# Patient Record
Sex: Male | Born: 1970
Health system: Southern US, Community
[De-identification: ages and names within clinical notes are randomized; demographics above are authoritative.]

## PROBLEM LIST (undated history)

## (undated) DIAGNOSIS — Z9114 Patient's other noncompliance with medication regimen: Secondary | ICD-10-CM

## (undated) DIAGNOSIS — B1920 Unspecified viral hepatitis C without hepatic coma: Secondary | ICD-10-CM

## (undated) DIAGNOSIS — F319 Bipolar disorder, unspecified: Secondary | ICD-10-CM

## (undated) DIAGNOSIS — F419 Anxiety disorder, unspecified: Secondary | ICD-10-CM

## (undated) DIAGNOSIS — F32A Depression, unspecified: Secondary | ICD-10-CM

## (undated) DIAGNOSIS — K219 Gastro-esophageal reflux disease without esophagitis: Secondary | ICD-10-CM

## (undated) DIAGNOSIS — R000 Tachycardia, unspecified: Secondary | ICD-10-CM

## (undated) DIAGNOSIS — Z91148 Patient's other noncompliance with medication regimen for other reason: Secondary | ICD-10-CM

## (undated) DIAGNOSIS — R569 Unspecified convulsions: Secondary | ICD-10-CM

## (undated) DIAGNOSIS — K529 Noninfective gastroenteritis and colitis, unspecified: Secondary | ICD-10-CM

## (undated) DIAGNOSIS — B86 Scabies: Secondary | ICD-10-CM

## (undated) DIAGNOSIS — F329 Major depressive disorder, single episode, unspecified: Secondary | ICD-10-CM

## (undated) DIAGNOSIS — F1910 Other psychoactive substance abuse, uncomplicated: Secondary | ICD-10-CM

## (undated) DIAGNOSIS — G4733 Obstructive sleep apnea (adult) (pediatric): Secondary | ICD-10-CM

## (undated) HISTORY — DX: Unspecified viral hepatitis C without hepatic coma: B19.20

## (undated) HISTORY — PX: NECK SURGERY: SHX720

## (undated) HISTORY — DX: Bipolar disorder, unspecified: F31.9

## (undated) HISTORY — PX: BURN TREATMENT: SHX158

## (undated) HISTORY — PX: SHOULDER ARTHROSCOPY: SHX128

## (undated) HISTORY — PX: OTHER SURGICAL HISTORY: SHX169

## (undated) HISTORY — DX: Scabies: B86

## (undated) HISTORY — DX: Obstructive sleep apnea (adult) (pediatric): G47.33

## (undated) HISTORY — PX: SHOULDER SURGERY: SHX246

---

## 2000-12-06 ENCOUNTER — Emergency Department (HOSPITAL_COMMUNITY): Admission: EM | Admit: 2000-12-06 | Discharge: 2000-12-06 | Payer: Self-pay | Admitting: Emergency Medicine

## 2001-12-01 ENCOUNTER — Ambulatory Visit (HOSPITAL_COMMUNITY): Admission: RE | Admit: 2001-12-01 | Discharge: 2001-12-01 | Payer: Self-pay | Admitting: Pediatrics

## 2001-12-23 ENCOUNTER — Other Ambulatory Visit: Admission: RE | Admit: 2001-12-23 | Discharge: 2001-12-23 | Payer: Self-pay | Admitting: *Deleted

## 2001-12-23 ENCOUNTER — Encounter (INDEPENDENT_AMBULATORY_CARE_PROVIDER_SITE_OTHER): Payer: Self-pay | Admitting: Specialist

## 2003-06-01 ENCOUNTER — Encounter: Payer: Self-pay | Admitting: *Deleted

## 2003-06-01 ENCOUNTER — Emergency Department (HOSPITAL_COMMUNITY): Admission: EM | Admit: 2003-06-01 | Discharge: 2003-06-02 | Payer: Self-pay | Admitting: *Deleted

## 2004-01-06 ENCOUNTER — Ambulatory Visit (HOSPITAL_COMMUNITY): Admission: RE | Admit: 2004-01-06 | Discharge: 2004-01-06 | Payer: Self-pay | Admitting: Internal Medicine

## 2004-02-15 ENCOUNTER — Inpatient Hospital Stay (HOSPITAL_COMMUNITY): Admission: RE | Admit: 2004-02-15 | Discharge: 2004-02-19 | Payer: Self-pay | Admitting: Psychiatry

## 2004-02-20 ENCOUNTER — Other Ambulatory Visit (HOSPITAL_COMMUNITY): Admission: RE | Admit: 2004-02-20 | Discharge: 2004-02-29 | Payer: Self-pay | Admitting: Psychiatry

## 2004-03-22 ENCOUNTER — Emergency Department (HOSPITAL_COMMUNITY): Admission: EM | Admit: 2004-03-22 | Discharge: 2004-03-22 | Payer: Self-pay | Admitting: Emergency Medicine

## 2004-10-04 ENCOUNTER — Emergency Department (HOSPITAL_COMMUNITY): Admission: EM | Admit: 2004-10-04 | Discharge: 2004-10-05 | Payer: Self-pay | Admitting: Emergency Medicine

## 2005-05-08 ENCOUNTER — Emergency Department (HOSPITAL_COMMUNITY): Admission: EM | Admit: 2005-05-08 | Discharge: 2005-05-08 | Payer: Self-pay | Admitting: Emergency Medicine

## 2005-05-28 ENCOUNTER — Emergency Department (HOSPITAL_COMMUNITY): Admission: EM | Admit: 2005-05-28 | Discharge: 2005-05-28 | Payer: Self-pay | Admitting: Emergency Medicine

## 2005-11-10 ENCOUNTER — Emergency Department (HOSPITAL_COMMUNITY): Admission: EM | Admit: 2005-11-10 | Discharge: 2005-11-10 | Payer: Self-pay | Admitting: Emergency Medicine

## 2006-04-26 ENCOUNTER — Emergency Department (HOSPITAL_COMMUNITY): Admission: EM | Admit: 2006-04-26 | Discharge: 2006-04-26 | Payer: Self-pay | Admitting: Emergency Medicine

## 2006-08-18 ENCOUNTER — Emergency Department (HOSPITAL_COMMUNITY): Admission: EM | Admit: 2006-08-18 | Discharge: 2006-08-18 | Payer: Self-pay | Admitting: Emergency Medicine

## 2007-03-04 ENCOUNTER — Emergency Department (HOSPITAL_COMMUNITY): Admission: EM | Admit: 2007-03-04 | Discharge: 2007-03-04 | Payer: Self-pay | Admitting: Emergency Medicine

## 2008-03-16 ENCOUNTER — Emergency Department (HOSPITAL_COMMUNITY): Admission: EM | Admit: 2008-03-16 | Discharge: 2008-03-17 | Payer: Self-pay | Admitting: Emergency Medicine

## 2008-03-16 ENCOUNTER — Encounter: Payer: Self-pay | Admitting: Orthopedic Surgery

## 2008-03-30 ENCOUNTER — Ambulatory Visit: Payer: Self-pay | Admitting: Orthopedic Surgery

## 2008-03-30 DIAGNOSIS — M25519 Pain in unspecified shoulder: Secondary | ICD-10-CM | POA: Insufficient documentation

## 2008-05-13 ENCOUNTER — Emergency Department (HOSPITAL_COMMUNITY): Admission: EM | Admit: 2008-05-13 | Discharge: 2008-05-13 | Payer: Self-pay | Admitting: Emergency Medicine

## 2008-05-20 ENCOUNTER — Emergency Department (HOSPITAL_COMMUNITY): Admission: EM | Admit: 2008-05-20 | Discharge: 2008-05-20 | Payer: Self-pay | Admitting: Emergency Medicine

## 2008-08-05 ENCOUNTER — Ambulatory Visit (HOSPITAL_COMMUNITY): Admission: RE | Admit: 2008-08-05 | Discharge: 2008-08-05 | Payer: Self-pay | Admitting: Orthopaedic Surgery

## 2008-09-06 ENCOUNTER — Ambulatory Visit (HOSPITAL_COMMUNITY): Admission: RE | Admit: 2008-09-06 | Discharge: 2008-09-06 | Payer: Self-pay | Admitting: Orthopaedic Surgery

## 2008-11-15 ENCOUNTER — Emergency Department (HOSPITAL_COMMUNITY): Admission: EM | Admit: 2008-11-15 | Discharge: 2008-11-15 | Payer: Self-pay | Admitting: Emergency Medicine

## 2009-01-01 ENCOUNTER — Emergency Department (HOSPITAL_COMMUNITY): Admission: EM | Admit: 2009-01-01 | Discharge: 2009-01-01 | Payer: Self-pay | Admitting: Emergency Medicine

## 2009-04-16 ENCOUNTER — Emergency Department (HOSPITAL_COMMUNITY): Admission: EM | Admit: 2009-04-16 | Discharge: 2009-04-16 | Payer: Self-pay | Admitting: Emergency Medicine

## 2010-05-15 ENCOUNTER — Emergency Department (HOSPITAL_COMMUNITY)
Admission: EM | Admit: 2010-05-15 | Discharge: 2010-05-15 | Payer: Self-pay | Source: Home / Self Care | Admitting: Emergency Medicine

## 2010-11-13 ENCOUNTER — Ambulatory Visit (HOSPITAL_COMMUNITY)
Admission: RE | Admit: 2010-11-13 | Discharge: 2010-11-13 | Payer: Self-pay | Source: Home / Self Care | Attending: Family Medicine | Admitting: Family Medicine

## 2011-01-06 ENCOUNTER — Inpatient Hospital Stay (HOSPITAL_COMMUNITY)
Admission: EM | Admit: 2011-01-06 | Discharge: 2011-01-15 | DRG: 918 | Disposition: A | Discharge status: Other Acute Inpatient Hospital | Payer: MEDICARE | Attending: Internal Medicine | Admitting: Internal Medicine

## 2011-01-06 ENCOUNTER — Emergency Department (HOSPITAL_COMMUNITY): Payer: MEDICARE

## 2011-01-06 DIAGNOSIS — F17200 Nicotine dependence, unspecified, uncomplicated: Secondary | ICD-10-CM | POA: Diagnosis present

## 2011-01-06 DIAGNOSIS — F319 Bipolar disorder, unspecified: Secondary | ICD-10-CM | POA: Diagnosis present

## 2011-01-06 DIAGNOSIS — M5020 Other cervical disc displacement, unspecified cervical region: Secondary | ICD-10-CM | POA: Diagnosis present

## 2011-01-06 DIAGNOSIS — G40909 Epilepsy, unspecified, not intractable, without status epilepticus: Secondary | ICD-10-CM | POA: Diagnosis present

## 2011-01-06 DIAGNOSIS — T426X1A Poisoning by other antiepileptic and sedative-hypnotic drugs, accidental (unintentional), initial encounter: Principal | ICD-10-CM | POA: Diagnosis present

## 2011-01-06 DIAGNOSIS — R269 Unspecified abnormalities of gait and mobility: Secondary | ICD-10-CM | POA: Diagnosis present

## 2011-01-06 DIAGNOSIS — E870 Hyperosmolality and hypernatremia: Secondary | ICD-10-CM | POA: Diagnosis not present

## 2011-01-06 DIAGNOSIS — D6959 Other secondary thrombocytopenia: Secondary | ICD-10-CM | POA: Diagnosis present

## 2011-01-06 LAB — COMPREHENSIVE METABOLIC PANEL
ALT: 98 U/L — ABNORMAL HIGH (ref 0–53)
AST: 62 U/L — ABNORMAL HIGH (ref 0–37)
Albumin: 3.5 g/dL (ref 3.5–5.2)
Alkaline Phosphatase: 60 U/L (ref 39–117)
BUN: 11 mg/dL (ref 6–23)
Calcium: 7.8 mg/dL — ABNORMAL LOW (ref 8.4–10.5)
Chloride: 108 mEq/L (ref 96–112)
Creatinine, Ser: 1.31 mg/dL (ref 0.4–1.5)
Glucose, Bld: 84 mg/dL (ref 70–99)
Sodium: 141 mEq/L (ref 135–145)
Total Bilirubin: 0.8 mg/dL (ref 0.3–1.2)
Total Protein: 6.2 g/dL (ref 6.0–8.3)

## 2011-01-06 LAB — CBC
MCH: 27.7 pg (ref 26.0–34.0)
MCV: 82.3 fL (ref 78.0–100.0)
RBC: 4.29 MIL/uL (ref 4.22–5.81)
WBC: 1.8 10*3/uL — ABNORMAL LOW (ref 4.0–10.5)

## 2011-01-06 LAB — DIFFERENTIAL
Basophils Absolute: 0 10*3/uL (ref 0.0–0.1)
Basophils Relative: 1 % (ref 0–1)
Lymphocytes Relative: 45 % (ref 12–46)
Lymphs Abs: 0.8 10*3/uL (ref 0.7–4.0)
Monocytes Absolute: 0 10*3/uL — ABNORMAL LOW (ref 0.1–1.0)
Monocytes Relative: 2 % — ABNORMAL LOW (ref 3–12)

## 2011-01-07 ENCOUNTER — Inpatient Hospital Stay (HOSPITAL_COMMUNITY): Payer: MEDICARE

## 2011-01-07 LAB — COMPREHENSIVE METABOLIC PANEL
Albumin: 3.1 g/dL — ABNORMAL LOW (ref 3.5–5.2)
BUN: 11 mg/dL (ref 6–23)
CO2: 22 mEq/L (ref 19–32)
Chloride: 115 mEq/L — ABNORMAL HIGH (ref 96–112)
GFR calc Af Amer: >60 mL/min (ref >60)
GFR calc non Af Amer: >60 mL/min (ref >60)
Glucose, Bld: 97 mg/dL (ref 70–99)
Potassium: 3.5 mEq/L (ref 3.5–5.1)
Sodium: 149 mEq/L — ABNORMAL HIGH (ref 135–145)
Total Protein: 5.6 g/dL — ABNORMAL LOW (ref 6.0–8.3)

## 2011-01-07 LAB — AMMONIA: Ammonia: 110 umol/L — ABNORMAL HIGH (ref 11–35)

## 2011-01-07 LAB — DIFFERENTIAL
Basophils Absolute: 0 10*3/uL (ref 0.0–0.1)
Basophils Relative: 0 % (ref 0–1)
Eosinophils Absolute: 0 10*3/uL (ref 0.0–0.7)
Eosinophils Relative: 0 % (ref 0–5)
Lymphs Abs: 0.7 10*3/uL (ref 0.7–4.0)

## 2011-01-07 LAB — PROTIME-INR
INR: 1.34 (ref 0.00–1.49)
Prothrombin Time: 16.8 seconds — ABNORMAL HIGH (ref 11.6–15.2)

## 2011-01-07 LAB — MRSA PCR SCREENING: MRSA by PCR: NEGATIVE

## 2011-01-07 LAB — VALPROIC ACID LEVEL
Valproic Acid Lvl: 373.5 ug/mL (ref 50.0–100.0)
Valproic Acid Lvl: 207.4 ug/mL (ref 50.0–100.0)

## 2011-01-07 LAB — CBC
Hemoglobin: 12.6 g/dL — ABNORMAL LOW (ref 13.0–17.0)
MCHC: 33.8 g/dL (ref 30.0–36.0)
RBC: 4.49 MIL/uL (ref 4.22–5.81)
WBC: 1.4 10*3/uL — CL (ref 4.0–10.5)

## 2011-01-07 LAB — TSH: TSH: 0.341 u[IU]/mL — ABNORMAL LOW (ref 0.350–4.500)

## 2011-01-08 LAB — CBC
MCV: 82.3 fL (ref 78.0–100.0)
Platelets: 103 10*3/uL — ABNORMAL LOW (ref 150–400)
RDW: 13.5 % (ref 11.5–15.5)
WBC: 1.7 10*3/uL — ABNORMAL LOW (ref 4.0–10.5)

## 2011-01-08 LAB — DIFFERENTIAL
Basophils Relative: 0 % (ref 0–1)
Eosinophils Absolute: 0 10*3/uL (ref 0.0–0.7)
Eosinophils Relative: 0 % (ref 0–5)
Lymphs Abs: 0.6 10*3/uL — ABNORMAL LOW (ref 0.7–4.0)

## 2011-01-08 LAB — COMPREHENSIVE METABOLIC PANEL
Albumin: 2.9 g/dL — ABNORMAL LOW (ref 3.5–5.2)
Alkaline Phosphatase: 46 U/L (ref 39–117)
BUN: 8 mg/dL (ref 6–23)
Creatinine, Ser: 1.03 mg/dL (ref 0.4–1.5)
Potassium: 3.1 mEq/L — ABNORMAL LOW (ref 3.5–5.1)
Total Protein: 4.9 g/dL — ABNORMAL LOW (ref 6.0–8.3)

## 2011-01-09 ENCOUNTER — Inpatient Hospital Stay (HOSPITAL_COMMUNITY): Payer: MEDICARE

## 2011-01-09 ENCOUNTER — Inpatient Hospital Stay (HOSPITAL_COMMUNITY): Admit: 2011-01-09 | Discharge: 2011-01-09 | Disposition: A | Discharge status: Home / Self Care | Payer: MEDICARE

## 2011-01-09 LAB — DIFFERENTIAL
Lymphs Abs: 1.2 10*3/uL (ref 0.7–4.0)
Monocytes Relative: 4 % (ref 3–12)
Neutro Abs: 1.9 10*3/uL (ref 1.7–7.7)
Neutrophils Relative %: 59 % (ref 43–77)

## 2011-01-09 LAB — CBC
HCT: 33.1 % — ABNORMAL LOW (ref 39.0–52.0)
Hemoglobin: 11.2 g/dL — ABNORMAL LOW (ref 13.0–17.0)
MCH: 27.8 pg (ref 26.0–34.0)
MCV: 82.1 fL (ref 78.0–100.0)
RBC: 4.03 MIL/uL — ABNORMAL LOW (ref 4.22–5.81)

## 2011-01-09 LAB — COMPREHENSIVE METABOLIC PANEL
AST: 38 U/L — ABNORMAL HIGH (ref 0–37)
Alkaline Phosphatase: 52 U/L (ref 39–117)
BUN: 15 mg/dL (ref 6–23)
CO2: 24 mEq/L (ref 19–32)
Chloride: 114 mEq/L — ABNORMAL HIGH (ref 96–112)
Creatinine, Ser: 1.08 mg/dL (ref 0.4–1.5)

## 2011-01-09 LAB — VALPROIC ACID LEVEL: Valproic Acid Lvl: 45 ug/mL — ABNORMAL LOW (ref 50.0–100.0)

## 2011-01-10 DIAGNOSIS — F39 Unspecified mood [affective] disorder: Secondary | ICD-10-CM

## 2011-01-10 LAB — DIFFERENTIAL
Eosinophils Absolute: 0 10*3/uL (ref 0.0–0.7)
Eosinophils Relative: 0 % (ref 0–5)
Lymphocytes Relative: 19 % (ref 12–46)
Lymphs Abs: 0.5 10*3/uL — ABNORMAL LOW (ref 0.7–4.0)
Monocytes Absolute: 0.1 10*3/uL (ref 0.1–1.0)

## 2011-01-10 LAB — COMPREHENSIVE METABOLIC PANEL
ALT: 72 U/L — ABNORMAL HIGH (ref 0–53)
AST: 39 U/L — ABNORMAL HIGH (ref 0–37)
Albumin: 3.2 g/dL — ABNORMAL LOW (ref 3.5–5.2)
Calcium: 9 mg/dL (ref 8.4–10.5)
Chloride: 113 mEq/L — ABNORMAL HIGH (ref 96–112)
Creatinine, Ser: 0.92 mg/dL (ref 0.4–1.5)
GFR calc Af Amer: >60 mL/min (ref >60)
Sodium: 144 mEq/L (ref 135–145)
Total Bilirubin: 0.6 mg/dL (ref 0.3–1.2)

## 2011-01-10 LAB — AMMONIA: Ammonia: 35 umol/L (ref 11–35)

## 2011-01-10 LAB — CBC
HCT: 37.7 % — ABNORMAL LOW (ref 39.0–52.0)
MCHC: 34 g/dL (ref 30.0–36.0)
MCV: 78.7 fL (ref 78.0–100.0)
RDW: 13.3 % (ref 11.5–15.5)

## 2011-01-11 LAB — BASIC METABOLIC PANEL
Chloride: 108 mEq/L (ref 96–112)
Creatinine, Ser: 0.9 mg/dL (ref 0.4–1.5)
GFR calc Af Amer: >60 mL/min (ref >60)
GFR calc non Af Amer: >60 mL/min (ref >60)
Potassium: 4.1 mEq/L (ref 3.5–5.1)

## 2011-01-11 LAB — DIFFERENTIAL
Eosinophils Relative: 0 % (ref 0–5)
Lymphocytes Relative: 13 % (ref 12–46)
Lymphs Abs: 0.8 10*3/uL (ref 0.7–4.0)
Monocytes Absolute: 0.2 10*3/uL (ref 0.1–1.0)
Monocytes Relative: 3 % (ref 3–12)

## 2011-01-11 LAB — CBC
HCT: 34.9 % — ABNORMAL LOW (ref 39.0–52.0)
MCH: 26.7 pg (ref 26.0–34.0)
MCHC: 33.8 g/dL (ref 30.0–36.0)
MCV: 79 fL (ref 78.0–100.0)
RDW: 13.2 % (ref 11.5–15.5)

## 2011-01-11 LAB — HEPATIC FUNCTION PANEL
Albumin: 3.1 g/dL — ABNORMAL LOW (ref 3.5–5.2)
Alkaline Phosphatase: 52 U/L (ref 39–117)
Total Protein: 5.8 g/dL — ABNORMAL LOW (ref 6.0–8.3)

## 2011-01-11 LAB — VALPROIC ACID LEVEL: Valproic Acid Lvl: 113 ug/mL — ABNORMAL HIGH (ref 50.0–100.0)

## 2011-01-12 LAB — DIFFERENTIAL
Basophils Absolute: 0 10*3/uL (ref 0.0–0.1)
Eosinophils Relative: 0 % (ref 0–5)
Lymphocytes Relative: 11 % — ABNORMAL LOW (ref 12–46)
Lymphs Abs: 1 10*3/uL (ref 0.7–4.0)
Neutro Abs: 8.1 10*3/uL — ABNORMAL HIGH (ref 1.7–7.7)

## 2011-01-12 LAB — COMPREHENSIVE METABOLIC PANEL
ALT: 50 U/L (ref 0–53)
Alkaline Phosphatase: 55 U/L (ref 39–117)
BUN: 18 mg/dL (ref 6–23)
CO2: 27 mEq/L (ref 19–32)
Chloride: 106 mEq/L (ref 96–112)
GFR calc non Af Amer: >60 mL/min (ref >60)
Glucose, Bld: 136 mg/dL — ABNORMAL HIGH (ref 70–99)
Potassium: 3.6 mEq/L (ref 3.5–5.1)
Sodium: 143 mEq/L (ref 135–145)
Total Bilirubin: 0.6 mg/dL (ref 0.3–1.2)

## 2011-01-12 LAB — CBC
HCT: 36.9 % — ABNORMAL LOW (ref 39.0–52.0)
Hemoglobin: 12.2 g/dL — ABNORMAL LOW (ref 13.0–17.0)
MCV: 82.2 fL (ref 78.0–100.0)
RBC: 4.49 MIL/uL (ref 4.22–5.81)
RDW: 13.1 % (ref 11.5–15.5)
WBC: 9.5 10*3/uL (ref 4.0–10.5)

## 2011-01-13 LAB — DIFFERENTIAL
Basophils Absolute: 0 10*3/uL (ref 0.0–0.1)
Basophils Relative: 0 % (ref 0–1)
Eosinophils Absolute: 0 10*3/uL (ref 0.0–0.7)
Monocytes Relative: 7 % (ref 3–12)
Neutro Abs: 7.3 10*3/uL (ref 1.7–7.7)
Neutrophils Relative %: 78 % — ABNORMAL HIGH (ref 43–77)

## 2011-01-13 LAB — CBC
Hemoglobin: 13.3 g/dL (ref 13.0–17.0)
MCH: 27.2 pg (ref 26.0–34.0)
MCHC: 33.1 g/dL (ref 30.0–36.0)
Platelets: 101 10*3/uL — ABNORMAL LOW (ref 150–400)
RBC: 4.89 MIL/uL (ref 4.22–5.81)

## 2011-01-15 ENCOUNTER — Inpatient Hospital Stay (HOSPITAL_COMMUNITY)
Admission: AD | Admit: 2011-01-15 | Discharge: 2011-01-18 | DRG: 881 | Disposition: A | Discharge status: Home / Self Care | Payer: MEDICARE | Source: Ambulatory Visit | Attending: Psychiatry | Admitting: Psychiatry

## 2011-01-15 DIAGNOSIS — M542 Cervicalgia: Secondary | ICD-10-CM

## 2011-01-15 DIAGNOSIS — F1910 Other psychoactive substance abuse, uncomplicated: Secondary | ICD-10-CM

## 2011-01-15 DIAGNOSIS — G40909 Epilepsy, unspecified, not intractable, without status epilepticus: Secondary | ICD-10-CM

## 2011-01-15 DIAGNOSIS — T50992A Poisoning by other drugs, medicaments and biological substances, intentional self-harm, initial encounter: Secondary | ICD-10-CM

## 2011-01-15 DIAGNOSIS — F329 Major depressive disorder, single episode, unspecified: Principal | ICD-10-CM

## 2011-01-15 DIAGNOSIS — R269 Unspecified abnormalities of gait and mobility: Secondary | ICD-10-CM

## 2011-01-15 DIAGNOSIS — G959 Disease of spinal cord, unspecified: Secondary | ICD-10-CM

## 2011-01-15 DIAGNOSIS — Z560 Unemployment, unspecified: Secondary | ICD-10-CM

## 2011-01-15 DIAGNOSIS — T426X1A Poisoning by other antiepileptic and sedative-hypnotic drugs, accidental (unintentional), initial encounter: Secondary | ICD-10-CM

## 2011-01-15 DIAGNOSIS — D72819 Decreased white blood cell count, unspecified: Secondary | ICD-10-CM

## 2011-01-15 DIAGNOSIS — F3289 Other specified depressive episodes: Principal | ICD-10-CM

## 2011-01-15 LAB — CBC
Hemoglobin: 12.8 g/dL — ABNORMAL LOW (ref 13.0–17.0)
Platelets: 95 10*3/uL — ABNORMAL LOW (ref 150–400)
RBC: 4.64 MIL/uL (ref 4.22–5.81)
WBC: 8.7 10*3/uL (ref 4.0–10.5)

## 2011-01-15 LAB — COMPREHENSIVE METABOLIC PANEL
ALT: 31 U/L (ref 0–53)
AST: 16 U/L (ref 0–37)
Albumin: 2.9 g/dL — ABNORMAL LOW (ref 3.5–5.2)
Alkaline Phosphatase: 45 U/L (ref 39–117)
CO2: 27 mEq/L (ref 19–32)
Chloride: 100 mEq/L (ref 96–112)
GFR calc Af Amer: >60 mL/min (ref >60)
GFR calc non Af Amer: >60 mL/min (ref >60)
Potassium: 4 mEq/L (ref 3.5–5.1)
Total Bilirubin: 0.3 mg/dL (ref 0.3–1.2)

## 2011-01-15 LAB — DIFFERENTIAL
Basophils Relative: 1 % (ref 0–1)
Eosinophils Absolute: 0 10*3/uL (ref 0.0–0.7)
Lymphs Abs: 1.8 10*3/uL (ref 0.7–4.0)
Monocytes Relative: 9 % (ref 3–12)
Neutro Abs: 6 10*3/uL (ref 1.7–7.7)
Neutrophils Relative %: 69 % (ref 43–77)

## 2011-01-16 DIAGNOSIS — F319 Bipolar disorder, unspecified: Secondary | ICD-10-CM

## 2011-01-19 NOTE — H&P (Signed)
[name], [name] NO.:  [id number]  MEDICAL RECORD NO.:  [id number]           PATIENT TYPE:  LOCATION:                                 FACILITY:  PHYSICIAN:  Houston Siren, MD           DATE OF BIRTH:  June 05, 1971  DATE OF ADMISSION: DATE OF DISCHARGE:  LH                             HISTORY & PHYSICAL   PRIMARY CARE PHYSICIAN:  Unassigned to hospitalist.  ADVANCE DIRECTIVE:  Full code.  REASON FOR ADMISSION:  Overdose on valproic acid.  HISTORY OF PRESENT ILLNESS:  This is a 40 year old male with history of bipolar disorder, epilepsy, prior narcotic use, on Depakote 500 mg q.i.d. found by his wife unarousable.  There was no reported history of seizure or any suicidal ideation.  She count  his bottle and found 30-40 Depakote 500 mg pills missing.  She did not think that he was actively suicidal, and he has no prior indication of any ideation.  I spoke with her about his past seizures, and she said that he had one about 1- 1/2 month ago.  He had no prior suicide attempt that she knows of and that they had been married for 9 years.  They have no children. 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  Workup in the emergency room included white count of 1800 , hemoglobin of 11.9, creatinine of 1.3, alcohol level less than 5, salicylic less than 4.  Nursing report stated that he was intermittently agitated, and CT scan of the head was not able to be performed because of his agitation.  His valproic level came back greater than 150.  His EKG shows sinus rhythm at about 100 with elevated QT interval.  His wife said that he does not use any drugs at this time.  He has history of known prior drug use.  PAST MEDICAL HISTORY:  Seizure disorder, bipolar disorder, orthopedic problems, multisubstance abuse in the past.  ALLERGIES:  No known drug allergies.  CURRENT MEDICATIONS:  Depakote 500 mg q.i.d.  REVIEW OF SYSTEMS:   Unable.  PHYSICAL EXAM:  VITAL SIGNS:  Blood pressure 110/61, pulse of 90, respiratory rate of 20, temperature 97.5.  He is well sedated, but able to maintain his oral airway.  His respiration was steady and strong. HEENT:  Pupils equal, round, reactive to light. CARDIAC:  Revealed S1-S2 regular.  No murmur, rub, or gallop. LUNGS:  Clear with no wheezes, rales, or any evidence of consolidation. ABDOMEN:  Soft, nontender, nondistended. EXTREMITIES:  No edema.  There is no tremor.  He was able to move all his 4 extremities. SKIN:  Warm and dry. PSYCHIATRIC:  At time, he wake up agitated.  OBJECTIVE FINDINGS:  Alcohol level less than 5.  Salicylic acid level less than 4.  Tylenol level less than 10.  Valproic greater than 150. White count of 1800, hemoglobin of 11.9, MCV of 82, platelet count of 139,000, creatinine of 1.3, BUN of 11, glucose of 84.  Liver function tests are all normal.  IMPRESSION:  This is a 40 year old with history  of bipolar and epilepsy, presenting with valproic acid overdose.  I am unclear why he would take40 tablets, and I am suspicious that it is a suicidal attempt.  We will admit him to the step-down.  We will give him supportive treatment and monitor his hemodynamics.  He is currently able to maintain his airway.  Obviously, we will hold his Depakote.  I suspect he is getting leukopenia from this medication. In any event, he may not be able to tolerate this medication in a long-term. He is a full code and will be admitted to [facility] team #1.  He will need psych consult prior to discharge to determine of potential suicidiality.  We will give him intravenous fluids.  I would like to check a drug screen as well.     Houston Siren, MD     PL/MEDQ  D:  01/06/2011  T:  01/06/2011  Job:  [id number]  Electronically Signed by Houston Siren  on 01/19/2011 06:13:15 AM

## 2011-01-20 NOTE — H&P (Signed)
Francis [name], Francis [name]             ACCOUNT NO.:  [id number]  MEDICAL RECORD NO.:  [id number]           PATIENT TYPE:  I  LOCATION:  0301                          FACILITY:  [facility]  PHYSICIAN:  Anselm Jungling, MD  DATE OF BIRTH:  [birth year]  DATE OF ADMISSION:  01/15/2011 DATE OF DISCHARGE:                      PSYCHIATRIC ADMISSION ASSESSMENT   DATE/TIME OF ASSESSMENT:  January 16, 2011, at 8:15 a.m.  IDENTIFYING INFORMATION:  A 40 year old male.  This is a voluntary admission.  HISTORY OF PRESENT ILLNESS:  This is the second [facility] admission for Francis [name], a married 40 year old who was found unresponsive by his wife and admitted to our medical unit on January 06, 2011.  She found 30 to 40 Depakote 500 mg missing from his supply.  He was medically stabilized. His initial valproate level came back at greater than 150.  Also showed some leukopenia, which was attributed to the use of Depakote.  He reports he has had a longstanding history of epilepsy and has been prescribed Depakote for many years.  Please see the discharge summary by Dr. Eda Paschal dictated on March 16.  He was transferred to the psychiatric unit for further observation on March 20.  He presents today fully alert, in full contact with reality, denying any active suicidal thoughts, and seems to have little insight into why he may have taken an overdose.  He does not remember specific suicidal thoughts.  He does endorse having a lot going on in his mind, a history of mood disorder, and feels that frequently he has thoughts that he cannot stop.  He reports that he had been off of his Depakote for many months and in fact was having periodic seizures with the last one in December 2012 when he struck his neck and now has issues with cervical cord compression, C5-C6.  PAST PSYCHIATRIC HISTORY:  Previous admission in 2005 for opiate dependence.  He had been abusing OxyContin for 2 to 3 years, crushing and snorting it.  Stabilized at  that time on trazodone 50 mg 1 to 2 h.s. p.r.n. for sleep and Risperdal 0.5 mg p.o. t.i.d.  He has denied any current substance abuse.  Had previously seen Dr. Thomasena Edis at [facility] in [place], [place].  SOCIAL HISTORY:  Has an 14 year old son who currently lives with his mother.  The patient is currently married, has a supportive wife.  Does not work, is on disability for mental illness.  FAMILY HISTORY:  Denies family history of mental illness or substance abuse.  MEDICAL HISTORY:  No regular primary care physician.  Current medical problems are C5-6 cord compression secondary to trauma in December 2011 during seizure.  Epilepsy, thrombocytopenia, and unstable gait secondary to cord compression.  Current medications at the time of transfer are Depakote 2000 mg p.o. daily, Decadron injection 10 mg twice daily, nicotine smoking cessation patch 14 mg, hydrocodone 1 tablet q.8 h p.r.n. for pain.  DRUG ALLERGIES:  NSAIDS, IBUPROFEN, NAPROXEN, AND ASPIRIN, FOR WHICH HEHAS A HISTORY OF HIVES.  PHYSICAL EXAMINATION:  Done in the emergency room and is noted in the record.  This is a slim-built Caucasian male with a  somewhat awkward gait.  Frequently will reach out to the walls and rails for guidance as he walks.  Admitting vital signs:  Temperature 98.5, pulse 98, respirations 20, blood pressure 123/70.  Most recent labs:  Valproate level drawn on the 20th that is at 152.0 ng/mL.  Liver enzymes are normal.  BUN 27, creatinine 0.99, random glucose 108.  Platelets have corrected to 95,000.  Hemoglobin 12.8, WBC 8.7.  He is fully alert, in no physical distress today.  MENTAL STATUS EXAM:  Fully alert male.  Good eye contact.  Pleasant, quiet manner.  Calm, cooperative.  Speech is normal, nonpressured.  Able to give a fairly coherent history.  Seems to have little insight into why he may have taken the overdose.  Denies any active suicidal thoughts today.  No  homicidal thoughts.  Working Statistician are intact.  Insight limited.  Judgment intact.  Impulse control intact.  AXIS I:  Rule out mood disorder NOS.  History of opiate abuse, in remission.  Rule out cocaine abuse. AXIS II:  Deferred. AXIS III:  Epilepsy.  Unstable gait secondary to C5-6 cord compression. AXIS IV:  Deferred. AXIS V:  Current 42, past year not known.  The plan is to voluntarily admit him with a goal of evaluating his mental status, improving his coping, and evaluating him for suicidality. We plan on getting in touch with his wife.  He is on our dual diagnosis unit, has followup evaluation with [facility] to evaluate surgical options for his cord compression.  Meanwhile, we have instructed him to wear the cervical collar, which he is consistently refusing to wear.  We have decreased his valproate to 500 mg b.i.d. today and 1500 mg total starting January 17, 2011, and we will recheck a valproate level in the morning, trough level.     Margaret A. Lorin Picket, N.P.   ______________________________ Anselm Jungling, MD    MAS/MEDQ  D:  01/16/2011  T:  01/16/2011  Job:  [id number]  Electronically Signed by Kari Baars N.P. on 01/18/2011 03:45:17 PM Electronically Signed by Geralyn Flash MD on 01/20/2011 11:38:40 AM

## 2011-01-20 NOTE — Discharge Summary (Signed)
NAMEDEWITTE, VANNICE             ACCOUNT NO.:  [id number]  MEDICAL RECORD NO.:  [id number]           PATIENT TYPE:  I  LOCATION:  0301                          FACILITY:  [facility]  PHYSICIAN:  Anselm Jungling, MD  DATE OF BIRTH:  1971/05/19  DATE OF ADMISSION:  01/15/2011 DATE OF DISCHARGE:  01/19/2011                              DISCHARGE SUMMARY   IDENTIFYING DATA AND REASON FOR ADMISSION:  This was an inpatient psychiatric admission for Francis [name], a 40 year old male, who was admitted because of overdose suicide gesture with his prescription Depakote. Please refer to the admission note for further details pertaining to the symptoms, circumstances and history that led to his hospitalization.  He was given an initial Axis I diagnosis of depressive disorder NOS and history of substance abuse NOS.  MEDICAL AND LABORATORY:  The patient had had an episode in December 2011 of seizure.  This apparently followed his abrupt cessation of Depakote therapy.  In the course of the seizure, he fell and injured his neck. At the time of his admission, he was being treated for a neck injury with a steroid taper which was continued through his stay.  At the time of discharge, there were plans in place for him to see a neurosurgical specialist on March 28th for followup.  Otherwise, there were no significant medical issues.  HOSPITAL COURSE:  The patient was admitted to the adult inpatient psychiatric service.  He presented as a well-nourished, normally- developed adult male who was pleasant, cooperative, and friendly.  He did not appear to be significantly depressed.  He admitted that he had had some depression and some suicidal impulses prior to admission and prior to his overdose.  He indicated that he regretted his overdose, and was glad that he survived it.  He had no further suicidal ideation, plan or intent at any time during his stay.  Although he brought with him a neck brace, he was  never really noted to be wearing it.  When asked about this, he indicated that it was not very comfortable.  He did not appear to be in severe pain from his neck injury, and he had reasonable mobility as well.  He participated in therapeutic groups and activities geared toward helping him acquire better coping skills, a better understanding of his underlying disorders and dynamics, and the development of a sound aftercare plan.  Although he admitted to being "familiar" with various recreational drugs, he did not admit to any substance abuse problem.  The patient's mood remained stable, and he was in fairly good spirits on the 5th hospital day, when he agreed with the undersigned and the treatment team that he was appropriate for discharge.  He agreed to the following aftercare plan.  AFTERCARE:  The patient was to follow up with Dr. Newell Coral at [facility] on March 28th at 4:30 p.m.  DISCHARGE MEDICATIONS: 1. Decadron 4 mg b.i.d. through March 24th, then 4 mg daily on March     25th and 26th. 2. Depakote 500 mg t.i.d. 3. Lidoderm 5% patch to neck daily.  DISCHARGE DIAGNOSES:  Axis I: Depressive disorder NOS.  Axis II: Deferred. Axis III: Neck injury. Axis IV: Stressors severe. Axis V: GAF on discharge 50.  The suicide risk assessment was completed upon discharge and he was felt to be at minimal risk.     Anselm Jungling, MD     SPB/MEDQ  D:  01/18/2011  T:  01/18/2011  Job:  [id number]  Electronically Signed by Geralyn Flash MD on 01/20/2011 11:38:42 AM

## 2011-02-01 NOTE — Discharge Summary (Signed)
NAMEBRODY, BONNEAU             ACCOUNT NO.:  [id number]  MEDICAL RECORD NO.:  [id number]           PATIENT TYPE:  I  LOCATION:  A340                          FACILITY:  [facility]  PHYSICIAN:  Elayjah Chaney I Hamzah Savoca, MD      DATE OF BIRTH:  Dec 31, 1970  DATE OF ADMISSION:  01/06/2011 DATE OF DISCHARGE:  03/16/2012LH                              DISCHARGE SUMMARY   DISCHARGE DIAGNOSES: 1. Depakote overdose. 2. History of bipolar disorder. 3. History of seizure. 4. Cord compression at C5-C6 with cervical cord edema with severe     central stenosis at C-5-C6 secondary to large left eccentric broad     base disk occlusion, most prominent in the left posterolateral     region.  Case discussed with Dr. Newell Coral and Dr. Gerilyn Pilgrim from     Neurology. 5. Thrombocytopenia thought to be secondary to the bone marrow toxic     effects of valproic. 6. Elevated LFTs, improving, thought to be secondary to the toxic     effect of Depakote. 7. Ongoing tobacco abuse. 8. Gait instability secondary to cervical cord compression with     spasticity and both of the Babinski is fine of both lower     extremities.  CONSULTATION: 1. Kofi A. Gerilyn Pilgrim, MD, Neurology consulted and Hewitt Shorts,     MD over phone consultation as we do not have a neurosurgeon at     [facility]. 2. Dr. Dayna Barker consulted over the phone.  PROCEDURES: 1. EEG normal recording in awake and sleeping. 2. Normal CT head without contrast. 3. MRI, C-spine, cervical cord edema at C5-C6 secondary to cord     compression from a neurosurgical consultation is recommended.     Severe central stenosis at C5-C6 secondary to large left eccentric     broad based disk occlusion, most prominent in the left     posterolateral region.  A disk material appears to extend into the     neural foramen producing severe left foramen stenosis.  HISTORY OF PRESENT ILLNESS:  This is a 40 year old male with a history of bipolar disorder, epilepsy on Depakote  500 mg p.o. daily found by his wife arousable.  The patient took almost 30-40 Depakote 500 mg missing. Wife did not think that patient was actively suicidal, has no prior indication of any ideation.  The patient presented with Depakote overdose.  His acetaminophen level was less than 10, alcohol level less than 5, valproic acid was more than 150 and the level reached up to 373.5.  The patient noticed to have leukopenia, unless it is thought to be secondary to bone marrow suppression.  His leukopenia improved to 6.5 and his platelet at the time being is 84.  We think this is secondary to bone marrow suppression of Depakote.  We suspect this will be improving within the next week. 1. History of seizure.  Seizure medication is started by Neurology as     discussed with Dr. Gerilyn Pilgrim.  It will be fair to resume Depakote.     His Depakote level at this time 113 and we suggest to repeat  Depakote level on January 13, 2011. 2. C-5-C6 cord compression.  Care discussed with Dr. Gerilyn Pilgrim and the     neurosurgeon Dr. Newell Coral where he suggested outpatient elective     neurosurgical discectomy.  The patient at that time being     neurologically have spasticity, mainly of his lower extremities.     Cranial nerve intact and extraocular movements are full.  Facial     muscle symmetrical.  Motor examination increased tone/spasticity of     the legs.  He had unsteady gait.  Appointment was made to see Dr.     Newell Coral, [facility], number [phone] on January 23, 2011 at 4:30 p.m. for discussing what is the patient's different     option.  As per discussion with Dr. Newell Coral, he suggested     stabilizing the patient's psych and adjustment and improving his     platelet.  We will prioritize his spinal cord compression at this     time and there is no deficient on his motor activities.  The     patient had suicidal risk.  The patient admitted, he was trying to     kill himself when he took 40  Depakote pills.  As per the patient,     he attempted multiple times since he was 13.  He admitted he has     taken powder and crack cocaine but he did not admit how much or how     long.  The patient admitted he lost interest on life and on sex.     Currently, I felt the patient will benefit from the behavior health     admission and assessment by a neurosurgeon rather than admit here     at internal medicine floor.  At the behavior health, the patient     needs to follow up his CBC every other day and follow his Depakote     level to assure Depakote therapeutic dose.  Also suggest the     patient may need to have follow up with the internal medicine team     covering [facility] there.  Case discussed with Dr. Gerilyn Pilgrim, case     discussed with Dr. Newell Coral and case discussed with Dr. Dayna Barker from     [facility] .     Fawaz Borquez Bosie Helper, MD     HIE/MEDQ  D:  01/11/2011  T:  01/11/2011  Job:  [id number]  cc:   Hewitt Shorts, M.D. Fax: [phone]  Kofi A. Gerilyn Pilgrim, M.D. Fax: [phone]  Electronically Signed by Ebony Cargo MD on 02/01/2011 05:08:13 AM

## 2011-02-04 NOTE — Progress Notes (Addendum)
  NAMEEPHRIAM, Francis [name]             ACCOUNT NO.:  [id number]  MEDICAL RECORD NO.:  [id number]           PATIENT TYPE:  A  LOCATION:  EE                           FACILITY:  [facility]  PHYSICIAN:  Francis [name], M.D. DATE OF BIRTH:  [birth year]  DATE OF PROCEDURE: DATE OF DISCHARGE:                                PROGRESS NOTE   No new complaints are reported.  He still has neck pain.  PHYSICAL EXAMINATION:  VITAL SIGNS:  Temperature 97.8, pulse 52, respirations 20, blood pressure 169/87. HEENT:  Head is normocephalic, atraumatic. NECK:  Supple. ABDOMEN:  Soft. EXTREMITIES:  No edema. MENTATION:  He is sleeping but easily arousable.  He is otherwise lucid and coherent.  Speech and language are normal.  Cranial nerve evaluation shows that visual fields are intact.  Extraocular movements are full. Facial muscle strength symmetric.  Motor examination again shows increased tone/spasticity of the legs.  He does have good strength both proximally and distally, however, on direct strength testing.  The patient had MRI.  The results were reviewed in person.  There is a large posterior lateral disk herniation at C5-C6 which is causing obliteration of the serosanguineous spinal fluid and spinal cord compression.  There is evidence of increased signal at that level and a spinal cord indicating radiographic evidence of myelopathy which correlates with the clinical features.  There is some edema at that level.  WBC 2.7, hemoglobin 12.8, platelet count is not up.  Vitamin B12 level 574, ammonia down to 35.  RPR nonreactive.  Actually the patient also did have an EEG which is fine.  ASSESSMENT: 1. Spinal cord compression, cervical myelopathy.  Case was discussed     with the hospitalist.  I did suggest urgent neurosurgical     evaluation.  I also discussed the case with the patient and the     need to get this taken care of sooner rather than later to prevent     pertinent neurological  complication, especially given that he has     seizures.  A collar has been given to be worn when he is out of the     bed.  The patient has been started on high-dose Decadron. 2. Seizures, he is restarted back on Depakote. 3. Thrombocytopenia likely due to the very high dose of Depakote.  I     suspect the platelet count should improve.  I would     continue with the current Depakote.     Francis [name] A. Gerilyn [name], M.D.     KAD/MEDQ  D:  01/10/2011  T:  01/10/2011  Job:  [id number]  Electronically Signed by Francis [name] M.D. on 02/04/2011 09:10:51 AM

## 2011-02-04 NOTE — Consult Note (Signed)
[name], Francis [name]             ACCOUNT NO.:  [id number]  MEDICAL RECORD NO.:  [id number]           PATIENT TYPE:  I  LOCATION:  A340                          FACILITY:  [facility]  PHYSICIAN:  Baine Decesare A. Gerilyn Pilgrim, M.D. DATE OF BIRTH:  1970/11/07  DATE OF CONSULTATION: DATE OF DISCHARGE:                                CONSULTATION   REASON FOR CONSULTATION:  Seizures, overdose of valproic acid amongst other issues.  This is a 40 year old white male who has a baseline history of bipolar disorder, epilepsy and previous narcotic abuse.  The patient was found unresponsive by his wife.  She noted that about 40 tablets of Depakote was missing.  The patient takes actually the Depakote ER 4 tablets at bedtime.  There was no indication of suicidal ideation before this happened.  The patient now admits to taking more of his medication that he wants to hurt himself.  He reports having suicidal ideation at the time this happened, indicated some impulsivity. He currently denies suicidal ideation, however, he reports that he has had impulse behavior like this before.  He has a longstanding history of seizures since the age of 40 years old, he previously was treated by the [facility] including the now retired Dr. Buzzy Han; however, he has not seen them in a long time where usually he is seeing Dr. Thad Ranger, again; however, he has not seen him for several years. Apparently, he is somewhat compliant with his medication, although not totally so.  It appears his last seizure was about a month and a half ago, previous to that was in December.  After the seizure in December, the patient fell and hit the back of the head and neck region.  Since then, he has had difficulty standing and shaking on standing.  There was also some mild neck pain and headache and the reason where he hurt his head.  PAST MEDICAL HISTORY:  Again, epilepsy/seizures, bipolar disorder, and polysubstance drug abuse.  The  patient was admitted with encephalopathy in 2005 due to crushing OxyContin.  ALLERGIES:  NONE KNOWN.  CURRENT MEDICATIONS:  Depakote 2 g a day.  REVIEW OF SYSTEMS:  As stated in HPI, otherwise negative.  PHYSICAL EXAMINATION:  GENERAL:  This is a thin pleasant man in no acute distress. VITAL SIGNS:  Temperature 98.4, pulse 62, respirations 18, blood pressure 129/72. HEENT EVALUATION:  No point tenderness is palpated over the spinal processes.  No evidence of trauma in the head and neck region. NECK:  Supple. ABDOMEN:  Soft. EXTREMITIES:  No peripheral edema. MENTATION:  Sleeping, but arousable.  He is oriented to person, place, year, month and his general medical condition, does speak elusively and is coherent. CRANIAL NERVE EVALUATION:  Pupils are equal, round, reactive to light. Visual fields are intact.  Extraocular movements are full.  Facial muscle strength is symmetric.  Tongue midline.  Uvula midline.  Shoulder shrugs normal.  Motor examination shows significantly increased tone in the legs, direct strength is good, however, upper extremity shows normal tone, bulk and strength.  Reflexes are pathologically brisk in the legs of extensor plantar responses bilaterally.  Reflexes are  normal in the upper extremities.  Coordination shows no dysmetria.  There are no tremors or past-pointing.  LABORATORY DATA:  Depakote level on admission was greater than 150.  It appears it was measured eventually and it was 375, repeat came down to 207 and 95, and the latest is 45 today.  Ammonia level 110.  The liver enzymes are somewhat elevated.  AST 62 is now down to 38 and ALT 98 now down to 63.  WBC 1.8 now up to 3.2, platelet count interestingly was 139 and now down to 92.  Thyroid stimulating hormone was 0.34.  Head CT negative.  ASSESSMENT: 1. Toxic metabolic encephalopathy which appears to be resolved and     rapidly with now normalized dose of Depakote. 2. Seizure  disorder/epilepsy which he has had for a long time. 3. History of polysubstance drug abuse. 4. Evidence of significant myelopathy on physical examination, I     suspect that the patient has a cord contusion from his injuries     after his seizure in December.  RECOMMENDATIONS: 1. The patient will be started on Depakote at the current dose before     he was hospitalized. 2. Workup for myelopathy, we will proceed with a cervical spine MRI     and also blood testing will be done for thyroid function tests,     vitamin B12, and homocysteine level.  Thank you for this consultation.     Kayona Foor A. Gerilyn Pilgrim, M.D.     KAD/MEDQ  D:  01/09/2011  T:  01/09/2011  Job:  [id number]  Electronically Signed by Beryle Beams M.D. on 02/04/2011 09:09:14 AM

## 2011-03-12 NOTE — Op Note (Signed)
NAMEHADY, NIEMCZYK             ACCOUNT NO.:  [id number]   MEDICAL RECORD NO.:  [id number]          PATIENT TYPE:  AMB   LOCATION:  SDS                          FACILITY:  [facility]   PHYSICIAN:  Vanita Panda. Magnus Ivan, M.D.DATE OF BIRTH:  December 17, 1970   DATE OF PROCEDURE:  09/06/2008  DATE OF DISCHARGE:  09/06/2008                               OPERATIVE REPORT   PREOPERATIVE DIAGNOSIS:  Chronic left shoulder pain with chronic  acromioclavicular joint separation.   POSTOPERATIVE DIAGNOSIS:  Chronic left shoulder pain with chronic  acromioclavicular joint separation.   PROCEDURE:  1. Left shoulder arthroscopy with debridement and subacromial      decompression.  2. Left open distal clavicle resection.   SURGEON:  Vanita Panda. Magnus Ivan, MD   ANESTHESIA:  1. Left shoulder regional block.  2. General.   BLOOD LOSS:  Minimal.   COMPLICATIONS:  None.   INDICATIONS:  Briefly, Mr. Beckley is a 40 year old male who sustained  injury to his left shoulder several years ago.  This was an AC  separation.  He was seen back in May 2009 in [facility] with  chronic shoulder pain and was told that he had a slight AC separation,  it looked to be at least type 1 or type 2.  He complained of severe  shoulder pain and was sent down here for further followup.  In my  office, I did inject the subacromial space and he got some relief of his  symptoms.  I recommended that he undergo an MRI.  The MRI did show that  there was a slight step-off at the [facility] joint, but minimal bursitis and  did see the coracoclavicular ligaments as the coracoclavicular ligaments  were intact.  After he failed conservative treatment, I recommend that  he undergo a left shoulder arthroscopy with debridement with subacromial  decompression and distal clavicle resection.  The Risks and benefits of  this were explained in length.  I told him that he was still having some  prominence of the clavicle, but I would  make sure that we create enough  space to avoid further impingement.  The risks and benefits of this were  explained and well understood.  He agreed to proceed with surgery.   PROCEDURE:  After informed consent was obtained, the appropriate left  shoulder was marked, anesthesia was obtained for shoulder block of the  left shoulder.  He was then brought to the operating room and placed  supine on the operating table.  General anesthesia was then obtained.  He was then fashioned in a beach chair position with appropriate padding  of the down nonoperative right arm, appropriate positioning of the head  and neck, and bending of the waist and knees.  His left shoulder was  then prepped and draped with DuraPrep and sterile drapes including  sterile stockinette.  His arm was then placed in in-line skeletal  traction with about 45 degrees of forward flexion.  A time-out was  called to identify the correct patient and correct left shoulder.  I  then made a posterolateral arthroscopy portal of the posterior lateral  edge of the acromion.  The glenohumeral joint was entered.  There was  minimal inflamed tissue in the glenohumeral joint.  I made an anterior  incision just lateral to the coracoid process and placed a probe inside  the shoulder.  I probed the rotator cuff and it was found to be intact.  The labrum was intact, anterior and posterior.  There was no evidence of  SLAP lesion.  The subscapularis tendon was intact as well.  Minimal  debridement was then carried out using the soft tissue ablation wand  with just some mild inflamed tissue on the undersurface of the rotator  cuff.  Next, I entered the subacromial space through the posterior  portal and you could see that there was significant bursitis in the  subacromial space.  A separate lateral incision was then made just off  the lateral edge of acromion.  A soft tissue ablation wand was inserted.  I carried out a debridement in the  subacromial space and cleaned of  bursitis.  There was abundant space between the rotator cuff and the  acromion itself.  I did find the distal clavicle and saw that there was  bone spurs and step-off of the joint and inflamed tissue all around this  area.  Once I further debrided this with an arthroscopic shaver and soft  tissue ablation wand, I to proceed with the open portion of the case.  All instrumentations were removed from the shoulder and I then made an  incision directly over the distal clavicle, this was a longitudinal  incision, I carried this down to the clavicle itself and divided the  tissue around the clavicle.  It was again found to be stable.  I then  could find the distal edge of the clavicle at the [facility] joint and used  oscillating saw to remove a few millimeters of the distal clavicle to  create a space.  I then thoroughly irrigated out this wound in the  shoulder, closed the deep tissue over the [facility] joint with interrupted 0  Vicryl suture followed by 2-0 Vicryl in the subcutaneous tissue, and  interrupted 3-0 nylon in all skin incisions.  Xeroform followed by well-  padded sterile dressing was applied and his arm was placed in a sling.  He was awakened, extubated, and taken to the recovery room in stable  condition.  All final counts were correct and there were no  complications noted.      Vanita Panda. Magnus Ivan, M.D.  Electronically Signed     CYB/MEDQ  D:  09/06/2008  T:  09/07/2008  Job:  [id number]

## 2011-03-15 NOTE — Op Note (Signed)
[facility]  Patient:    Francis [name], Francis [name] Visit Number: [id number] MRN: [id number]          Service Type: DSU Location: DAY Attending Physician:  Barbaraann Barthel Dictated by:   Barbaraann Barthel, M.D. Proc. Date: 12/01/01 Admit Date:  12/01/2001   CC:         Karleen Hampshire, M.D.   Operative Report  PREOPERATIVE DIAGNOSIS:  Right femoral hernia.  POSTOPERATIVE DIAGNOSIS:  Hypertrophic right groin lymphadenopathy.  OPERATION:  Right groin exploration with deep right groin lymphadenectomy.  SURGEON:  Barbaraann Barthel, M.D.  NOTE:  This is a 40 year old white male who had a history of right groin discomfort for approximately three weeks.  He sought help with Dr. Regino Schultze who thought that he either had a femoral hernia or lymphadenopathy.  He was referred to me in consultation.  My feeling was that he probably had a right femoral hernia.  Surgery was planned for the next day after discussing the details of the surgery with the patient and obtaining consent.  GROSS OPERATIVE FINDINGS:  Two large groin nodes, approximately the size of a walnut, each one, below Pouparts ligament.  There was no femoral hernia component.  Frozen section performed revealed reactive hyperplasia.  DESCRIPTION OF PROCEDURE:  The patient was placed in the supine position. After adequate administration of general anesthesia with LMA anesthesia, the patients entire abdomen was shaved, prepped with Betadine solution, and draped in the usual manner.  Prior to this, a Foley catheter was aseptically inserted.  A transverse incision was carried out over the palpable mass in the right groin which I felt was below the inguinal ligament.  Skin, subcutaneous tissue, and Scarpas layer were excised down to the palpated mass.  This became obviously two rather large lymph nodes, each one approximately the size of a walnut.  The efferent and afferent vessels of the lymph nodes  were ligated with 2-0 silk and divided.  One was sent for frozen section which revealed the above diagnosis.  For the other lymph node, the vascular pedicles were likewise ligated and divided, and this lymph node was sent for permanent section.  We checked for hemostasis and irrigated with normal saline solution. Then I used 3-0 Polysorb to approximate Scarpas layer and then used a 4-0 Polysorb subcuticular suture for a closure.  Steri-Strips were further used to approximate the skin.  Prior to closure, all sponge, needle, and instrument counts were found to be correct.  Estimated blood loss was minimal.  The patient received 850 cc crystalloid intraoperatively.  There were no complications. Dictated by:   Barbaraann Barthel, M.D. Attending Physician:  Barbaraann Barthel DD:  12/01/01 TD:  12/01/01 Job: [id number]
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
## 2011-03-18 ENCOUNTER — Inpatient Hospital Stay (HOSPITAL_COMMUNITY): Admission: RE | Admit: 2011-03-18 | Payer: Medicare Other | Source: Ambulatory Visit | Admitting: Neurosurgery

## 2011-03-24 ENCOUNTER — Emergency Department (HOSPITAL_COMMUNITY)
Admission: EM | Admit: 2011-03-24 | Discharge: 2011-03-24 | Disposition: A | Discharge status: Home / Self Care | Payer: Medicare Other | Attending: Emergency Medicine | Admitting: Emergency Medicine

## 2011-03-24 DIAGNOSIS — IMO0001 Reserved for inherently not codable concepts without codable children: Secondary | ICD-10-CM | POA: Insufficient documentation

## 2011-03-24 DIAGNOSIS — R509 Fever, unspecified: Secondary | ICD-10-CM | POA: Insufficient documentation

## 2011-04-04 ENCOUNTER — Ambulatory Visit (HOSPITAL_COMMUNITY)
Admission: RE | Admit: 2011-04-04 | Discharge: 2011-04-04 | Disposition: A | Discharge status: Home / Self Care | Payer: Medicare Other | Source: Ambulatory Visit | Attending: Neurosurgery | Admitting: Neurosurgery

## 2011-04-04 DIAGNOSIS — M5020 Other cervical disc displacement, unspecified cervical region: Principal | ICD-10-CM | POA: Insufficient documentation

## 2011-04-04 DIAGNOSIS — Z538 Procedure and treatment not carried out for other reasons: Secondary | ICD-10-CM | POA: Insufficient documentation

## 2011-05-07 ENCOUNTER — Encounter: Payer: Self-pay | Admitting: *Deleted

## 2011-05-07 ENCOUNTER — Emergency Department (HOSPITAL_COMMUNITY)
Admission: EM | Admit: 2011-05-07 | Discharge: 2011-05-08 | Disposition: A | Discharge status: Home / Self Care | Payer: Medicare Other | Attending: Emergency Medicine | Admitting: Emergency Medicine

## 2011-05-07 DIAGNOSIS — R1013 Epigastric pain: Secondary | ICD-10-CM | POA: Insufficient documentation

## 2011-05-07 DIAGNOSIS — F17200 Nicotine dependence, unspecified, uncomplicated: Secondary | ICD-10-CM | POA: Insufficient documentation

## 2011-05-07 LAB — CBC
HCT: 37 % — ABNORMAL LOW (ref 39.0–52.0)
Hemoglobin: 12.5 g/dL — ABNORMAL LOW (ref 13.0–17.0)
MCH: 27.3 pg (ref 26.0–34.0)
MCHC: 33.8 g/dL (ref 30.0–36.0)
MCV: 80.8 fL (ref 78.0–100.0)

## 2011-05-07 LAB — DIFFERENTIAL
Basophils Relative: 1 % (ref 0–1)
Eosinophils Absolute: 0.2 10*3/uL (ref 0.0–0.7)
Eosinophils Relative: 3 % (ref 0–5)
Monocytes Absolute: 0.6 10*3/uL (ref 0.1–1.0)
Monocytes Relative: 10 % (ref 3–12)
Neutro Abs: 3.1 10*3/uL (ref 1.7–7.7)

## 2011-05-07 LAB — COMPREHENSIVE METABOLIC PANEL
Albumin: 3.8 g/dL (ref 3.5–5.2)
BUN: 27 mg/dL — ABNORMAL HIGH (ref 6–23)
Calcium: 9.3 mg/dL (ref 8.4–10.5)
Chloride: 103 mEq/L (ref 96–112)
Creatinine, Ser: 1.63 mg/dL — ABNORMAL HIGH (ref 0.50–1.35)
Total Bilirubin: 0.2 mg/dL — ABNORMAL LOW (ref 0.3–1.2)

## 2011-05-07 LAB — LIPASE, BLOOD: Lipase: 34 U/L (ref 11–59)

## 2011-05-07 MED ORDER — SODIUM CHLORIDE 0.9 % IV BOLUS (SEPSIS): 1000.0000 mL | Freq: Once | INTRAVENOUS | Status: DC

## 2011-05-07 MED ORDER — OXYCODONE-ACETAMINOPHEN 5-325 MG PO TABS: 1.0000 | ORAL_TABLET | ORAL | Status: AC | PRN

## 2011-05-07 MED ORDER — SODIUM CHLORIDE 0.9 % IV BOLUS (SEPSIS)
1000.0000 mL | Freq: Once | INTRAVENOUS | Status: AC
  Administered 2011-05-07: 1000 mL via INTRAVENOUS

## 2011-05-07 MED ORDER — ONDANSETRON HCL 4 MG/2ML IJ SOLN
4.0000 mg | Freq: Once | INTRAMUSCULAR | Status: AC
  Administered 2011-05-07: 4 mg via INTRAMUSCULAR
  Filled 2011-05-07: qty 2

## 2011-05-07 MED ORDER — HYDROMORPHONE HCL 1 MG/ML IJ SOLN
0.5000 mg | Freq: Once | INTRAMUSCULAR | Status: AC
  Administered 2011-05-07: 0.5 mg via INTRAVENOUS
  Filled 2011-05-07: qty 1

## 2011-05-07 NOTE — ED Notes (Signed)
C/o epigastric pain (pressure) onset 4 days ago with bloating, progressively worsening; denies n/v/d; denies constipation

## 2011-05-07 NOTE — ED Provider Notes (Signed)
History     Chief Complaint  Patient presents with  . Abdominal Pain    epigastric pain x 4 days   Patient is a 40 y.o. male presenting with abdominal pain. The history is provided by the patient. No language interpreter was used.  Abdominal Pain The primary symptoms of the illness include abdominal pain. The primary symptoms of the illness do not include fever, fatigue, shortness of breath, nausea, vomiting, diarrhea, hematemesis or hematochezia. Episode onset: 4 days ago. The onset of the illness was sudden. The problem has been gradually worsening.  Associated with: nothing. Symptoms associated with the illness do not include chills, constipation or hematuria.  C/o abdominal pain onset 4 days ago and becoming gradually more intense since onset. Patient reports history of abdominal pain with eating greasy/fatty food previously but states pain has always subsided with previous episodes. States pain intermittently radiates down LLE. Denies chest pain, fever, n/v/d, cough, chest pain, blood in stool, dysuria. Current smoker.   Patient seen at 9:44Pm History reviewed. No pertinent past medical history.  Past Surgical History  Procedure Date  . Shoulder arthroscopy     No family history on file.  History  Substance Use Topics  . Smoking status: Current Everyday Smoker -- 1.0 packs/day    Types: Cigarettes  . Smokeless tobacco: Not on file  . Alcohol Use: No      Review of Systems  Constitutional: Negative for fever, chills and fatigue.  Respiratory: Negative for shortness of breath.   Gastrointestinal: Positive for abdominal pain. Negative for nausea, vomiting, diarrhea, constipation, hematochezia and hematemesis.  Genitourinary: Negative for hematuria.  All other systems reviewed and are negative.    Physical Exam  BP 145/90  Pulse 77  Temp(Src) 98.5 F (36.9 C) (Oral)  Ht 5\' 9"  (1.753 m)  Wt 139 lb (63.05 kg)  BMI 20.53 kg/m2  SpO2 98%  Physical  Exam CONSTITUTIONAL: Well developed/well nourished HEAD AND FACE: Normocephalic/atraumatic EYES: EOMI/PERRL, no scleral icterus, conjunctiva pink ENMT: Mucous membranes moist NECK: supple no meningeal signs SPINE:entire spine nontender CV: S1/S2 noted, no murmurs/rubs/gallops noted LUNGS: Lungs are clear to auscultation bilaterally, no apparent distress ABDOMEN: soft, no rebound or guarding, bowel sound normal, epigastric tenderness. GU:no hernia present, no testicular tenderness NEURO: Pt is awake/alert, moves all extremitiesx4 EXTREMITIES: pulses normal, full ROM SKIN: warm, color normal PSYCH: no abnormalities of mood noted  ED Course  Procedures  MDM Previous records reviewed and considered Nursing notes reviewed and considered in documentation All labs/vitals reviewed and considered  I observed pt in ED for several hours.  Taking PO, abd soft, watching TV.  I do not suspect acute cholecystitis or acute appy other acute abd process.  Pt is well appearing. His creat is elevated, but given IV fluids, and advised recheck of this in the next several weeks.    I had long discussion with patient/family about if he has continued pain in the next 8-12 hours he needs to return for re-evaluation.  At this time he does not need emergent imaging They are agreeable with plan  I personally performed the services described in this documentation, which was scribed in my presence. The recorded information has been reviewed and considered.      Joya Gaskins, MD 05/07/11 [phone]

## 2011-05-08 NOTE — ED Notes (Signed)
Pt eating a  Sandwich from [employer] at time of d/c

## 2011-05-08 NOTE — ED Notes (Signed)
Informed patient that he would be discharged shortly.  Instructed to get dressed. Family at bedside. Voided 250cc of yellow urine.

## 2011-05-13 ENCOUNTER — Inpatient Hospital Stay (HOSPITAL_COMMUNITY)
Admission: RE | Admit: 2011-05-13 | Discharge: 2011-05-14 | DRG: 472 | Disposition: A | Discharge status: Home / Self Care | Payer: Medicare Other | Source: Ambulatory Visit | Attending: Neurosurgery | Admitting: Neurosurgery

## 2011-05-13 ENCOUNTER — Ambulatory Visit (HOSPITAL_COMMUNITY): Payer: Medicare Other

## 2011-05-13 DIAGNOSIS — M4802 Spinal stenosis, cervical region: Secondary | ICD-10-CM | POA: Diagnosis present

## 2011-05-13 DIAGNOSIS — M5000 Cervical disc disorder with myelopathy, unspecified cervical region: Principal | ICD-10-CM | POA: Diagnosis present

## 2011-05-13 DIAGNOSIS — M4712 Other spondylosis with myelopathy, cervical region: Secondary | ICD-10-CM | POA: Diagnosis present

## 2011-05-13 DIAGNOSIS — R292 Abnormal reflex: Secondary | ICD-10-CM | POA: Diagnosis present

## 2011-05-13 LAB — BASIC METABOLIC PANEL
BUN: 18 mg/dL (ref 6–23)
CO2: 30 mEq/L (ref 19–32)
Calcium: 9 mg/dL (ref 8.4–10.5)
Chloride: 103 mEq/L (ref 96–112)
Creatinine, Ser: 1.35 mg/dL (ref 0.50–1.35)
GFR calc Af Amer: >60 mL/min (ref >60)
GFR calc non Af Amer: 59 mL/min — ABNORMAL LOW (ref >60)
Glucose, Bld: 95 mg/dL (ref 70–99)
Potassium: 3.9 mEq/L (ref 3.5–5.1)
Sodium: 142 mEq/L (ref 135–145)

## 2011-05-13 LAB — SURGICAL PCR SCREEN
MRSA, PCR: NEGATIVE
Staphylococcus aureus: NEGATIVE

## 2011-05-20 NOTE — Op Note (Signed)
Francis [name], Francis [name] NO.:  [id number]  MEDICAL RECORD NO.:  [id number]  LOCATION:  3526                         FACILITY:  [facility]  PHYSICIAN:  Hewitt Shorts, M.D.DATE OF BIRTH:  Jan 10, 1971  DATE OF PROCEDURE:  05/13/2011 DATE OF DISCHARGE:                              OPERATIVE REPORT   PREOPERATIVE DIAGNOSES: 1. C5-6 cervical disk herniation with myelopathy. 2. Cervical spondylosis with myelopathy. 3. Cervical stenosis and hyperreflexia.  POSTOPERATIVE DIAGNOSES: 1. C5-6 cervical disk herniation with myelopathy. 2. Cervical spondylosis with myelopathy. 3. Cervical stenosis and hyperreflexia.  PROCEDURE:  C5-6 anterior cervical decompression arthrodesis with allograft and Tether cervical plating.  SURGEON:  Hewitt Shorts, MD  ASSISTANT:  Clydene Fake, MD  ANESTHESIA:  General endotracheal.  INDICATIONS:  The patient is a 40 year old man who presented with myelopathy who was found to have a large C5-6 cervical disk herniation and he was brought to surgery for diskectomy for decompression.  DESCRIPTION OF PROCEDURE:  The patient was brought to the operating room and placed under general endotracheal anesthesia.  The patient was placed in 10 pounds of Halter traction.  The neck was prepped with Betadine soap and solution and draped in sterile fashion.  The horizontal incision was made on the left side of the neck.  The line of the incision was infiltrated with local anesthetic with epinephrine. Incision was made, carried down through the subcutaneous tissue and platysma.  Bipolar cautery and electrocautery were used to maintain hemostasis.  Dissection was carried down through an avascular plane leaving the sternocleidomastoid, carotid artery, and jugular vein laterally and the trachea and esophagus medially.  The ventral aspect of the vertebral column was identified and localizing x-rays were taken and a C5-6 intervertebral disk space was  identified.  The operating microscope was then draped and was brought into the field to provide additional navigation, illumination, and visualization, and the decompression was performed using microdissection and microsurgical technique.  The annulus was incised anteriorly and the disk space entered.  A thorough diskectomy was performed using variety of micro curettes and pituitary rongeurs.  The cartilaginous endplates of the vertebral bodies removed using micro-curettes along with high-speed drill and then posterior osteophytic overgrowth was removed with the high-speed drill and a 2-mm Kerrison punch with a thin footplate.  The posterior longitudinal ligament was carefully opened and we removed the posterior longitudinal ligament and disk herniation and removed the spondylitic overgrowth posteriorly from the posterior aspect of C5 and C6.  We were able to decompress the spinal canal and thecal sac and the neural foramina and exiting nerve roots.  Once decompression was completed, hemostasis WAS established with the use of Gelfoam with thrombin.  We measured the height of the intervertebral disk space and selected a 7-mm allograft implant.  This was carefully positioned between the vertebra and countersunk.  We then selected a 12-mm tether cervical plate.  It was positioned over the fusion construct and secured to the vertebra with 4 x 13 mm screws using a pair of fixed screws at C5 and a pair of variable screws at C6.  The wound was irrigated with bacitracin solution, checked for hemostasis, which was established and confirmed and  x-ray showed good anatomic construct with the plate, screws, and graft were all in good position.  The alignment was good. Once hemostasis was again confirmed, we proceeded with closure. Platysma was closed with interrupted inverted 2-0 undyed Vicryl sutures. The subcutaneous and subcuticular were closed with interrupted inverted 3-0 undyed Vicryl sutures.   Skin was approximated with Dermabond.  The procedure was tolerated well.  The estimated blood loss was 25 mL. Sponge and needle count were correct.  Following surgery, the patient was taken out of cervical traction, placed in a soft cervical collar, to be reversed from the anesthetic, extubated, and transferred to the recovery room for further care.     Hewitt Shorts, M.D.     RWN/MEDQ  D:  05/13/2011  T:  05/13/2011  Job:  [id number]  Electronically Signed by Shirlean Kelly M.D. on 05/20/2011 09:46:03 AM

## 2011-07-30 LAB — CBC
HCT: 44.8
Hemoglobin: 14.8
MCHC: 33
MCV: 88.4
RBC: 5.07

## 2012-02-16 ENCOUNTER — Encounter (HOSPITAL_COMMUNITY): Payer: Self-pay

## 2012-02-16 ENCOUNTER — Emergency Department (HOSPITAL_COMMUNITY)
Admission: EM | Admit: 2012-02-16 | Discharge: 2012-02-17 | Disposition: A | Discharge status: Other Acute Inpatient Hospital | Payer: Medicare Other | Source: Home / Self Care | Attending: *Deleted | Admitting: *Deleted

## 2012-02-16 DIAGNOSIS — G40909 Epilepsy, unspecified, not intractable, without status epilepticus: Secondary | ICD-10-CM | POA: Diagnosis not present

## 2012-02-16 DIAGNOSIS — F329 Major depressive disorder, single episode, unspecified: Secondary | ICD-10-CM | POA: Insufficient documentation

## 2012-02-16 DIAGNOSIS — F609 Personality disorder, unspecified: Secondary | ICD-10-CM | POA: Insufficient documentation

## 2012-02-16 DIAGNOSIS — R5381 Other malaise: Secondary | ICD-10-CM | POA: Insufficient documentation

## 2012-02-16 DIAGNOSIS — F17200 Nicotine dependence, unspecified, uncomplicated: Secondary | ICD-10-CM | POA: Insufficient documentation

## 2012-02-16 DIAGNOSIS — G4700 Insomnia, unspecified: Secondary | ICD-10-CM | POA: Insufficient documentation

## 2012-02-16 DIAGNOSIS — F1994 Other psychoactive substance use, unspecified with psychoactive substance-induced mood disorder: Secondary | ICD-10-CM | POA: Insufficient documentation

## 2012-02-16 DIAGNOSIS — F1193 Opioid use, unspecified with withdrawal: Secondary | ICD-10-CM | POA: Diagnosis present

## 2012-02-16 DIAGNOSIS — F1210 Cannabis abuse, uncomplicated: Secondary | ICD-10-CM | POA: Insufficient documentation

## 2012-02-16 DIAGNOSIS — Z9889 Other specified postprocedural states: Secondary | ICD-10-CM

## 2012-02-16 DIAGNOSIS — F1120 Opioid dependence, uncomplicated: Secondary | ICD-10-CM | POA: Insufficient documentation

## 2012-02-16 DIAGNOSIS — F3289 Other specified depressive episodes: Secondary | ICD-10-CM | POA: Insufficient documentation

## 2012-02-16 DIAGNOSIS — F1010 Alcohol abuse, uncomplicated: Secondary | ICD-10-CM | POA: Insufficient documentation

## 2012-02-16 DIAGNOSIS — F1123 Opioid dependence with withdrawal: Secondary | ICD-10-CM | POA: Diagnosis present

## 2012-02-16 DIAGNOSIS — F1910 Other psychoactive substance abuse, uncomplicated: Secondary | ICD-10-CM

## 2012-02-16 DIAGNOSIS — F1410 Cocaine abuse, uncomplicated: Secondary | ICD-10-CM | POA: Insufficient documentation

## 2012-02-16 HISTORY — DX: Anxiety disorder, unspecified: F41.9

## 2012-02-16 HISTORY — DX: Depression, unspecified: F32.A

## 2012-02-16 HISTORY — DX: Other psychoactive substance abuse, uncomplicated: F19.10

## 2012-02-16 HISTORY — DX: Unspecified convulsions: R56.9

## 2012-02-16 HISTORY — DX: Major depressive disorder, single episode, unspecified: F32.9

## 2012-02-16 LAB — CBC
HCT: 45.3 % (ref 39.0–52.0)
MCH: 26.9 pg (ref 26.0–34.0)
MCHC: 33.6 g/dL (ref 30.0–36.0)
MCV: 80.2 fL (ref 78.0–100.0)
Platelets: 225 10*3/uL (ref 150–400)
RDW: 13.9 % (ref 11.5–15.5)
WBC: 12.7 10*3/uL — ABNORMAL HIGH (ref 4.0–10.5)

## 2012-02-16 LAB — BASIC METABOLIC PANEL
Calcium: 10.3 mg/dL (ref 8.4–10.5)
Creatinine, Ser: 0.84 mg/dL (ref 0.50–1.35)
GFR calc non Af Amer: >90 mL/min (ref >90)
Sodium: 137 mEq/L (ref 135–145)

## 2012-02-16 LAB — ETHANOL: Alcohol, Ethyl (B): <11 mg/dL (ref 0–11)

## 2012-02-16 LAB — RAPID URINE DRUG SCREEN, HOSP PERFORMED
Amphetamines: NOT DETECTED
Opiates: NOT DETECTED

## 2012-02-16 LAB — DIFFERENTIAL
Basophils Absolute: 0 10*3/uL (ref 0.0–0.1)
Basophils Relative: 0 % (ref 0–1)
Eosinophils Absolute: 0 10*3/uL (ref 0.0–0.7)
Eosinophils Relative: 0 % (ref 0–5)
Monocytes Absolute: 0.6 10*3/uL (ref 0.1–1.0)

## 2012-02-16 MED ORDER — NICOTINE 21 MG/24HR TD PT24
MEDICATED_PATCH | TRANSDERMAL | Status: AC
  Administered 2012-02-16: 21 mg
  Filled 2012-02-16: qty 1

## 2012-02-16 MED ORDER — NON FORMULARY: 1.0000 mg | Freq: Every evening | Status: DC | PRN

## 2012-02-16 MED ORDER — ESZOPICLONE 1 MG PO TABS
ORAL_TABLET | ORAL | Status: AC
  Filled 2012-02-16: qty 1

## 2012-02-16 MED ORDER — ALUM & MAG HYDROXIDE-SIMETH 200-200-20 MG/5ML PO SUSP: 30.0000 mL | ORAL | Status: DC | PRN

## 2012-02-16 MED ORDER — ESZOPICLONE 1 MG PO TABS
1.0000 mg | ORAL_TABLET | Freq: Every evening | ORAL | Status: DC | PRN
  Administered 2012-02-16: 1 mg via ORAL
  Filled 2012-02-16: qty 1

## 2012-02-16 MED ORDER — LORAZEPAM 2 MG/ML IJ SOLN
2.0000 mg | Freq: Once | INTRAMUSCULAR | Status: AC
  Administered 2012-02-16: 2 mg via INTRAMUSCULAR
  Filled 2012-02-16: qty 1

## 2012-02-16 MED ORDER — ONDANSETRON 8 MG PO TBDP
8.0000 mg | ORAL_TABLET | Freq: Once | ORAL | Status: AC
  Administered 2012-02-16: 8 mg via ORAL
  Filled 2012-02-16: qty 1

## 2012-02-16 MED ORDER — NICOTINE 21 MG/24HR TD PT24
21.0000 mg | MEDICATED_PATCH | Freq: Every day | TRANSDERMAL | Status: DC
  Filled 2012-02-16: qty 1

## 2012-02-16 MED ORDER — ONDANSETRON HCL 4 MG PO TABS: 4.0000 mg | ORAL_TABLET | Freq: Three times a day (TID) | ORAL | Status: DC | PRN

## 2012-02-16 MED ORDER — ZOLPIDEM TARTRATE 5 MG PO TABS: 5.0000 mg | ORAL_TABLET | Freq: Every evening | ORAL | Status: DC | PRN

## 2012-02-16 MED ORDER — IBUPROFEN 400 MG PO TABS
600.0000 mg | ORAL_TABLET | Freq: Three times a day (TID) | ORAL | Status: DC | PRN
  Administered 2012-02-17 (×2): 600 mg via ORAL
  Filled 2012-02-16 (×2): qty 2

## 2012-02-16 MED ORDER — LORAZEPAM 1 MG PO TABS
1.0000 mg | ORAL_TABLET | Freq: Three times a day (TID) | ORAL | Status: DC | PRN
  Administered 2012-02-17 (×2): 1 mg via ORAL
  Filled 2012-02-16 (×3): qty 1

## 2012-02-16 MED ORDER — ACETAMINOPHEN 325 MG PO TABS
650.0000 mg | ORAL_TABLET | ORAL | Status: DC | PRN
  Administered 2012-02-17 (×2): 650 mg via ORAL
  Filled 2012-02-16 (×2): qty 2

## 2012-02-16 NOTE — ED Notes (Signed)
Patients belongings removed and placed in personal belongings bag then placed in secure locker

## 2012-02-16 NOTE — ED Notes (Signed)
Withdrawing from synthetic heroine, also stated "in a strange way im am si/hi, when asked if plan stated "theres always a plan", just smiled when asked hi plan.
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
## 2012-02-16 NOTE — ED Notes (Signed)
Greg, ACT in with pt.

## 2012-02-16 NOTE — ED Notes (Signed)
Family requesting for pt to be sent to [facility]. Family gave Reuel Boom, RN a number [phone] Coy Saunas) I am assuming for [facility].

## 2012-02-17 ENCOUNTER — Inpatient Hospital Stay (HOSPITAL_COMMUNITY)
Admission: AD | Admit: 2012-02-17 | Discharge: 2012-02-27 | DRG: 897 | Disposition: A | Discharge status: Home / Self Care | Payer: Medicare Other | Source: Ambulatory Visit | Attending: Psychiatry | Admitting: Psychiatry

## 2012-02-17 ENCOUNTER — Encounter (HOSPITAL_COMMUNITY): Payer: Self-pay

## 2012-02-17 DIAGNOSIS — F1120 Opioid dependence, uncomplicated: Secondary | ICD-10-CM | POA: Diagnosis present

## 2012-02-17 DIAGNOSIS — Z91199 Patient's noncompliance with other medical treatment and regimen due to unspecified reason: Secondary | ICD-10-CM

## 2012-02-17 DIAGNOSIS — G40909 Epilepsy, unspecified, not intractable, without status epilepticus: Secondary | ICD-10-CM | POA: Diagnosis not present

## 2012-02-17 DIAGNOSIS — G40209 Localization-related (focal) (partial) symptomatic epilepsy and epileptic syndromes with complex partial seizures, not intractable, without status epilepticus: Secondary | ICD-10-CM | POA: Diagnosis present

## 2012-02-17 DIAGNOSIS — F39 Unspecified mood [affective] disorder: Secondary | ICD-10-CM

## 2012-02-17 DIAGNOSIS — F1193 Opioid use, unspecified with withdrawal: Secondary | ICD-10-CM

## 2012-02-17 DIAGNOSIS — F1994 Other psychoactive substance use, unspecified with psychoactive substance-induced mood disorder: Secondary | ICD-10-CM | POA: Diagnosis present

## 2012-02-17 DIAGNOSIS — Z9119 Patient's noncompliance with other medical treatment and regimen: Secondary | ICD-10-CM

## 2012-02-17 DIAGNOSIS — Z9889 Other specified postprocedural states: Secondary | ICD-10-CM

## 2012-02-17 DIAGNOSIS — F19939 Other psychoactive substance use, unspecified with withdrawal, unspecified: Principal | ICD-10-CM | POA: Diagnosis not present

## 2012-02-17 DIAGNOSIS — F609 Personality disorder, unspecified: Secondary | ICD-10-CM | POA: Diagnosis present

## 2012-02-17 DIAGNOSIS — F411 Generalized anxiety disorder: Secondary | ICD-10-CM | POA: Diagnosis present

## 2012-02-17 DIAGNOSIS — F1310 Sedative, hypnotic or anxiolytic abuse, uncomplicated: Secondary | ICD-10-CM | POA: Diagnosis present

## 2012-02-17 DIAGNOSIS — M25519 Pain in unspecified shoulder: Secondary | ICD-10-CM

## 2012-02-17 DIAGNOSIS — F1123 Opioid dependence with withdrawal: Secondary | ICD-10-CM

## 2012-02-17 DIAGNOSIS — F1920 Other psychoactive substance dependence, uncomplicated: Secondary | ICD-10-CM | POA: Diagnosis present

## 2012-02-17 MED ORDER — NAPROXEN 500 MG PO TABS: 500.0000 mg | ORAL_TABLET | Freq: Two times a day (BID) | ORAL | Status: DC | PRN

## 2012-02-17 MED ORDER — LEVETIRACETAM ER 500 MG PO TB24: 500.0000 mg | ORAL_TABLET | Freq: Every day | ORAL | Status: DC

## 2012-02-17 MED ORDER — ONDANSETRON 4 MG PO TBDP: 4.0000 mg | ORAL_TABLET | Freq: Four times a day (QID) | ORAL | Status: DC | PRN

## 2012-02-17 MED ORDER — METHOCARBAMOL 500 MG PO TABS: 500.0000 mg | ORAL_TABLET | Freq: Three times a day (TID) | ORAL | Status: DC | PRN

## 2012-02-17 MED ORDER — CLONIDINE HCL 0.1 MG PO TABS
0.1000 mg | ORAL_TABLET | Freq: Four times a day (QID) | ORAL | Status: AC
  Administered 2012-02-17 – 2012-02-20 (×10): 0.1 mg via ORAL
  Filled 2012-02-17 (×10): qty 1

## 2012-02-17 MED ORDER — MAGNESIUM HYDROXIDE 400 MG/5ML PO SUSP: 30.0000 mL | Freq: Every day | ORAL | Status: DC | PRN

## 2012-02-17 MED ORDER — CLONIDINE HCL 0.1 MG PO TABS: 0.1000 mg | ORAL_TABLET | ORAL | Status: DC

## 2012-02-17 MED ORDER — CLONIDINE HCL 0.1 MG PO TABS: 0.1000 mg | ORAL_TABLET | Freq: Four times a day (QID) | ORAL | Status: DC

## 2012-02-17 MED ORDER — CLONIDINE HCL 0.1 MG PO TABS: 0.1000 mg | ORAL_TABLET | Freq: Every day | ORAL | Status: DC

## 2012-02-17 MED ORDER — HYDROXYZINE HCL 25 MG PO TABS: 25.0000 mg | ORAL_TABLET | Freq: Four times a day (QID) | ORAL | Status: DC | PRN

## 2012-02-17 MED ORDER — TRAZODONE HCL 100 MG PO TABS
100.0000 mg | ORAL_TABLET | Freq: Once | ORAL | Status: AC
  Administered 2012-02-17: 100 mg via ORAL
  Filled 2012-02-17: qty 1

## 2012-02-17 MED ORDER — LOPERAMIDE HCL 2 MG PO CAPS: 2.0000 mg | ORAL_CAPSULE | ORAL | Status: DC | PRN

## 2012-02-17 MED ORDER — DICYCLOMINE HCL 20 MG PO TABS: 20.0000 mg | ORAL_TABLET | ORAL | Status: DC | PRN

## 2012-02-17 MED ORDER — CLONIDINE HCL 0.1 MG PO TABS
0.1000 mg | ORAL_TABLET | ORAL | Status: AC
  Administered 2012-02-20 – 2012-02-22 (×4): 0.1 mg via ORAL
  Filled 2012-02-17 (×4): qty 1

## 2012-02-17 MED ORDER — NAPROXEN 250 MG PO TABS: 500.0000 mg | ORAL_TABLET | Freq: Two times a day (BID) | ORAL | Status: DC | PRN

## 2012-02-17 MED ORDER — ALUM & MAG HYDROXIDE-SIMETH 200-200-20 MG/5ML PO SUSP: 30.0000 mL | ORAL | Status: DC | PRN

## 2012-02-17 MED ORDER — ACETAMINOPHEN 325 MG PO TABS: 650.0000 mg | ORAL_TABLET | Freq: Four times a day (QID) | ORAL | Status: DC | PRN

## 2012-02-17 MED ORDER — LORAZEPAM 1 MG PO TABS
1.0000 mg | ORAL_TABLET | Freq: Once | ORAL | Status: AC
  Administered 2012-02-17: 1 mg via ORAL

## 2012-02-17 MED ORDER — CLONIDINE HCL 0.1 MG PO TABS
0.1000 mg | ORAL_TABLET | Freq: Every day | ORAL | Status: AC
  Administered 2012-02-23 – 2012-02-24 (×2): 0.1 mg via ORAL
  Filled 2012-02-17 (×2): qty 1

## 2012-02-17 MED ORDER — ACETAMINOPHEN 325 MG PO TABS
650.0000 mg | ORAL_TABLET | Freq: Four times a day (QID) | ORAL | Status: DC | PRN
  Administered 2012-02-18 – 2012-02-26 (×12): 650 mg via ORAL

## 2012-02-17 MED ORDER — DICYCLOMINE HCL 20 MG PO TABS
20.0000 mg | ORAL_TABLET | ORAL | Status: AC | PRN
  Administered 2012-02-18 (×2): 20 mg via ORAL
  Filled 2012-02-17 (×2): qty 1

## 2012-02-17 MED ORDER — LOPERAMIDE HCL 2 MG PO CAPS
2.0000 mg | ORAL_CAPSULE | ORAL | Status: AC | PRN
  Administered 2012-02-18: 2 mg via ORAL
  Administered 2012-02-18: 4 mg via ORAL

## 2012-02-17 MED ORDER — ONDANSETRON 4 MG PO TBDP
4.0000 mg | ORAL_TABLET | Freq: Four times a day (QID) | ORAL | Status: AC | PRN
  Administered 2012-02-18: 4 mg via ORAL
  Filled 2012-02-17: qty 1

## 2012-02-17 MED ORDER — TRAZODONE HCL 50 MG PO TABS
ORAL_TABLET | ORAL | Status: AC
  Filled 2012-02-17: qty 2

## 2012-02-17 MED ORDER — HYDROXYZINE HCL 25 MG PO TABS
25.0000 mg | ORAL_TABLET | Freq: Four times a day (QID) | ORAL | Status: DC | PRN
  Administered 2012-02-17: 25 mg via ORAL

## 2012-02-17 NOTE — Tx Team (Addendum)
Initial Interdisciplinary Treatment Plan  PATIENT STRENGTHS: (choose at least two) Ability for insight Communication skills General fund of knowledge  PATIENT STRESSORS: Substance abuse   PROBLEM LIST: Problem List/Patient Goals Date to be addressed Date deferred Reason deferred Estimated date of resolution  Substance Abuse      Depression                                                 DISCHARGE CRITERIA:  Motivation to continue treatment in a less acute level of care  PRELIMINARY DISCHARGE PLAN: Outpatient therapy  PATIENT/FAMIILY INVOLVEMENT: This treatment plan has been presented to and reviewed with the patient, Francis [name], and/or family member.  The patient and family have been given the opportunity to ask questions and make suggestions.  Francis [name] [facility] 02/17/2012, 6:39 PM
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
## 2012-02-17 NOTE — ED Notes (Signed)
Pt still awake wanting to know "if we are giving him, the bare minimum" of medication. Pt requesting meds to "knock him out."

## 2012-02-17 NOTE — Progress Notes (Addendum)
Patient ID: Francis [name], male   DOB: 22-Aug-1971, 41 y.o.   MRN: [id number] Pt denies SI/HI/AVH. Pt states that he was a pt here in March for OD in a SA. Pt started having seizures at age 41 with last one 10 days ago. Pt states that in 2011 he broke his neck because of a fall during a seizure. Pt states that he had neck surgery that has affected his gait. Pt is a moderate fall risk. Pt states that the longest that he has gone without using drugs is 1 week and he does not remember how long ago that was. Pt brought himself into the ED last night for detox. Pt has been injecting Roxicodone daily, for that past 3.5 years. Pt currently experiencing withdrawal. CIWA 12 on admit. Pt tangential. Pt has 71 year old son. Pt is homeless along with his wife. Pt escorted to unit.

## 2012-02-18 MED ORDER — NICOTINE 21 MG/24HR TD PT24
21.0000 mg | MEDICATED_PATCH | Freq: Every day | TRANSDERMAL | Status: DC
  Administered 2012-02-18 – 2012-02-27 (×10): 21 mg via TRANSDERMAL
  Filled 2012-02-18 (×15): qty 1

## 2012-02-18 MED ORDER — PHENYTOIN SODIUM EXTENDED 100 MG PO CAPS
400.0000 mg | ORAL_CAPSULE | Freq: Once | ORAL | Status: AC
  Administered 2012-02-18: 400 mg via ORAL
  Filled 2012-02-18: qty 4
  Filled 2012-02-18 (×2): qty 1

## 2012-02-18 MED ORDER — PHENYTOIN SODIUM EXTENDED 100 MG PO CAPS
300.0000 mg | ORAL_CAPSULE | Freq: Every day | ORAL | Status: DC
  Administered 2012-02-19 – 2012-02-26 (×8): 300 mg via ORAL
  Filled 2012-02-18 (×2): qty 3
  Filled 2012-02-18: qty 2
  Filled 2012-02-18: qty 42
  Filled 2012-02-18: qty 3
  Filled 2012-02-18: qty 42
  Filled 2012-02-18 (×6): qty 3

## 2012-02-18 MED ORDER — HYDROXYZINE HCL 25 MG PO TABS
25.0000 mg | ORAL_TABLET | Freq: Four times a day (QID) | ORAL | Status: DC | PRN
  Administered 2012-02-18 – 2012-02-23 (×4): 25 mg via ORAL
  Filled 2012-02-18 (×2): qty 1

## 2012-02-18 MED ORDER — NICOTINE POLACRILEX 2 MG MT GUM
2.0000 mg | CHEWING_GUM | OROMUCOSAL | Status: DC | PRN
  Administered 2012-02-18: 2 mg via ORAL
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
## 2012-02-18 NOTE — Progress Notes (Signed)
[facility] Group Notes:  (Counselor/Nursing/MHT/Case Management/Adjunct)  02/18/2012 8:19 PM  Type of Therapy:  Group Therapy at 11AM  Participation Level:  Active  Participation Quality:  Intrusive, Resistant and Sharing  Affect:  Irritable  Cognitive:  Oriented  Insight:  Poor  Engagement in Group:  Good  Engagement in Therapy:  Limited  Modes of Intervention:  Clarification, Limit-setting and Problem-solving  Summary of Progress/Problems:  Patient shared that IV drug use brought him into hospital. When asked how long he had been dealing with this issue and if he was ready to stop patient responded by stating "You don't really care, everything you say is scripted."  Patient then shared his difficulty trusting others which other group members could relate to.  Londell asked several times if anyone knew counselor that worked at [facility] whom was not longer there; this counselor was apparently helpful to patient yet on recent visit was told counselor no longer there.   [name] 02/18/2012, 8:19 PM  [facility] Group Notes:  (Counselor/Nursing/MHT/Case Management/Adjunct)  02/18/2012 8:25 PM  Type of Therapy:  Group Therapy at 1:15  Participation Level:  None  Participation Quality:  Drowsy  Affect:  Flat  Cognitive:  Oriented  Insight:  None  Engagement in Group:  None  Engagement in Therapy:  None  Modes of Intervention:  Support  Summary of Progress/Problems:  Patient attended group as requested by staff but appeared to be heavily medicated and drowsy to point of falling asleep.  Timmey was more alert at the beginning of group where writer addressed trust issues and patient expressed gratitude for listening to his earlier concerns   [name] 02/18/2012, 8:25 PM
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
## 2012-02-18 NOTE — Progress Notes (Signed)
Pt was in bed upon first assessment this morning.  He c/o of multiple symptoms of withdrawal and his CIWA was 13 and COWS 12 at that time.  He was given several prn meds at [phone] (see daily cares)  He rated his depression a 7 hopelessness a 10 and anxiety a 10 on his self-inventory.  He did admit to some passive S/I without any plan and did contract for safety on the unit.  He stated,"I can let somebody know if I feel suicidal".  He then slept for a while and was up for group at 1100 and remained up and active in the milieu.  He does have an order for EEG but can't be scheduled before tomorrow morning when they open.  The hours are from 0800-1600 at [facility].  Pt did talk in great detail about his seizure disorder and how the mediations he has been prescribed to take for his seizures in the past make him "feel suicidal".  He did say he has an auro sometimes that he feels dizzy then his heart will begin to race. He did say he spoke with the doctor today about possibly trying Dilantin which he has never tried before.  He had no medication changes today.  Pt was just in hallway and slammed the receiver down on the phone.  Now, the hall phone on 300 is not working.  He stated,"Just take it out of my paycheck"  Pt showed no remorse for his actions.

## 2012-02-18 NOTE — Progress Notes (Signed)
Patient ID: Francis [name], male   DOB: 1971-01-01, 41 y.o.   MRN: [id number]  On initial contact with pt at 2000, he would not acknowledge Clinical research associate. Pt was laying on the bed and wouldn't answer. On med pass, pt was focused on which meds he was going to be given. Support and encouragement was offered.
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
## 2012-02-19 MED ORDER — GABAPENTIN 300 MG PO CAPS
300.0000 mg | ORAL_CAPSULE | Freq: Three times a day (TID) | ORAL | Status: DC
  Administered 2012-02-19 – 2012-02-27 (×26): 300 mg via ORAL
  Filled 2012-02-19 (×10): qty 1
  Filled 2012-02-19: qty 42
  Filled 2012-02-19 (×2): qty 1
  Filled 2012-02-19: qty 42
  Filled 2012-02-19 (×4): qty 1
  Filled 2012-02-19: qty 42
  Filled 2012-02-19: qty 1
  Filled 2012-02-19: qty 42
  Filled 2012-02-19 (×4): qty 1
  Filled 2012-02-19: qty 42
  Filled 2012-02-19 (×7): qty 1

## 2012-02-19 MED ORDER — METHOCARBAMOL 500 MG PO TABS
500.0000 mg | ORAL_TABLET | Freq: Three times a day (TID) | ORAL | Status: DC | PRN
  Administered 2012-02-19 – 2012-02-26 (×4): 500 mg via ORAL
  Filled 2012-02-19 (×4): qty 1

## 2012-02-19 MED ORDER — TRAZODONE HCL 100 MG PO TABS
100.0000 mg | ORAL_TABLET | Freq: Every day | ORAL | Status: DC
  Administered 2012-02-19 – 2012-02-23 (×5): 100 mg via ORAL
  Filled 2012-02-19 (×7): qty 1

## 2012-02-19 MED ORDER — TRAZODONE HCL 50 MG PO TABS
50.0000 mg | ORAL_TABLET | Freq: Every evening | ORAL | Status: DC | PRN
  Administered 2012-02-19: 50 mg via ORAL
  Filled 2012-02-19: qty 1

## 2012-02-19 NOTE — Progress Notes (Signed)
Pt mood has improved from his early incident of slamming the 300 hall phone and breaking it for any  further use at this time. Pt admitted to the writer that he didn't mean to do that and that it was uncontrollable at the time. Pt asked this Clinical research associate about lithium, which he sates he discussed with a physician here ( he doesn't recall her name) on possibly starting lithium to stabilize his mood. Pt seem very interested in this med. Pt also stated that he just wants to go home soon. Pt was started on 400 mg of Dilantin this evening. Future orders calls for 300 mg nightly. Writer informed pt to report any changes in suicidal thoughts to this Clinical research associate or any staff member. Pt reports SI from previous intake of anticonvulsants such as tegretol. Pt was receptive to the information. Pt contracts for safety at this time. Around 0140 pt complained of involuntarily muscle movement in his legs. Pt was reordered robaxin 500 mg for these symptoms.

## 2012-02-19 NOTE — Progress Notes (Signed)
[facility] Group Notes:  (Counselor/Nursing/MHT/Case Management/Adjunct)  02/19/2012 1:11 PM  Type of Therapy:  Group Therapy  Participation Level:  Minimal  Participation Quality:  Appropriate and Attentive  Affect:  Blunted and Flat  Cognitive:  Oriented  Insight:  Limited  Engagement in Group:  Limited  Engagement in Therapy:  Limited  Modes of Intervention:  Education and Exploration  Summary of Progress/Problems: In discussing emotion regulation, patient stated lack of money often makes him angry. Patient reported "embarrassment can lead to anxiety which can lead to anger". Patient states when he becomes angry he goes from "point A to point B" and unable to identify his emotions in between. Patient seemed to have difficulty relating to the topic around identifying emotions that lead to anger.   Wilmon Arms 02/19/2012, 1:11 PM

## 2012-02-19 NOTE — Discharge Planning (Signed)
Pt present in morning d/c planning group. Presents with flat affect and reports minimal sleep. States that his w/d symptoms continue to improve daily. Pt states that he would like to change the people, places, and things in his life and believes this will be beneficial to his sobriety. Pt is also open to NA meetings and seeing a therapist to deal some of his underlying issues.

## 2012-02-19 NOTE — Treatment Plan (Signed)
Interdisciplinary Treatment Plan Update (Adult)  Date: 02/19/2012  Time Reviewed: 10:13 AM   Progress in Treatment: Attending groups: Yes Participating in groups: Yes Taking medication as prescribed: Yes Tolerating medication: Yes   Family/Significant othe contact made: No Patient understands diagnosis:  Yes  As evidenced by asking for help with depression, substance abuse Discussing patient identified problems/goals with staff:  Yes  See below Medical problems stabilized or resolved:  Yes Denies suicidal/homicidal ideation: No  "On and off"  Contracts for safety Issues/concerns per patient self-inventory:  Yes  Depression 7, Hopelessness "There is no hope"  Anxiety 10  C/O uncontrollable muscle spasms, withdrawal symptoms Other:  New problem(s) identified: N/A  Reason for Continuation of Hospitalization: Depression Medication stabilization Withdrawal symptoms  Interventions implemented related to continuation of hospitalization: Medication trial of Neurontin, increase in trazodone and dilantin  Encourage group attendance and participation  Additional comments:  Estimated length of stay:2-3 days  Discharge Plan:Return home, follow up outpt  New goal(s): N/A  Review of initial/current patient goals per problem list:   1.  Goal(s):Safely detox from opiates  Met:  No  Target date:4/26  As evidenced GN:FAOZ score of 0, stable vitals  2.  Goal (s): Decrease depression  Met:  No  Target date:4/26  As evidenced HY:QMVHQ will rate his depression at a 5 or less  3.  Goal(s): Identify comprehensive sobreity plan  Met:  No  Target date:4/26  As evidenced IO:NGEX report  4.  Goal(s):  Met:  No  Target date:  As evidenced by:  Attendees: Patient:  Francis [name] 02/19/2012 10:13 AM  Family:     Physician:  Lupe Carney 02/19/2012 10:13 AM   Nursing:  Robbie Louis  02/19/2012 10:13 AM   Case Manager:  Richelle Ito, LCSW 02/19/2012 10:13 AM   Counselor:   Ronda Fairly, LCSWA 02/19/2012 10:13 AM   Other:     Other:     Other:     Other:      Scribe for Treatment Team:   Ida Rogue, 02/19/2012 10:13 AM

## 2012-02-19 NOTE — Progress Notes (Signed)
[facility] Adult Inpatient Family/Significant Other Suicide Prevention Education  Suicide Prevention Education:  Contact Attempts:Francis [name] has been identified by the patient as the family member/significant other with whom the patient will be residing, and identified as the person(s) who will aid the patient in the event of a mental health crisis.  With written consent from the patient, two attempts were made to provide suicide prevention education, prior to and/or following the patient's discharge.  We were unsuccessful in providing suicide prevention education.  A suicide education pamphlet was given to the patient to share with family/significant other.  Date and time of first attempt: 02/19/2012 10:45AM and  2:23 PM  Date and time of second attempt: 02/20/2012 6:18 PM  Patient's wife returned call, when Francis [name] found out call was to provide suicide prevention education  She asked to call back in 3 minutes.  Writer was at desk until [facility] on 02/20/12 and wife did not return call.   Writer provided suicide prevention education directly to patient; conversation included risk factors, warning signs and resources to contact for help. Mobile crisis services explained and contact card placed in chart for pt to receive at discharge.   [name] 02/21/2012, 1:00 PM

## 2012-02-19 NOTE — Progress Notes (Signed)
Pt seems a bit better today physically.  He is still very depressed and hopeless.  He wrote on his self-inventory "there is no hope"  He was talking in reference to his seizure disorder how all the medications he has tried in the past has increased his thoughts of suicide.  Discussed with him that he should try to be more optimistic since this medication is specifically for seizures.   He is still having some passive S/I but has no plan and does contract to come to staff.  Pt has brighten at times today during our interactions.  His COW was an 8.  He was started on neurontin 300 mg TID and he received his first dose at noon.  Thus far, no c/o side effects.  He has been up for his groups today and down for meals.
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
## 2012-02-19 NOTE — Progress Notes (Signed)
Cosigned by Carney Bern, LCSWA 4/24/20132:24 PM

## 2012-02-19 NOTE — Progress Notes (Signed)
[facility] MD Progress Note  02/19/2012 11:28 AM   S/O: Pacing halls. Agitated. Broke phone over night.  Patient seen and evaluated in team. Chart reviewed. Patient stated that his mood was "ok, now". His affect was mood congruent and still irritable.  Hx anger management issues and aggression.  He denied any current thoughts of self injurious behavior, suicidal ideation or homicidal ideation. There were no auditory or visual hallucinations, paranoia, delusional thought processes, or mania noted.  Thought process was linear and goal directed. His speech was normal rate, tone and volume. Eye contact was good. Judgment and insight are limited.  Patient has been up and engaged on the unit.  No acute safety concerns reported from team.  Sleep:  Number of Hours: 5.25    Vital Signs:Blood pressure 111/67, pulse 91, temperature 97.7 F (36.5 C), temperature source Oral, resp. rate 16.  Lab Results: No results found for this or any previous visit (from the past 48 hour(s)).  Physical Findings: CIWA:  CIWA-Ar Total: 11  COWS:  COWS Total Score: 12   A/P: Opioid Dependence & W/D; Benzodiazepine Abuse; Mood Disorder NOS; Seizure Disorder; r/o PD NOS with Cluster B Traits  Increased trazodone for sleep per pt request. Initiated Neurontin for c/o chronic pain and myclonic muscle contractions. Continue current treatment plan. See orders.  Medication education completed.  Pros, cons, risks, potential side effects and benefits (including no treatment) were discussed with pt.  Pt agreeable with the plan.  See orders.  Discussed with team.  Lupe Carney 02/19/2012, 11:28 AM

## 2012-02-20 MED ORDER — LITHIUM CARBONATE 300 MG PO CAPS
300.0000 mg | ORAL_CAPSULE | Freq: Two times a day (BID) | ORAL | Status: DC
  Administered 2012-02-20 – 2012-02-21 (×2): 300 mg via ORAL
  Filled 2012-02-20 (×8): qty 1

## 2012-02-20 NOTE — Progress Notes (Signed)
[facility] Group Notes:  (Counselor/Nursing/MHT/Case Management/Adjunct)  02/20/2012 6:19 PM   Type of Therapy:  Processing Group at 11:00 am  Participation Level:  Active  Participation Quality:  Sharing  Affect:  Irritable  Cognitive:  Oriented  Insight:  None shared  Engagement in Group:  Limited  Engagement in Therapy:  Poor  Modes of Intervention:  Limit setting and support  Summary of Progress/Problems:  Patient continued to share about epilepsy and problems with the medication that control seizures at every opening; as in share something about yourself that we might not learn in the first month we knew you; something you need to have in your life for balance; and something that will be an obstacle to recovery.     [facility] Group Notes:  (Counselor/Nursing/MHT/Case Management/Adjunct)  02/20/2012 6:19 PM   Type of Therapy:  Counseling Group at 1:15 pm  Participation Level:  Active  Participation Quality:  Sharing and mostly attentive  Affect:  Flat  Cognitive:  Oriented  Insight:  Limited   Engagement in Group:  Limited  Engagement in Therapy:  Limited  Modes of Intervention:  Support  Summary of Progress/Problems:  Patient again shared in his depressive manner how he is overburdened by epilepsy, cannot be left alone and has to have someone watching him all the time which limits his life style.  He also shared how he would like a new fresh start.  Ronda Fairly, [place] 02/20/2012 6:19 PM

## 2012-02-20 NOTE — Progress Notes (Signed)
[facility] MD Progress Note  02/20/2012 3:39 PM   S/O: Patient seen and evaluated in team. Chart reviewed. Patient stated that his mood was "allright". His affect was mood congruent and still irritable.  Hx anger management issues and aggression.  He denied any current thoughts of self injurious behavior, suicidal ideation or homicidal ideation. There were no auditory or visual hallucinations, paranoia, delusional thought processes, or mania noted.  Thought process was linear and goal directed. His speech was normal rate, tone and volume. Eye contact was good. Judgment and insight are limited.  Patient has been up and engaged on the unit.  No acute safety concerns reported from team.  Sleep:  Number of Hours: 4.75    Vital Signs:Blood pressure 107/77, pulse 85, temperature 98 F (36.7 C), temperature source Oral, resp. rate 16.  Lab Results: No results found for this or any previous visit (from the past 48 hour(s)).  Physical Findings: CIWA:  CIWA-Ar Total: 11  COWS:  COWS Total Score: 12   A/P: Opioid Dependence & W/D; Benzodiazepine Abuse; Mood Disorder NOS; Seizure Disorder; r/o PD NOS with Cluster B Traits  Initiated lithium 300mg  po bid for mood stabilization, agitation/aggression and irritability. Continue current treatment plan. See orders.  Medication education completed.  Pros, cons, risks, potential side effects and benefits were discussed with pt.  Pt agreeable with the plan.  See orders.  Discussed with team.  Lupe Carney 02/20/2012, 3:39 PM

## 2012-02-20 NOTE — Progress Notes (Signed)
Pt pacing halls, approaching desk requesting permission to go into dayroom or walk halls. Pt c/o feeling restless; RN offered to give pt med for anxiety pt states "I don't want any medicine, you can keep that! I walked all last night, why can't I do it tonight?" Policy reviewed with pt, who then returned to his room.

## 2012-02-20 NOTE — Progress Notes (Signed)
Pt flat, depressed, irritable.  Pt shared that he has passive SI all of the time but is able to control it.  Pt states that he is not having HI/AVH and contracts for safety.  Support and encouragement given.

## 2012-02-20 NOTE — Progress Notes (Signed)
Pt denies SI/HI/AVH. Pt states she slept and her appetite is improving. Pt states that her energy level is low and her ability to pay attention is improving. Pt rates his depression as 7 and his hopelessness as 0. Pt states on his self inventory that he wants to "divorce his wife and move away." Pt irritable and sarcastic, but cooperative today. Support and encouragement. Pt receptive.

## 2012-02-21 MED ORDER — LITHIUM CARBONATE 300 MG PO CAPS
600.0000 mg | ORAL_CAPSULE | Freq: Every day | ORAL | Status: DC
  Administered 2012-02-21 – 2012-02-23 (×3): 600 mg via ORAL
  Filled 2012-02-21 (×5): qty 2

## 2012-02-21 MED ORDER — LITHIUM CARBONATE 300 MG PO CAPS
300.0000 mg | ORAL_CAPSULE | Freq: Every day | ORAL | Status: DC
  Administered 2012-02-22 – 2012-02-24 (×3): 300 mg via ORAL
  Filled 2012-02-21 (×6): qty 1

## 2012-02-21 MED ORDER — LITHIUM CARBONATE ER 300 MG PO TBCR: 600.0000 mg | EXTENDED_RELEASE_TABLET | Freq: Every day | ORAL | Status: DC

## 2012-02-21 NOTE — Progress Notes (Signed)
[facility] Group Notes:  (Counselor/Nursing/MHT/Case Management/Adjunct)  02/21/2012 2:15 PM  Type of Therapy:  Group Therapy  Participation Level:  Minimal  Participation Quality:  Attentive  Affect:  Blunted  Cognitive:  Appropriate  Insight:  Limited  Engagement in Group:  Limited  Engagement in Therapy:  Limited  Modes of Intervention:  Education and Exploration  Summary of Progress/Problems: In discussing relapse prevention and radical acceptance of the past, patient questioned "isn't that a form of avoidance". Patient then questioned "are you saying that our past keeps [place] stuck?" Patient seemed to relate well to peers in discussing relapse prevention.   Wilmon Arms 02/21/2012, 2:15 PM

## 2012-02-21 NOTE — Progress Notes (Signed)
Pt is anxious this am and c/o chilling and cravings r/t opiate withdrawal. Support and encouragement given.  Rates depression at 6 and hopelessness at 7.  "Off and on" SI and contracts for safety.  Reports poor sleep and improving appetite. Behavior is appropriate to situation. Cont currnt POC and 15' checks for safety.

## 2012-02-21 NOTE — Progress Notes (Signed)
Pt awake, restless, c/o inability to sleep and unable to sit still. PRN vistaril given for anxiety. Pt cooperative with assessment. No distress noted, respirations regular and unlabored.

## 2012-02-21 NOTE — Progress Notes (Signed)
Patient ID: Francis [name], male   DOB: 1971/03/30, 41 y.o.   MRN: [id number] Fully alert, cooperative, with blunt affect, adequate eye contact.  Pleasant.  Admits that he has been getting very irritated by the manner his speech of another patient that his remaining on the unit. He is encountering another patient in the cafeteria at meal time and he feels irritated by this patient's manner and speech pattern. At times he feels so irritated with him that he wants to "rip his throat out.". However, he has been controlling himself and denies intention to hurt this individual.  Had thoughts of just getting up and leaving.    Today he reports that he has anger and irritability sometimes 10 greater than 10 times, anxiety 10 greater than 10, suicidal thoughts every day but denies any intent to harm himself today. Depression, he is unable to give me a score, but states that he is not sad today. Denies auditory and visual hallucinations. Denies intent or plan to harm himself or anyone else.   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 He is currently married to his second wife for the past 8-9 years, and now believes that controls will be taking his marriage from him. His wife also uses drugs. Is planning to return home, as his life with her is falling apart.  Mental status exam: Fully alert male, pleasant, cooperative, good eye contact. Hygiene is adequate, dress is appropriate. Speech is articulate, normal in pace, tone, and production. Intelligence average. Cognition intact. Reports a lot of irritable mood is calm and cooperative as he speaks to me and affect is neutral. Denies any intent to harm himself or anyone else. Thinking is nonpsychotic, denies any hallucinations. Thinking is goal-directed. Does not appear internally distracted. Concentration good, no restlessness or signs of impatience through our 30 minute interview.  Assessment: Mood disorder NOS; Opiate Dependence;  Personality Disorder NOS with antisocial traits.   Plan: Discussed measures to control his irritability regarding other patients.  Given permission to take his meals in his room as desired.   Will refer to Beryle Beams MD, Neurologist, in [place], for follow-up with restless leg symptoms and seizure disorder. Increase Lithium to 300mg  q am and 600mg  q evening meal.   Baseline EKG, Ferritin Level to r/o restless leg syndrome, Dilantin Level, and Lithium Level and CMET Monday am.   I have reviewed my findings and the plan with Dr. Koren Shiver and she has indicated her agreement.

## 2012-02-21 NOTE — Progress Notes (Signed)
[facility] Group Notes:  (Counselor/Nursing/MHT/Case Management/Adjunct)  02/21/2012 8:00 PM  Type of Therapy:  Group Therapy at 1:15  Participation Level:  Active  Participation Quality:  Attentive and Sharing  Affect:  Depressed and Flat  Cognitive:  Oriented  Insight:  Limited  Engagement in Group:  Limited  Engagement in Therapy:  Limited  Modes of Intervention:  Education, Socialization and Support  Summary of Progress/Problems:  Clinical research associate provided education on Post Acute Withdrawal Syndrome (PAWS).  Francis [name] appeared to identify and  shared that "it takes longer to recover than it takes to relapse" and "prepare for the worst and hope for the best."   [name] 02/21/2012, 8:00 PM

## 2012-02-21 NOTE — Discharge Planning (Signed)
Pt present in morning group. Reports poor sleep but states that it is normal for him to not sleep. Discussed his desire to get stabilized on appropriate medications to deal with his seizure disorder and chronic pain. States that his mood is improving on a daily basis. Currently denies SI and is able to contract for safety.

## 2012-02-22 NOTE — Progress Notes (Signed)
  Francis [name] is a 41 y.o. male [id number] 1971-07-30  02/17/2012 Active Problems:  * No active hospital problems. *    Mental Status: Active in [place] not suicidal/homicidal or psychotic.    Subjective/Objective: No reports of conflict with other today. Says he paced the floor a lot last night but doesn't sleep well.     Filed Vitals:   02/22/12 0701  BP: 111/77  Pulse: 98  Temp:   Resp:     Lab Results:   BMET    Component Value Date/Time   NA 137 02/16/2012 1716   K 3.8 02/16/2012 1716   CL 100 02/16/2012 1716   CO2 22 02/16/2012 1716   GLUCOSE 98 02/16/2012 1716   BUN 17 02/16/2012 1716   CREATININE 0.84 02/16/2012 1716   CALCIUM 10.3 02/16/2012 1716   GFRNONAA >90 02/16/2012 1716   GFRAA >90 02/16/2012 1716    Medications:  Scheduled:     . cloNIDine  0.1 mg Oral BH-qamhs   Followed by  . cloNIDine  0.1 mg Oral QAC breakfast  . gabapentin  300 mg Oral TID  . lithium carbonate  300 mg Oral Daily  . lithium carbonate  600 mg Oral q1800  . nicotine  21 mg Transdermal Q0600  . phenytoin  300 mg Oral QHS  . traZODone  100 mg Oral QHS  . DISCONTD: lithium carbonate  600 mg Oral q1800  . DISCONTD: lithium carbonate  300 mg Oral BID WC     PRN Meds acetaminophen, alum & mag hydroxide-simeth, dicyclomine, hydrOXYzine, loperamide, magnesium hydroxide, methocarbamol, nicotine polacrilex, ondansetron  Plan: No med changes indicated. Continue with current plan of care.  Francis [name],MICKIE D. 02/22/2012

## 2012-02-22 NOTE — Progress Notes (Signed)
[facility] Group Notes:  (Counselor/Nursing/MHT/Case Management/Adjunct)  02/22/2012 7:38 PM  Type of Therapy:  Psychoeducational Skills  Participation Level:  Active  Participation Quality:  Appropriate  Affect:  Appropriate  Cognitive:  Appropriate  Insight:  Good  Engagement in Group:  Good  Engagement in Therapy:  Good  Modes of Intervention:  Education  Summary of Progress/Problems:Pt did well at identifying needs and healthy and unhealthy behaviors for meeting them. Pt participation was appropriate.   Dalia Heading 02/22/2012, 7:38 PM

## 2012-02-22 NOTE — Progress Notes (Signed)
Patient ID: Francis [name], male   DOB: 02/12/1971, 41 y.o.   MRN: [id number]  [facility] Group Notes:  (Counselor/Nursing/MHT/Case Management/Adjunct)  02/22/2012 1:15 PM  Type of Therapy:  Group Therapy, Dance/Movement Therapy   Participation Level:  Active  Participation Quality:  Appropriate, Attentive, Sharing and Supportive  Affect:  Appropriate  Cognitive:  Appropriate  Insight:  Limited  Engagement in Group:  Good  Engagement in Therapy:  Good  Modes of Intervention:  Clarification, Problem-solving, Role-play, Socialization and Support  Summary of Progress/Problems: Group explored concepts of seeing where each person is in their recovery and identifying where each person needs to go to have lasting sobriety. Individals were invited to draw a mountain and place them selves on the mountain. Inividuals then described personal challenges and spoke about personal fears. Pt stated that he feels like he is "moving piles of dirt". Pt spoke about struggling to see his recovery in metaphor but knowing he must take action to make positive changes.

## 2012-02-22 NOTE — Progress Notes (Signed)
Francis [name] has been out on the unit interacting with peers and attending groups.  Continues to battle with his addiction and does admit to being "hopeless" with his wife's addiction and his desire to get clean.  Was supported by the group.  Less focused on physical problems this shift.  Chills and cravings r/t withdrawal reported. No prn medications requested. Cont to educate and support.  Cont current POC and 15' checks for safety.

## 2012-02-22 NOTE — Progress Notes (Signed)
Patient ID: Francis [name], male   DOB: 03-09-1971, 41 y.o.   MRN: [id number] Pt. attended and participated in aftercare planning group. Pt.  accepted information on suicide prevention, warning signs to look for with suicide and crisis line numbers to use. The pt. agreed to call crisis line numbers if having warning signs or having thoughts of suicide. Pt. listed their current anxiety level as 0 and depression as a 0. Pt's affect did not match the report of 0 and 0, pt was quite, isolating and looked nervious.

## 2012-02-23 NOTE — Progress Notes (Signed)
Pt awake c/o right leg cramps and restlessness, stating he has experienced this before when coming off of drugs. PRN Vistaril and tylenol given as ordered.

## 2012-02-23 NOTE — Progress Notes (Signed)
Patient ID: DEKE TILGHMAN, male   DOB: 06-02-1971, 41 y.o.   MRN: [id number]  [facility] Group Notes:  (Counselor/Nursing/MHT/Case Management/Adjunct)  02/23/2012 1:15 PM  Type of Therapy:  Group Therapy, Dance/Movement Therapy   Participation Level:  Minimal  Participation Quality:  Attentive  Affect:  Appropriate  Cognitive:  Appropriate  Insight:  Limited  Engagement in Group:  Limited  Engagement in Therapy:  Limited  Modes of Intervention:  Clarification, Problem-solving, Role-play, Socialization and Support  Summary of Progress/Problems:    Therapist discussed the definition of healthy supports. Therpist asked group " how can you distinguish between good and unhealthy support.  Additionally, therapist passed out a "Sheduling" worksheet that can allow each individual to list  positive activities that will aid in their personal  journey to recovery.  Therapist asked group to name one positive support to seek out in times of need . Pt. Stated" support group or AA meetings." Pt. ss aware the advantages of a support system Rhunette Croft

## 2012-02-23 NOTE — Progress Notes (Signed)
Patient ID: Francis [name], male   DOB: 06/01/1971, 41 y.o.   MRN: [id number] Pt. attended and participated in aftercare planning group. Pt. accepted information on suicide prevention, warning signs to look for with suicide and crisis line numbers to use. The pt. agreed to call crisis line numbers if having warning signs or having thoughts of suicide. Pt. listed their current anxiety level as 2 and depression as a 2 on a scale of 1 to 10 with 10 as the high.

## 2012-02-23 NOTE — Progress Notes (Signed)
  Francis [name] is a 41 y.o. male [id number] 30-Mar-1971  02/17/2012 Active Problems:  * No active hospital problems. *    Mental Status: Mood remains allright today. Denies SI/HI/AVH or withdrawal.     Subjective/Objective: Feels more social on the Lithium. Continues to report R leg pain and reviewed plan for him to see Dr. Pierre Bali in [place]. Not in a hurry for discharge.   Filed Vitals:   02/23/12 0701  BP: 146/89  Pulse: 114  Temp:   Resp:     Lab Results:   BMET    Component Value Date/Time   NA 137 02/16/2012 1716   K 3.8 02/16/2012 1716   CL 100 02/16/2012 1716   CO2 22 02/16/2012 1716   GLUCOSE 98 02/16/2012 1716   BUN 17 02/16/2012 1716   CREATININE 0.84 02/16/2012 1716   CALCIUM 10.3 02/16/2012 1716   GFRNONAA >90 02/16/2012 1716   GFRAA >90 02/16/2012 1716    Medications:  Scheduled:     . cloNIDine  0.1 mg Oral QAC breakfast  . gabapentin  300 mg Oral TID  . lithium carbonate  300 mg Oral Daily  . lithium carbonate  600 mg Oral q1800  . nicotine  21 mg Transdermal Q0600  . phenytoin  300 mg Oral QHS  . traZODone  100 mg Oral QHS     PRN Meds acetaminophen, alum & mag hydroxide-simeth, dicyclomine, hydrOXYzine, loperamide, magnesium hydroxide, methocarbamol, nicotine polacrilex, ondansetron Continue current plan of care.  Francis [name],Francis [name]. 02/23/2012

## 2012-02-23 NOTE — Progress Notes (Signed)
Francis [name] is interacting with peers and attending groups with active participation.  Continues to have multiple withdrawal symptoms but is managing with scheduled medications.  Rates depression at 2 and states he is "hopeless".  Is becoming more insightful with addiction issues. Reports low energy and poor sleep.  Support and encouragement offered and 15' checks cont for safety.

## 2012-02-24 LAB — COMPREHENSIVE METABOLIC PANEL
ALT: 49 U/L (ref 0–53)
AST: 25 U/L (ref 0–37)
Albumin: 4.6 g/dL (ref 3.5–5.2)
CO2: 30 mEq/L (ref 19–32)
Chloride: 100 mEq/L (ref 96–112)
Creatinine, Ser: 0.99 mg/dL (ref 0.50–1.35)
Sodium: 139 mEq/L (ref 135–145)
Total Bilirubin: 0.3 mg/dL (ref 0.3–1.2)

## 2012-02-24 LAB — LITHIUM LEVEL: Lithium Lvl: 0.62 mEq/L — ABNORMAL LOW (ref 0.80–1.40)

## 2012-02-24 LAB — PHENYTOIN LEVEL, TOTAL: Phenytoin Lvl: 13.8 ug/mL (ref 10.0–20.0)

## 2012-02-24 MED ORDER — LITHIUM CARBONATE 300 MG PO CAPS
600.0000 mg | ORAL_CAPSULE | Freq: Two times a day (BID) | ORAL | Status: DC
  Administered 2012-02-24 – 2012-02-27 (×7): 600 mg via ORAL
  Filled 2012-02-24 (×3): qty 2
  Filled 2012-02-24 (×3): qty 56
  Filled 2012-02-24 (×5): qty 2
  Filled 2012-02-24: qty 56

## 2012-02-24 MED ORDER — TRAZODONE HCL 150 MG PO TABS
150.0000 mg | ORAL_TABLET | Freq: Every day | ORAL | Status: DC
Start: 2012-02-24 — End: 2012-02-28
  Administered 2012-02-24 – 2012-02-26 (×3): 150 mg via ORAL
  Filled 2012-02-24 (×6): qty 1

## 2012-02-24 NOTE — Progress Notes (Signed)
[facility] MD Progress Note  02/24/2012 4:18 PM   S/O: Patient seen and evaluated. Chart reviewed. Patient stated that his mood was "better". His affect was mood congruent and less irritable.  Nevertheless, pt with continued episodic SI and willing to continue increase in meds for mood stability and sleep.  He denied any current thoughts of self injurious behavior, suicidal ideation or homicidal ideation. There were no auditory or visual hallucinations, paranoia, delusional thought processes, or mania noted.  Thought process was linear and goal directed.  His speech was normal rate, tone and volume. Eye contact was good. Judgment and insight are fair.  Patient has been up and engaged on the unit.    Sleep:  Number of Hours: 2.25    Vital Signs:Blood pressure 123/85, pulse 120, temperature 97.5 F (36.4 C), temperature source Oral, resp. rate 20.  Lab Results:  Results for orders placed during the hospital encounter of 02/17/12 (from the past 48 hour(s))  FERRITIN     Status: Normal   Collection Time   02/24/12  6:31 AM      Component Value Range Comment   Ferritin 62  22 - 322 (ng/mL)   LITHIUM LEVEL     Status: Abnormal   Collection Time   02/24/12  6:31 AM      Component Value Range Comment   Lithium Lvl 0.62 (*) 0.80 - 1.40 (mEq/L)   PHENYTOIN LEVEL, TOTAL     Status: Normal   Collection Time   02/24/12  6:31 AM      Component Value Range Comment   Phenytoin Lvl 13.8  10.0 - 20.0 (ug/mL)   COMPREHENSIVE METABOLIC PANEL     Status: Normal   Collection Time   02/24/12  6:31 AM      Component Value Range Comment   Sodium 139  135 - 145 (mEq/L)    Potassium 4.3  3.5 - 5.1 (mEq/L)    Chloride 100  96 - 112 (mEq/L)    CO2 30  19 - 32 (mEq/L)    Glucose, Bld 86  70 - 99 (mg/dL)    BUN 19  6 - 23 (mg/dL)    Creatinine, Ser 1.61  0.50 - 1.35 (mg/dL)    Calcium 09.6  8.4 - 10.5 (mg/dL)    Total Protein 8.1  6.0 - 8.3 (g/dL)    Albumin 4.6  3.5 - 5.2 (g/dL)    AST 25  0 - 37 (U/L)    ALT 49   0 - 53 (U/L)    Alkaline Phosphatase 87  39 - 117 (U/L)    Total Bilirubin 0.3  0.3 - 1.2 (mg/dL)    GFR calc non Af Amer >90  >90 (mL/min)    GFR calc Af Amer >90  >90 (mL/min)     Physical Findings: CIWA:  CIWA-Ar Total: 5  COWS:  COWS Total Score: 5   A/P: Opioid Dependence; Benzodiazepine Abuse; Mood Disorder NOS; Seizure Disorder; PD NOS with Cluster B Traits  Increased Lithium to 600mg  po bid for mood stabilization, agitation/aggression and irritability. Increased Trazodone to 150mg  po qhs for sleep. Continue current treatment plan.  Pt still in need of inpt crisis stabilization and Tx. See orders.  Medication education completed.  Pros, cons, risks, potential side effects and benefits were discussed with pt.  Pt agreeable with the plan.  See orders.  Discussed with team.  Lupe Carney 02/24/2012, 4:18 PM

## 2012-02-24 NOTE — Progress Notes (Signed)
Currently resting quietly in bed in right lateral position with eyes closed. Respirations are even and unlabored. No acute distress/pain/discomfort noted. Safety has been maintained with Q15 minute observation. Will continue current POC.

## 2012-02-24 NOTE — Progress Notes (Signed)
[facility] Group Notes:  (Counselor/Nursing/MHT/Case Management/Adjunct)  02/24/2012 12:54 PM   Type of Therapy:  Processing Group at 11:00 am  Participation Level:  Minimal  Participation Quality:  Attentive  Affect:  Appropriate  Cognitive:  Oriented  Insight:  None shared  Engagement in Group:  Minimal  Engagement in Therapy:  Limited  Modes of Intervention:  Exploration, support, and problem solving  Summary of Progress/Problems:  Group topic of focus was overcoming Obstacles.  Patients shared obstacles related to family and relationships, home environment or lack thereof, boredom and motivation.  Francis [name] was attentive to conversation yet did not contribute to discussion.     [facility] Group Notes:  (Counselor/Nursing/MHT/Case Management/Adjunct)  02/24/2012    Type of Therapy:  Counseling Group at 1:15 pm  Participation Level:  Good  Participation Quality:  Attentive  Affect:  Appropriate  Cognitive:  Appropriate  Insight:  Limited  Engagement in Group:  Good   Engagement in Therapy:  Good  Modes of Intervention:  Education, socialization  Summary of Progress/Problems:  Francis [name] was attentive to presentation by representative from [employer] [facility] [employer].  He expressed interest in supports offered there and shared with entire group how he is feeling more connected vs isolative and victimized.  Later after group patient asked counselor "Ilsa Iha got her to come in just for me right?"  Ronda Fairly, LCSWA 02/24/2012 3:46 PM

## 2012-02-24 NOTE — Discharge Planning (Signed)
Francis [name] stated he is doing "remarkably well" today.  No SI, thinking clearly.  Still contemplating going to stay with sister in [place].  Encouraged him to finalize plan today.  I will set him up with services if he goes to stay with sister.

## 2012-02-25 DIAGNOSIS — F39 Unspecified mood [affective] disorder: Secondary | ICD-10-CM | POA: Diagnosis present

## 2012-02-25 MED ORDER — OLANZAPINE 5 MG PO TABS
5.0000 mg | ORAL_TABLET | Freq: Every day | ORAL | Status: DC
  Administered 2012-02-25 – 2012-02-26 (×2): 5 mg via ORAL
  Filled 2012-02-25 (×2): qty 14
  Filled 2012-02-25 (×3): qty 1

## 2012-02-25 NOTE — Progress Notes (Signed)
Late entry for 4/29013 Pt has been up and active in the milieu today.  He denies any depression or anxiety on his self-inventory and on hopelessness he wrote "I am starting to understand hope"  He did admit to some passive S/I has no plan and still contracts for safety on the unit.  He also wrote "I am going  what I have learned here and use it in the world.  Pt is still not clear where he plans to go at discharge.  His case manager is working with him on his plan so services can be arranged.

## 2012-02-25 NOTE — Progress Notes (Signed)
[facility] Group Notes:  (Counselor/Nursing/MHT/Case Management/Adjunct)  02/25/2012 3:03 PM  Type of Therapy:  Group Therapy  Participation Level:  Minimal  Participation Quality:  Attentive and Sharing  Affect:  Anxious  Cognitive:  Alert and Oriented  Insight:  Limited  Engagement in Group:  Limited  Engagement in Therapy:  Limited  Modes of Intervention:  Clarification, Socialization and Support  Summary of Progress/Problems:  Francis [name] sat next to facilitator and was physically anxious throughout group. Focus of this processing group was feelings about diagnosis; discussion included denial, bargaining, anger, depression, and acceptance to which patients related to in varying degrees.   Francis [name] shared little other than small comments towards others.    [name] 02/25/2012, 3:03 PM

## 2012-02-25 NOTE — Progress Notes (Signed)
Pt rated his depression a 0 and denied any anxiety on his self-inventory. He stated,my anxiety comes and goes". Today, he rated his hopelessness a 7 he stated,"I am taking baby steps to get a grip on hope"  He went on to say that "to be more conscious of myself and make it one day at a time"  Pt then during our 1:1 discussion started to say things like "I thought it was just me not everyone else" tried to get him to elaborate on what he meant, he started becoming more evasive as if he was trying to manipulate the interaction.  He did admit to having passive S/I "off and on" he denied any plan while we were talking and he still felt he could come to staff.  However, he would smile while he was talking and it was inappropriate during the subject we were talking about.  He did come into treatment team and at first denied S/I but once he was reminded by this nurse what he had checked off on his own self-inventory.  He pointed at me with a scowl/smile on his face and again as he was leaving treatment team.  He is to start zyprexa tonight and has a lab draw for lithium level on 5/2.  Pt voiced understanding.

## 2012-02-25 NOTE — Treatment Plan (Signed)
Interdisciplinary Treatment Plan Update (Adult)  Date: 02/25/2012  Time Reviewed: 8:10 AM   Progress in Treatment: Attending groups: Yes Participating in groups: Yes Taking medication as prescribed: Yes Tolerating medication: Yes   Family/Significant other contact made:   Patient understands diagnosis:  Yes Discussing patient identified problems/goals with staff:  Yes Medical problems stabilized or resolved:  Yes Denies suicidal/homicidal ideation: No  States he still has intermittent thoughts Issues/concerns per patient self-inventory:  Yes  Depression 0, hoplessness 7  C/o withdrawal symptoms, dizziness, headaches Other:  New problem(s) identified: N/A  Reason for Continuation of Hospitalization: Medication stabilization  Interventions implemented related to continuation of hospitalization: Medication trial of zyprexa for sleep, mood management  Get li level  Additional comments:  Estimated length of stay: 1 day  Discharge Plan: Stay with family members for a month until he can get his own place  Follow up at [facility] clinic, Floydene Flock,  Dr Johnathan Hausen  New goal(s): N/A  Review of initial/current patient goals per problem list:   1.  Goal(s): Safely detox from opiates  Met:  Yes  Target date:4/27  As evidenced by: stable vitals, COWS score of 0  2.  Goal (s): Decrease depression  Met:  Yes  Target date:4/30  As evidenced by: Score of 1 on self inventory  3.  Goal(s): Identify comprehensive sobriety plan  Met:  Yes  Target date:4/30  As evidenced by: Live with family rather than returning home, attend NA mtgs regularly, get a sponsor  4.  Goal(s): Increase sleep  Met:  No  Target date:5/1  As evidenced XB:MWUXLKG trial that increases sleep from current level of 2 hours to 4 hours or more  Attendees: Patient:  Francis [name] 02/25/2012 8:10 AM  Family:     Physician:  Lupe Carney 02/25/2012 8:10 AM   Nursing:  Robbie Louis  02/25/2012 8:10 AM     Case Manager:  Richelle Ito, LCSW 02/25/2012 8:10 AM   Counselor:  Ronda Fairly, LCSWA 02/25/2012 8:10 AM   Other:     Other:     Other:     Other:      Scribe for Treatment Team:   Ida Rogue, 02/25/2012 8:10 AM

## 2012-02-25 NOTE — Progress Notes (Signed)
[facility] MD Progress Note  02/25/2012 12:53 PM   S/O: Patient seen and evaluated. Chart reviewed. Patient stated that his mood was "better". His affect was mood congruent and less irritable.  Nevertheless, pt with continued episodic SI this am with plan.  Pt willing to continue increase in meds for mood stability and sleep.  He denied any current thoughts of self injurious behavior, suicidal ideation or homicidal ideation & contracted for safety. There were no auditory or visual hallucinations, paranoia, delusional thought processes, or mania noted.  Thought process was linear and goal directed.  His speech was normal rate, tone and volume. Eye contact was good. Judgment and insight are fair.  Patient has been up and engaged on the unit.  Mild agitation still noted per staff.    Sleep:  Number of Hours: 2    Vital Signs:Blood pressure 118/82, pulse 103, temperature 97.6 F (36.4 C), temperature source Oral, resp. rate 16.  Lab Results:  Results for orders placed during the hospital encounter of 02/17/12 (from the past 48 hour(s))  FERRITIN     Status: Normal   Collection Time   02/24/12  6:31 AM      Component Value Range Comment   Ferritin 62  22 - 322 (ng/mL)   LITHIUM LEVEL     Status: Abnormal   Collection Time   02/24/12  6:31 AM      Component Value Range Comment   Lithium Lvl 0.62 (*) 0.80 - 1.40 (mEq/L)   PHENYTOIN LEVEL, TOTAL     Status: Normal   Collection Time   02/24/12  6:31 AM      Component Value Range Comment   Phenytoin Lvl 13.8  10.0 - 20.0 (ug/mL)   COMPREHENSIVE METABOLIC PANEL     Status: Normal   Collection Time   02/24/12  6:31 AM      Component Value Range Comment   Sodium 139  135 - 145 (mEq/L)    Potassium 4.3  3.5 - 5.1 (mEq/L)    Chloride 100  96 - 112 (mEq/L)    CO2 30  19 - 32 (mEq/L)    Glucose, Bld 86  70 - 99 (mg/dL)    BUN 19  6 - 23 (mg/dL)    Creatinine, Ser 4.09  0.50 - 1.35 (mg/dL)    Calcium 81.1  8.4 - 10.5 (mg/dL)    Total Protein 8.1  6.0 -  8.3 (g/dL)    Albumin 4.6  3.5 - 5.2 (g/dL)    AST 25  0 - 37 (U/L)    ALT 49  0 - 53 (U/L)    Alkaline Phosphatase 87  39 - 117 (U/L)    Total Bilirubin 0.3  0.3 - 1.2 (mg/dL)    GFR calc non Af Amer >90  >90 (mL/min)    GFR calc Af Amer >90  >90 (mL/min)     Physical Findings: CIWA:  CIWA-Ar Total: 5  COWS:  COWS Total Score: 5   A/P: Opioid Dependence; Benzodiazepine Abuse; Mood Disorder NOS; Seizure Disorder; PD NOS with Cluster B Traits  Continue Lithium 600mg  po bid for mood stabilization, agitation/aggression and irritability.  Level pending am 02/27/12. continue Trazodone 150mg  po qhs for sleep.  Zyprexa added at 5mg  po qhs for further mood stabilization, decreased agitation and sleep.  Continue current treatment plan.  Pt still in need of inpt crisis stabilization and Tx.  Peer review completed with Dr. Roxan Hockey who continued authorization for tx through tomorrow.  Re-review  for Thursday pending if necessary. See orders.  Medication education completed.  Pros, cons, risks, potential side effects and benefits were discussed with pt.  Pt agreeable with the plan.  See orders.  Discussed with team.  Lupe Carney 02/25/2012, 12:53 PM

## 2012-02-25 NOTE — Progress Notes (Signed)
02/25/2012         Time: 1415      Group Topic/Focus: The focus of this group is on discussing various styles of communication and communicating assertively using 'I' (feeling) statements.  Participation Level: Active  Participation Quality: Appropriate and Sharing  Affect: Appropriate  Cognitive: Oriented   Additional Comments: Patient encouraging peers to participate, says he feels like the last three days have been great for him. Patient talked about repeated communication issues with his significant other, reports no matter what he does he doesn't feel like she listened. Group discussed communicating to the best of our abilities and asserting ourselves, while being mindful that not everyone may be receptive.   Kaziyah Parkison 02/25/2012 3:45 PM

## 2012-02-25 NOTE — Discharge Planning (Signed)
Today in group Francis [name] confirmed that he will stay with step mother for several weeks, and then possibly father until June when he will again have funds to be able to get his own place.  Is also fairly certain he and partner will not be reuniting, but plans to talk to her in person to see if she is willing to make changes so that he can remain sober and in a positive mental health space.  Likely d/c tomorrow.

## 2012-02-25 NOTE — Progress Notes (Signed)
Patient ID: Francis [name], male   DOB: [birth year], 41 y.o.   MRN: [id number] Stated he's been here for 8 days, and feels much better. "I don't know how to explain it, but now I know it's obtainable," referring to his detox. Pt plans to stay with his "dad's ex wife for a couple of weeks, and attend meetings." States he doesn't want to return to [place] because there's a lot of "stuff" there. Support and encouragement was offered.

## 2012-02-26 ENCOUNTER — Ambulatory Visit (HOSPITAL_COMMUNITY): Payer: Self-pay | Admitting: Psychology

## 2012-02-26 NOTE — Progress Notes (Signed)
[facility] Group Notes:  (Counselor/Nursing/MHT/Case Management/Adjunct)  02/26/2012 5:37 PM  Type of Therapy:  Group Therapy  Participation Level:  Active  Participation Quality:  Attentive and Intrusive  Affect:  Irritable  Cognitive:  Oriented  Insight:  Limited  Engagement in Group:  Good  Engagement in Therapy:  None  Modes of Intervention:  Limit-setting and Support  Summary of Progress/Problems:  Alphus was attentive to discussion on patterns of behavior related to "reactions" vs being honest and open.  Patient came up with argument at every turn in order to disagree with any statement made by facilitator. Limits were set with patient who became irratable.  [name] 02/26/2012,   [facility] Group Notes:  (Counselor/Nursing/MHT/Case Management/Adjunct)  02/26/2012   Type of Therapy:  Group Therapy  Participation Level:  Active  Participation Quality:  Attentive and Sharing  Affect:  Irritable  Cognitive:  Alert and Oriented  Insight:  Limited  Engagement in Group:  Limited  Engagement in Therapy:  Limited  Modes of Intervention:  Clarification, Limit-setting and Socialization  Summary of Progress/Problems:  Patient was mostly quiet during group until time to close at which time he attempted to again ask "what if" questions of facilitator; patient's questions were seen as derailment techniques and way to avert focus from his own situation.  When patient was told counselor would not be willing to answer his questions but more than willing to listen to and or read anything he chooses to write about the questions tomorrow patient stated "there you go again with the script."  This was patient's acqusation when he first arrived.    [name] 02/26/2012, 6:17 PM

## 2012-02-26 NOTE — Progress Notes (Signed)
Francis [name] (patient's ex mother in law at [phone] and [phone]) called in response to what she believed to be patient's desperate message to come and pick him up at 6:30 AM as he would be put out on the streets. Ms Rudin was assured that patient would not be discharged to streets nor at 6:30 AM but was asked if she was okay with him discharging to her home.  Eber Jones reports that things have changed since patient was admitted and his coming there will not be a good fit as she recently had surgery and is still on pain meds and Xanax.   Ms Murakami reports concern that patient was not referred for inpatient treatment and states that patient's father's home may/may not be a good fit for pt upon discharge.   [name] 02/26/2012 6:27 PM

## 2012-02-26 NOTE — Progress Notes (Signed)
Patient ID: GRIFFEY NICASIO, male   DOB: 1971-04-19, 41 y.o.   MRN: [id number]  Writer asked the pt about a conversation he'd had with the previous RN. Informed him that the previous RN found it difficult to follow him, but that she was informed by the pt, that the writer understood him perfectly "the night before."  "What? Where did she get that from?" "You told her that I understood, so if you remind me then maybe I can explain to her. "Oh, what me and her was talking about was completely different. Me and you was discussing this stuff (waving his and referencing [facility]). What me and her talked about has nothing to do with this stuff. We're on another level".

## 2012-02-26 NOTE — Progress Notes (Signed)
Pt continues to be vague, evasive and cryptic with our interactions.  He denies any depression or anxiety today on his self-inventory.  He is still avoiding the "hopeless" part of the inventory he wrote "new concept"  He still admits to some passive S/I but has no plan and does contract to come to staff.  In addition, he wrote,"to find practical application for what I have learned here" this was in response of what you plan to change when you leave.  He plans to discharge tomorrow and will be staying with someone here in [place] for a couple of weeks then he plans to go to [place] until his check arrives in June.  He is scheduled for a lithium level in the morning. He feels that the lithium is working and the zyprexa he started last night is helping him too.  He has remained up and active in the milieu today.

## 2012-02-26 NOTE — Progress Notes (Signed)
Patient ID: Francis [name], male   DOB: Aug 17, 1971, 41 y.o.   MRN: [id number] He presents with a bright affect, in full contact with reality, denying any dangerous thoughts, has a sense of humor today. He reports he slept very well last night, Zyprexa and is very pleased with how it helped him sleep. He does note that for the past 3 days he has been developing a very mild fine motor tremor which is most notable to him in his forearms and hands. Explained to him that it is most likely from the lithium. We have a lithium level planned in the morning, and after we check that, we'll make a plan on how to deal with the tremor.   Subjectively he is not complaining about it, because he feels his mood is so good on the lithium that he thinks this is a minor inconvenience that is worth the trouble since Lithium is helping his mood so much - he is pleased with his medications. Nonetheless we will reexamine this tomorrow. He denies depression, denies hopelessness, denies anxiety. His affect is brighter, no more episodes of pacing in the hall. He is attending all meals and group therapy.  Our pharmacist has priced out his medications for him so he knows what to expect. He is pleased with the results.   Plan: Lithium level in am. Thyroid Panel in am.  Baseline EKG.

## 2012-02-27 DIAGNOSIS — F1120 Opioid dependence, uncomplicated: Secondary | ICD-10-CM

## 2012-02-27 DIAGNOSIS — F39 Unspecified mood [affective] disorder: Secondary | ICD-10-CM

## 2012-02-27 LAB — T3, FREE: T3, Free: 2.6 pg/mL (ref 2.3–4.2)

## 2012-02-27 LAB — LITHIUM LEVEL: Lithium Lvl: 0.76 mEq/L — ABNORMAL LOW (ref 0.80–1.40)

## 2012-02-27 MED ORDER — PHENYTOIN SODIUM EXTENDED 300 MG PO CAPS: 300.0000 mg | ORAL_CAPSULE | Freq: Every day | ORAL | Status: DC

## 2012-02-27 MED ORDER — LITHIUM CARBONATE 600 MG PO CAPS: 600.0000 mg | ORAL_CAPSULE | Freq: Two times a day (BID) | ORAL | Status: DC

## 2012-02-27 MED ORDER — GABAPENTIN 300 MG PO CAPS: 300.0000 mg | ORAL_CAPSULE | Freq: Three times a day (TID) | ORAL | Status: DC

## 2012-02-27 MED ORDER — OLANZAPINE 5 MG PO TABS: 5.0000 mg | ORAL_TABLET | Freq: Every day | ORAL | Status: DC
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
## 2012-02-27 NOTE — Progress Notes (Signed)
[facility] Group Notes:  (Counselor/Nursing/MHT/Case Management/Adjunct)  02/27/2012 4:44 PM  Type of Therapy:  Group Therapy  Participation Level:  Active  Participation Quality:  Appropriate  Affect:  Anxious and Appropriate  Cognitive:  Alert and Oriented  Insight:  Limited  Engagement in Group:  Good  Engagement in Therapy:  Limited  Modes of Intervention:  Activity, Clarification and Socialization  Summary of Progress/Problems: Francis [name] participated in activity in which patients choose photographs to represent what their life would look and feel like were it in balance and another for out of balance. Francis [name] chose a two photos to represent when things are going well in his life. One depicted this "new path I feel like I'm on, it's not too hard or difficult and this couple which looks like they are older yet still able to move on and enjoy life."   [name] 02/27/2012, 4:49 PM

## 2012-02-27 NOTE — Progress Notes (Signed)
Reviewed and discussed discharge instructions with patient, who signed the AVS packet and verbalized understanding.  Patient pending discharge prescriptions and ride home.

## 2012-03-03 NOTE — Progress Notes (Signed)
Patient Discharge Instructions:  After Visit Summary (AVS):   Faxed to:  03/02/2012 Face Sheet:   Faxed to:  03/02/2012 Psychiatric Admission Assessment Note:   Faxed to:  03/02/2012 Suicide Risk Assessment - Discharge Assessment:   Faxed to:  03/02/2012 Faxed/Sent to the Next Level Care provider:  03/02/2012  Faxed to Dr. Gerilyn Pilgrim @ [phone] And to [facility] @ [phone]  Heloise Purpura, Eduard Clos, 03/03/2012, 3:45 PM

## 2012-03-04 ENCOUNTER — Ambulatory Visit (INDEPENDENT_AMBULATORY_CARE_PROVIDER_SITE_OTHER): Payer: Medicare Other | Admitting: Psychology

## 2012-03-04 DIAGNOSIS — F419 Anxiety disorder, unspecified: Secondary | ICD-10-CM

## 2012-03-04 DIAGNOSIS — F411 Generalized anxiety disorder: Secondary | ICD-10-CM

## 2012-03-04 DIAGNOSIS — F329 Major depressive disorder, single episode, unspecified: Secondary | ICD-10-CM

## 2012-03-04 DIAGNOSIS — F1911 Other psychoactive substance abuse, in remission: Secondary | ICD-10-CM
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
## 2012-03-17 ENCOUNTER — Ambulatory Visit (INDEPENDENT_AMBULATORY_CARE_PROVIDER_SITE_OTHER): Payer: Medicare Other | Admitting: Psychology

## 2012-03-17 DIAGNOSIS — F411 Generalized anxiety disorder: Secondary | ICD-10-CM

## 2012-03-17 DIAGNOSIS — F329 Major depressive disorder, single episode, unspecified: Secondary | ICD-10-CM

## 2012-03-17 DIAGNOSIS — F419 Anxiety disorder, unspecified: Secondary | ICD-10-CM

## 2012-03-18 ENCOUNTER — Encounter (HOSPITAL_COMMUNITY): Payer: Self-pay | Admitting: Psychology
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
## 2012-03-31 ENCOUNTER — Encounter: Payer: Self-pay | Admitting: Family Medicine

## 2012-03-31 ENCOUNTER — Ambulatory Visit (INDEPENDENT_AMBULATORY_CARE_PROVIDER_SITE_OTHER): Payer: Medicare Other | Admitting: Family Medicine

## 2012-03-31 VITALS — BP 132/78 | HR 96 | Resp 18 | Ht 69.0 in | Wt 148.0 lb

## 2012-03-31 DIAGNOSIS — F1910 Other psychoactive substance abuse, uncomplicated: Secondary | ICD-10-CM

## 2012-03-31 DIAGNOSIS — Z9889 Other specified postprocedural states: Secondary | ICD-10-CM

## 2012-03-31 DIAGNOSIS — F1123 Opioid dependence with withdrawal: Secondary | ICD-10-CM

## 2012-03-31 DIAGNOSIS — F19939 Other psychoactive substance use, unspecified with withdrawal, unspecified: Secondary | ICD-10-CM

## 2012-03-31 DIAGNOSIS — F39 Unspecified mood [affective] disorder: Secondary | ICD-10-CM

## 2012-03-31 DIAGNOSIS — Z720 Tobacco use: Secondary | ICD-10-CM | POA: Insufficient documentation

## 2012-03-31 DIAGNOSIS — G40909 Epilepsy, unspecified, not intractable, without status epilepticus: Secondary | ICD-10-CM

## 2012-03-31 DIAGNOSIS — F1193 Opioid use, unspecified with withdrawal: Secondary | ICD-10-CM

## 2012-03-31 DIAGNOSIS — G609 Hereditary and idiopathic neuropathy, unspecified: Secondary | ICD-10-CM

## 2012-03-31 DIAGNOSIS — F1120 Opioid dependence, uncomplicated: Secondary | ICD-10-CM

## 2012-03-31 DIAGNOSIS — F19239 Other psychoactive substance dependence with withdrawal, unspecified: Secondary | ICD-10-CM

## 2012-03-31 DIAGNOSIS — G629 Polyneuropathy, unspecified: Secondary | ICD-10-CM | POA: Insufficient documentation

## 2012-03-31 DIAGNOSIS — F17200 Nicotine dependence, unspecified, uncomplicated: Secondary | ICD-10-CM

## 2012-03-31 MED ORDER — PHENYTOIN SODIUM EXTENDED 300 MG PO CAPS: 300.0000 mg | ORAL_CAPSULE | Freq: Every day | ORAL | Status: DC

## 2012-03-31 NOTE — Assessment & Plan Note (Signed)
Continue neurontin 

## 2012-03-31 NOTE — Patient Instructions (Signed)
Continue your current medications  Work on the smoking ! Eat healthy foods- fruits and veggies, avoid fatty foods, drink plenty of water F/U 3 months

## 2012-03-31 NOTE — Assessment & Plan Note (Signed)
Given encouragement he is separated from wife who was helping him sell medications and getting high with him, hopefully he will make a turn for the best

## 2012-03-31 NOTE — Assessment & Plan Note (Signed)
Neck surgery in 2012, very stiff and still has problems into his legs per report, no further intervention, no narcotic meds, pt did not ask for pain meds

## 2012-03-31 NOTE — Assessment & Plan Note (Signed)
Dilantin level wnl at recent check, he will continue dilantin at current dose, we will titrate up as needed Neurology is difficult as he does not have transportation into [employer]

## 2012-03-31 NOTE — Progress Notes (Signed)
  Subjective:    Patient ID: Francis [name], male    DOB: Jul 25, 1971, 41 y.o.   MRN: [id number]  HPI Pt here to establish care, no previous PCP. 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 He was started on zyprexa, lithium, dilantin and neurontin.He has a history of bipolar and depression but he does not think so. He has had epilepsy since childhood. 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 Medications and history reviewed He is also seen by Psychologist- Arley Phenix   Review of Systems   GEN- denies fatigue, fever, weight loss,weakness, recent illness HEENT- denies eye drainage, change in vision, nasal discharge, CVS- denies chest pain, palpitations RESP- denies SOB, cough, wheeze ABD- denies N/V, change in stools, abd pain GU- denies dysuria, hematuria, dribbling, incontinence MSK- + joint pain, muscle aches, injury Neuro- denies headache, dizziness, syncope, seizure activity, +tingling in feet and legs, spasms in legs      Objective:   Physical Exam GEN- NAD, alert and oriented x3 HEENT- PERRL, EOMI, non injected sclera, pink conjunctiva, MMM, oropharynx clear, poor dentition Neck- Supple, very stiff , decreased ROM, very small scar seen on anterior neck  CVS- RRR, no murmur RESP-CTAB ABD-NABS,soft, NT,ND EXT- No edema Pulses- Radial, DP- 2+ Neuro- no focal deficits Psych- anxious appearing,normal affect, fair eye contact, no hallucinations, no apparent SI          Assessment & Plan:

## 2012-03-31 NOTE — Assessment & Plan Note (Signed)
Bipolar mood , depression, pt does not agree with diagnosis, he will have to continue f/u with psych for his lithium and zyprexa

## 2012-04-02 ENCOUNTER — Ambulatory Visit (HOSPITAL_COMMUNITY): Payer: Self-pay | Admitting: Psychology

## 2012-05-11 ENCOUNTER — Ambulatory Visit (HOSPITAL_COMMUNITY): Payer: Self-pay | Admitting: Psychology

## 2012-06-04 ENCOUNTER — Encounter (HOSPITAL_COMMUNITY): Payer: Self-pay | Admitting: *Deleted

## 2012-06-04 ENCOUNTER — Emergency Department (HOSPITAL_COMMUNITY)
Admission: EM | Admit: 2012-06-04 | Discharge: 2012-06-04 | Disposition: A | Discharge status: Home / Self Care | Payer: Medicare Other | Attending: Emergency Medicine | Admitting: Emergency Medicine

## 2012-06-04 DIAGNOSIS — F17200 Nicotine dependence, unspecified, uncomplicated: Secondary | ICD-10-CM | POA: Insufficient documentation

## 2012-06-04 DIAGNOSIS — F1110 Opioid abuse, uncomplicated: Secondary | ICD-10-CM | POA: Insufficient documentation

## 2012-06-04 DIAGNOSIS — F319 Bipolar disorder, unspecified: Secondary | ICD-10-CM | POA: Insufficient documentation

## 2012-06-04 NOTE — ED Notes (Signed)
Used 10 hours ago roxicontin.

## 2012-06-04 NOTE — ED Notes (Signed)
Pt states he does not want to stay here overnight is he is not going to get placement here tonight, dr Juleen China has been notified.

## 2012-06-04 NOTE — ED Notes (Signed)
Wants detox from opiates, cocaine.
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
## 2012-06-05 ENCOUNTER — Emergency Department (HOSPITAL_COMMUNITY)
Admission: EM | Admit: 2012-06-05 | Discharge: 2012-06-05 | Disposition: A | Discharge status: Home / Self Care | Payer: Medicare Other | Attending: Emergency Medicine | Admitting: Emergency Medicine

## 2012-06-05 ENCOUNTER — Encounter (HOSPITAL_COMMUNITY): Payer: Self-pay | Admitting: *Deleted

## 2012-06-05 DIAGNOSIS — F1110 Opioid abuse, uncomplicated: Secondary | ICD-10-CM | POA: Insufficient documentation

## 2012-06-05 DIAGNOSIS — F319 Bipolar disorder, unspecified: Secondary | ICD-10-CM | POA: Insufficient documentation

## 2012-06-05 DIAGNOSIS — F17200 Nicotine dependence, unspecified, uncomplicated: Secondary | ICD-10-CM | POA: Insufficient documentation

## 2012-06-05 DIAGNOSIS — F341 Dysthymic disorder: Secondary | ICD-10-CM | POA: Insufficient documentation

## 2012-06-05 LAB — ETHANOL: Alcohol, Ethyl (B): <11 mg/dL (ref 0–11)

## 2012-06-05 LAB — CBC WITH DIFFERENTIAL/PLATELET
Basophils Relative: 0 % (ref 0–1)
Eosinophils Absolute: 0 10*3/uL (ref 0.0–0.7)
Eosinophils Relative: 0 % (ref 0–5)
HCT: 42.8 % (ref 39.0–52.0)
Hemoglobin: 14.2 g/dL (ref 13.0–17.0)
Lymphocytes Relative: 18 % (ref 12–46)
Lymphs Abs: 1.4 10*3/uL (ref 0.7–4.0)
MCV: 84.8 fL (ref 78.0–100.0)
Monocytes Absolute: 0.4 10*3/uL (ref 0.1–1.0)
Neutro Abs: 5.6 10*3/uL (ref 1.7–7.7)
RBC: 5.05 MIL/uL (ref 4.22–5.81)
RDW: 14.5 % (ref 11.5–15.5)
WBC: 7.4 10*3/uL (ref 4.0–10.5)

## 2012-06-05 LAB — BASIC METABOLIC PANEL
BUN: 18 mg/dL (ref 6–23)
Calcium: 10.2 mg/dL (ref 8.4–10.5)
GFR calc Af Amer: >90 mL/min (ref >90)
GFR calc non Af Amer: 90 mL/min — ABNORMAL LOW (ref >90)
Potassium: 3.9 mEq/L (ref 3.5–5.1)
Sodium: 142 mEq/L (ref 135–145)

## 2012-06-05 LAB — RAPID URINE DRUG SCREEN, HOSP PERFORMED: Benzodiazepines: NOT DETECTED

## 2012-06-05 MED ORDER — LORAZEPAM 1 MG PO TABS
1.0000 mg | ORAL_TABLET | Freq: Once | ORAL | Status: AC
  Administered 2012-06-05: 1 mg via ORAL
  Filled 2012-06-05: qty 1

## 2012-06-05 MED ORDER — ONDANSETRON 8 MG PO TBDP
8.0000 mg | ORAL_TABLET | Freq: Once | ORAL | Status: AC
  Administered 2012-06-05: 8 mg via ORAL
  Filled 2012-06-05: qty 1

## 2012-06-05 MED ORDER — LORAZEPAM 1 MG PO TABS: 1.0000 mg | ORAL_TABLET | Freq: Three times a day (TID) | ORAL | Status: AC | PRN
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
## 2012-06-05 NOTE — Discharge Instructions (Signed)
meds for restlessness and anxiety. Followup [facility]
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
## 2012-06-05 NOTE — ED Notes (Addendum)
Here for detox,opioids,  Seen here yesterday , but left since no beds were available for detox.  Alert, appropriate at triage.

## 2012-06-05 NOTE — ED Notes (Signed)
Pt states that he has been using opiates for a long time and injecting them. Pt states that he was at behavioral health in May but has not been taking his regular medications but has been using drugs. Pt denies suicidal or homicidal ideations. Pt states that he would like help to come off the drugs and is interested in a long term treatment such as [facility]. Pt cooperative at this time.

## 2012-06-29 ENCOUNTER — Encounter (HOSPITAL_COMMUNITY): Payer: Self-pay | Admitting: *Deleted

## 2012-06-29 ENCOUNTER — Emergency Department (HOSPITAL_COMMUNITY): Payer: Medicare Other

## 2012-06-29 ENCOUNTER — Inpatient Hospital Stay (HOSPITAL_COMMUNITY)
Admission: EM | Admit: 2012-06-29 | Discharge: 2012-07-02 | DRG: 918 | Disposition: A | Discharge status: Home / Self Care | Payer: Medicare Other | Attending: Internal Medicine | Admitting: Internal Medicine

## 2012-06-29 DIAGNOSIS — R262 Difficulty in walking, not elsewhere classified: Secondary | ICD-10-CM | POA: Diagnosis present

## 2012-06-29 DIAGNOSIS — T426X1A Poisoning by other antiepileptic and sedative-hypnotic drugs, accidental (unintentional), initial encounter: Secondary | ICD-10-CM | POA: Diagnosis present

## 2012-06-29 DIAGNOSIS — G629 Polyneuropathy, unspecified: Secondary | ICD-10-CM

## 2012-06-29 DIAGNOSIS — Z9889 Other specified postprocedural states: Secondary | ICD-10-CM

## 2012-06-29 DIAGNOSIS — G40909 Epilepsy, unspecified, not intractable, without status epilepticus: Secondary | ICD-10-CM | POA: Diagnosis present

## 2012-06-29 DIAGNOSIS — F411 Generalized anxiety disorder: Secondary | ICD-10-CM | POA: Diagnosis present

## 2012-06-29 DIAGNOSIS — R471 Dysarthria and anarthria: Secondary | ICD-10-CM | POA: Diagnosis present

## 2012-06-29 DIAGNOSIS — T420X1A Poisoning by hydantoin derivatives, accidental (unintentional), initial encounter: Principal | ICD-10-CM | POA: Diagnosis present

## 2012-06-29 DIAGNOSIS — F1510 Other stimulant abuse, uncomplicated: Secondary | ICD-10-CM | POA: Diagnosis present

## 2012-06-29 DIAGNOSIS — F39 Unspecified mood [affective] disorder: Secondary | ICD-10-CM | POA: Diagnosis present

## 2012-06-29 DIAGNOSIS — Y92009 Unspecified place in unspecified non-institutional (private) residence as the place of occurrence of the external cause: Secondary | ICD-10-CM

## 2012-06-29 DIAGNOSIS — T50901A Poisoning by unspecified drugs, medicaments and biological substances, accidental (unintentional), initial encounter: Secondary | ICD-10-CM

## 2012-06-29 DIAGNOSIS — F17200 Nicotine dependence, unspecified, uncomplicated: Secondary | ICD-10-CM | POA: Diagnosis present

## 2012-06-29 DIAGNOSIS — F1410 Cocaine abuse, uncomplicated: Secondary | ICD-10-CM | POA: Diagnosis present

## 2012-06-29 DIAGNOSIS — Z781 Physical restraint status: Secondary | ICD-10-CM | POA: Diagnosis present

## 2012-06-29 DIAGNOSIS — Z720 Tobacco use: Secondary | ICD-10-CM | POA: Diagnosis present

## 2012-06-29 DIAGNOSIS — M25519 Pain in unspecified shoulder: Secondary | ICD-10-CM

## 2012-06-29 DIAGNOSIS — F1910 Other psychoactive substance abuse, uncomplicated: Secondary | ICD-10-CM | POA: Diagnosis present

## 2012-06-29 DIAGNOSIS — IMO0002 Reserved for concepts with insufficient information to code with codable children: Secondary | ICD-10-CM | POA: Diagnosis present

## 2012-06-29 DIAGNOSIS — F1210 Cannabis abuse, uncomplicated: Secondary | ICD-10-CM | POA: Diagnosis present

## 2012-06-29 DIAGNOSIS — T50904A Poisoning by unspecified drugs, medicaments and biological substances, undetermined, initial encounter: Secondary | ICD-10-CM

## 2012-06-29 DIAGNOSIS — F3130 Bipolar disorder, current episode depressed, mild or moderate severity, unspecified: Secondary | ICD-10-CM | POA: Diagnosis present

## 2012-06-29 LAB — HEPATIC FUNCTION PANEL
ALT: 42 U/L (ref 0–53)
AST: 27 U/L (ref 0–37)
Albumin: 3.5 g/dL (ref 3.5–5.2)
Alkaline Phosphatase: 85 U/L (ref 39–117)
Bilirubin, Direct: <0.1 mg/dL (ref 0.0–0.3)
Total Bilirubin: 0.1 mg/dL — ABNORMAL LOW (ref 0.3–1.2)
Total Protein: 6.6 g/dL (ref 6.0–8.3)

## 2012-06-29 LAB — CBC WITH DIFFERENTIAL/PLATELET
Basophils Relative: 0 % (ref 0–1)
Eosinophils Absolute: 0 10*3/uL (ref 0.0–0.7)
HCT: 38.7 % — ABNORMAL LOW (ref 39.0–52.0)
Hemoglobin: 12.4 g/dL — ABNORMAL LOW (ref 13.0–17.0)
Lymphs Abs: 1.1 10*3/uL (ref 0.7–4.0)
MCH: 28.2 pg (ref 26.0–34.0)
MCHC: 32 g/dL (ref 30.0–36.0)
Monocytes Absolute: 0.3 10*3/uL (ref 0.1–1.0)
Monocytes Relative: 7 % (ref 3–12)
Neutrophils Relative %: 70 % (ref 43–77)
RBC: 4.4 MIL/uL (ref 4.22–5.81)

## 2012-06-29 LAB — RAPID URINE DRUG SCREEN, HOSP PERFORMED
Barbiturates: NOT DETECTED
Benzodiazepines: NOT DETECTED
Cocaine: NOT DETECTED
Tetrahydrocannabinol: NOT DETECTED

## 2012-06-29 LAB — BASIC METABOLIC PANEL
BUN: 13 mg/dL (ref 6–23)
Chloride: 105 mEq/L (ref 96–112)
Creatinine, Ser: 0.88 mg/dL (ref 0.50–1.35)
GFR calc non Af Amer: >90 mL/min (ref >90)
Glucose, Bld: 91 mg/dL (ref 70–99)
Potassium: 3.6 mEq/L (ref 3.5–5.1)

## 2012-06-29 LAB — URINALYSIS, ROUTINE W REFLEX MICROSCOPIC
Bilirubin Urine: NEGATIVE
Hgb urine dipstick: NEGATIVE
Ketones, ur: NEGATIVE mg/dL
Specific Gravity, Urine: 1.015 (ref 1.005–1.030)
pH: 6 (ref 5.0–8.0)

## 2012-06-29 LAB — ETHANOL: Alcohol, Ethyl (B): <11 mg/dL (ref 0–11)

## 2012-06-29 LAB — LITHIUM LEVEL: Lithium Lvl: <0.25 meq/L — ABNORMAL LOW (ref 0.80–1.40)

## 2012-06-29 LAB — MRSA PCR SCREENING: MRSA by PCR: NEGATIVE

## 2012-06-29 MED ORDER — GABAPENTIN 300 MG PO CAPS
600.0000 mg | ORAL_CAPSULE | Freq: Three times a day (TID) | ORAL | Status: DC
  Administered 2012-06-29 – 2012-07-02 (×8): 600 mg via ORAL
  Filled 2012-06-29 (×8): qty 2

## 2012-06-29 MED ORDER — ONDANSETRON HCL 4 MG PO TABS: 4.0000 mg | ORAL_TABLET | Freq: Four times a day (QID) | ORAL | Status: DC | PRN

## 2012-06-29 MED ORDER — HALOPERIDOL LACTATE 5 MG/ML IJ SOLN
INTRAMUSCULAR | Status: AC
  Administered 2012-06-29: 5 mg via INTRAMUSCULAR
  Filled 2012-06-29: qty 1

## 2012-06-29 MED ORDER — LORAZEPAM 2 MG/ML IJ SOLN
INTRAMUSCULAR | Status: AC
  Administered 2012-06-29: 1 mg via INTRAVENOUS
  Filled 2012-06-29: qty 1

## 2012-06-29 MED ORDER — HALOPERIDOL LACTATE 5 MG/ML IJ SOLN
5.0000 mg | Freq: Once | INTRAMUSCULAR | Status: AC
  Administered 2012-06-29: 5 mg via INTRAMUSCULAR

## 2012-06-29 MED ORDER — LORAZEPAM 2 MG/ML IJ SOLN
1.0000 mg | Freq: Once | INTRAMUSCULAR | Status: AC
  Administered 2012-06-29: 1 mg via INTRAVENOUS
  Filled 2012-06-29: qty 1

## 2012-06-29 MED ORDER — ACETAMINOPHEN 325 MG PO TABS
650.0000 mg | ORAL_TABLET | ORAL | Status: DC | PRN
  Administered 2012-06-30 – 2012-07-02 (×5): 650 mg via ORAL
  Filled 2012-06-29 (×7): qty 2

## 2012-06-29 MED ORDER — ZIPRASIDONE MESYLATE 20 MG IM SOLR
20.0000 mg | Freq: Once | INTRAMUSCULAR | Status: AC
  Administered 2012-06-29: 20 mg via INTRAMUSCULAR

## 2012-06-29 MED ORDER — SODIUM CHLORIDE 0.9 % IV SOLN
Freq: Once | INTRAVENOUS | Status: AC
  Administered 2012-06-29: 13:00:00 via INTRAVENOUS

## 2012-06-29 MED ORDER — LORAZEPAM 2 MG/ML IJ SOLN
1.0000 mg | Freq: Once | INTRAMUSCULAR | Status: AC
  Administered 2012-06-29: 1 mg via INTRAVENOUS

## 2012-06-29 MED ORDER — SODIUM CHLORIDE 0.9 % IJ SOLN
3.0000 mL | Freq: Two times a day (BID) | INTRAMUSCULAR | Status: DC
  Filled 2012-06-29: qty 3

## 2012-06-29 MED ORDER — THIAMINE HCL 100 MG/ML IJ SOLN: 100.0000 mg | Freq: Every day | INTRAMUSCULAR | Status: DC

## 2012-06-29 MED ORDER — LORAZEPAM 2 MG/ML IJ SOLN
1.0000 mg | INTRAMUSCULAR | Status: DC | PRN
  Administered 2012-06-30: 2 mg via INTRAVENOUS
  Filled 2012-06-29: qty 1

## 2012-06-29 MED ORDER — SODIUM CHLORIDE 0.9 % IV SOLN
INTRAVENOUS | Status: DC
  Administered 2012-06-30 (×2): via INTRAVENOUS

## 2012-06-29 MED ORDER — SENNOSIDES-DOCUSATE SODIUM 8.6-50 MG PO TABS: 2.0000 | ORAL_TABLET | Freq: Every evening | ORAL | Status: DC | PRN

## 2012-06-29 MED ORDER — ZIPRASIDONE MESYLATE 20 MG IM SOLR
INTRAMUSCULAR | Status: AC
  Administered 2012-06-29: 20 mg via INTRAMUSCULAR
  Filled 2012-06-29: qty 20

## 2012-06-29 MED ORDER — ONDANSETRON HCL 4 MG/2ML IJ SOLN: 4.0000 mg | Freq: Four times a day (QID) | INTRAMUSCULAR | Status: DC | PRN

## 2012-06-29 MED ORDER — BACLOFEN 10 MG PO TABS
10.0000 mg | ORAL_TABLET | Freq: Three times a day (TID) | ORAL | Status: DC
  Administered 2012-06-29 – 2012-07-02 (×8): 10 mg via ORAL
  Filled 2012-06-29 (×14): qty 1

## 2012-06-29 NOTE — ED Notes (Signed)
Report called to Michelle, RN in ICU.

## 2012-06-29 NOTE — ED Notes (Signed)
Looking at patient's medication bottles, it would appear he has been doubling his Dilantin.  His friend he lives with states he believes he has been taking one AM and one PM.

## 2012-06-29 NOTE — ED Notes (Signed)
Pt continues to attempt to crawl out of bed, confused, unable to reason with pt. Comfort measures provided. Dr, Manus Gunning and [place] PA in room with pt. Additional orders given. Lab belt placed on pt

## 2012-06-29 NOTE — Progress Notes (Signed)
Pt easily arousable and alert to self. Requesting something to eat and drink. MD paged and made aware. MD stated it was ok to give pt something as long as pt awake and alert. Pt was given Sprite and jello, pt sitting up in bed calm and awake. Sitter with pt. Will continue to monitor.
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
## 2012-06-29 NOTE — Progress Notes (Signed)
Pt toleratedJello with no problems. No coughing noted. Pt resting in bed with sitter at bedside.

## 2012-06-29 NOTE — ED Notes (Signed)
Pt brought to er by friend with c/o seizure activity last night, confusion today, pt was attempting to get out of car, fell in parking lot, pt has swelling to forehead area, pt confused, combative at times. Unable to reason with pt, he keeps attempting to get out of bed, Purdy PA at bedside upon pt arrival to tx room.

## 2012-06-29 NOTE — ED Notes (Signed)
CRITICAL VALUE ALERT  Critical value received dilantin  Date of notification 09/002/2013    Time of notification 1313  Critical value read back 33.7  Nurse who received alert Dionisio Paschal  MD notified (1st page) [phone]

## 2012-06-30 DIAGNOSIS — F17200 Nicotine dependence, unspecified, uncomplicated: Secondary | ICD-10-CM

## 2012-06-30 LAB — PHENYTOIN LEVEL, TOTAL: Phenytoin Lvl: 39.2 ug/mL (ref 10.0–20.0)

## 2012-06-30 MED ORDER — NICOTINE 21 MG/24HR TD PT24
21.0000 mg | MEDICATED_PATCH | Freq: Every day | TRANSDERMAL | Status: DC
  Administered 2012-06-30 – 2012-07-02 (×3): 21 mg via TRANSDERMAL
  Filled 2012-06-30 (×3): qty 1

## 2012-06-30 MED ORDER — LORAZEPAM 1 MG PO TABS
1.0000 mg | ORAL_TABLET | ORAL | Status: DC | PRN
  Administered 2012-06-30 – 2012-07-01 (×5): 1 mg via ORAL
  Filled 2012-06-30: qty 2
  Filled 2012-06-30: qty 1
  Filled 2012-06-30 (×3): qty 2

## 2012-06-30 MED ORDER — OLANZAPINE 5 MG PO TABS
5.0000 mg | ORAL_TABLET | Freq: Every day | ORAL | Status: DC
  Administered 2012-06-30 – 2012-07-01 (×2): 5 mg via ORAL
  Filled 2012-06-30 (×2): qty 1

## 2012-06-30 MED ORDER — VITAMIN B-1 100 MG PO TABS
100.0000 mg | ORAL_TABLET | Freq: Every day | ORAL | Status: DC
  Administered 2012-06-30 – 2012-07-02 (×3): 100 mg via ORAL
  Filled 2012-06-30 (×3): qty 1

## 2012-06-30 NOTE — Progress Notes (Signed)
Pt calm and cooperative stating he wanted to go for a smoke. Informed pt that he can't smoke but I would call and get him a nicotine patch. Dr Lendell Caprice paged and made aware. New order received.

## 2012-06-30 NOTE — Progress Notes (Signed)
Overnight, no new problems. Calmer  Subjective: No complaints. Asking when he can go home. Denies having intentionally taken too much Dilantin. Was taking it twice daily instead of once daily.  Objective: Vital signs in last 24 hours: Filed Vitals:   06/30/12 0428 06/30/12 0500 06/30/12 0600 06/30/12 0730  BP:  100/63 142/90   Pulse:  84 97   Temp: 98 F (36.7 C)   98.2 F (36.8 C)  TempSrc: Oral   Oral  Resp:  12 19   Height:      Weight: 68.9 kg (151 lb 14.4 oz)     SpO2:  98% 89%    Weight change:   Intake/Output Summary (Last 24 hours) at 06/30/12 0757 Last data filed at 06/30/12 0600  Gross per 24 hour  Intake   2435 ml  Output    900 ml  Net   1535 ml   General: Asleep. Arousable. Appropriate. Lungs clear to auscultation bilaterally without wheeze rhonchi or rales Cardiovascular regular rate rhythm without murmurs gallops rubs Abdomen soft nontender nondistended Extremities no clubbing cyanosis or edema Psychiatric: Affect angry.  Lab Results: Basic Metabolic Panel:  Lab 06/29/12 1610  NA 139  K 3.6  CL 105  CO2 29  GLUCOSE 91  BUN 13  CREATININE 0.88  CALCIUM 9.0  MG --  PHOS --   Liver Function Tests:  Lab 06/29/12 1600  AST 27  ALT 42  ALKPHOS 85  BILITOT 0.1*  PROT 6.6  ALBUMIN 3.5   No results found for this basename: LIPASE:2,AMYLASE:2 in the last 168 hours No results found for this basename: AMMONIA:2 in the last 168 hours CBC:  Lab 06/29/12 1237  WBC 4.9  NEUTROABS 3.4  HGB 12.4*  HCT 38.7*  MCV 88.0  PLT 153   Cardiac Enzymes: No results found for this basename: CKTOTAL:3,CKMB:3,CKMBINDEX:3,TROPONINI:3 in the last 168 hours BNP: No results found for this basename: PROBNP:3 in the last 168 hours D-Dimer: No results found for this basename: DDIMER:2 in the last 168 hours CBG:  Lab 06/29/12 1209  GLUCAP 124*   Hemoglobin A1C: No results found for this basename: HGBA1C in the last 168 hours Fasting Lipid Panel: No  results found for this basename: CHOL,HDL,LDLCALC,TRIG,CHOLHDL,LDLDIRECT in the last 960 hours Thyroid Function Tests: No results found for this basename: TSH,T4TOTAL,FREET4,T3FREE,THYROIDAB in the last 168 hours Coagulation: No results found for this basename: LABPROT:4,INR:4 in the last 168 hours Anemia Panel: No results found for this basename: VITAMINB12,FOLATE,FERRITIN,TIBC,IRON,RETICCTPCT in the last 168 hours Urine Drug Screen: Drugs of Abuse     Component Value Date/Time   LABOPIA NONE DETECTED 06/29/2012 1245   COCAINSCRNUR NONE DETECTED 06/29/2012 1245   LABBENZ NONE DETECTED 06/29/2012 1245   AMPHETMU NONE DETECTED 06/29/2012 1245   THCU NONE DETECTED 06/29/2012 1245   LABBARB NONE DETECTED 06/29/2012 1245    Alcohol Level:  Lab 06/29/12 1237  ETH <11   Urinalysis:  Lab 06/29/12 1245  COLORURINE YELLOW  LABSPEC 1.015  PHURINE 6.0  GLUCOSEU NEGATIVE  HGBUR NEGATIVE  BILIRUBINUR NEGATIVE  KETONESUR NEGATIVE  PROTEINUR NEGATIVE  UROBILINOGEN 0.2  NITRITE NEGATIVE  LEUKOCYTESUR NEGATIVE    Micro Results: Recent Results (from the past 240 hour(s))  MRSA PCR SCREENING     Status: Normal   Collection Time   06/29/12  4:37 PM      Component Value Range Status Comment   MRSA by PCR NEGATIVE  NEGATIVE Final     Scheduled Meds:   . sodium chloride  Intravenous Once  . baclofen  10 mg Oral TID  . gabapentin  600 mg Oral TID  . haloperidol lactate  5 mg Intramuscular Once  . LORazepam  1 mg Intravenous Once  . LORazepam  1 mg Intravenous Once  . OLANZapine  5 mg Oral QHS  . sodium chloride  3 mL Intravenous Q12H  . thiamine  100 mg Oral Daily  . ziprasidone  20 mg Intramuscular Once  . DISCONTD: thiamine  100 mg Intravenous Daily   Continuous Infusions:   . sodium chloride 100 mL/hr at 06/30/12 0600   PRN Meds:.acetaminophen, LORazepam, ondansetron (ZOFRAN) IV, ondansetron, senna-docusate, DISCONTD: LORazepam Assessment/Plan: Principal Problem:  *Dilantin  toxicity Active Problems:  Epilepsy  Substance abuse  S/P cervical discectomy  Mood disorder  Tobacco user  Levels still high. Albumin normal. Continue IV hydration and inpatient monitoring until levels improved and patient is more ambulatory. Patient has been noncompliant with his lithium and Zyprexa. He is intermittently impulsive and agitated. I will resume the Zyprexa at night. Continue IV fluids. Transfer out of stepdown.  Add nicotine patch.   LOS: 1 day   Staceyann Knouff L 06/30/2012, 7:57 AM

## 2012-06-30 NOTE — Progress Notes (Signed)
Pt ambulated from room down to 2A nurses station and back to room. Pt slightly unsteady on feet, and needs someone with him for balance occasionally. Pt tolerated well. Pt returned to room.

## 2012-06-30 NOTE — Progress Notes (Signed)
Critical Dilantin of 39.2 called at 0637. Text paged MD at [phone]. Awaiting return call at this time.

## 2012-06-30 NOTE — Progress Notes (Signed)
Patient had been oriented all night x 4, until about AM when he woke back up confused. He did not know how he got to hospital or what he did to get here. He was anxious very restless. Patient requested something for nerves at this time, ativan PRN given. Asked patient to call if he needed to get up; patient agreed. Will continue to monitor patient at this time.

## 2012-06-30 NOTE — ED Provider Notes (Signed)
Medical screening examination/treatment/procedure(s) were conducted as a shared visit with non-physician practitioner(s) and myself.  I personally evaluated the patient during the encounter  Hx narcotic abuse, seizure disorder, from home with seizure yesterday, fall in parking lot today, agitation requiring sedation. Very agitated, will not stay in bed. Scalp hematomas, moving all extremities.  Glynn Octave, MD 06/30/12 [phone]

## 2012-06-30 NOTE — Progress Notes (Signed)
UR Chart Review Completed  

## 2012-07-01 LAB — GLUCOSE, CAPILLARY

## 2012-07-01 MED ORDER — POTASSIUM CHLORIDE IN NACL 20-0.9 MEQ/L-% IV SOLN
INTRAVENOUS | Status: DC
  Administered 2012-07-01: 1000 mL via INTRAVENOUS
  Administered 2012-07-01 – 2012-07-02 (×2): via INTRAVENOUS

## 2012-07-01 NOTE — Care Management Note (Signed)
    Page 1 of 1   07/02/2012     10:49:39 AM   CARE MANAGEMENT NOTE 07/02/2012  Patient:  Francis [name], NULL   Account Number:  [id number]  Date Initiated:  07/01/2012  Documentation initiated by:  Sharrie Rothman  Subjective/Objective Assessment:   Pt admitted from home with dilantin toxicity. Pt lives with friends and will return home with them at discharge.Pt is independent with ADL's.     Action/Plan:   No CM/Hh needs noted.   Anticipated DC Date:  07/03/2012   Anticipated DC Plan:  HOME/SELF CARE      DC Planning Services  CM consult      Choice offered to / List presented to:             Status of service:  Completed, signed off Medicare Important Message given?   (If response is "NO", the following Medicare IM given date fields will be blank) Date Medicare IM given:   Date Additional Medicare IM given:    Discharge Disposition:  HOME/SELF CARE  Per UR Regulation:    If discussed at Long Length of Stay Meetings, dates discussed:    Comments:  07/02/12 1048 Arlyss Queen, RN BSN CM Pt discharged home today. No CM needs.  07/01/12 1547 Arlyss Queen, RN BSN CM

## 2012-07-01 NOTE — Progress Notes (Signed)
Subjective: The patient says that he feels a little shaky. He denies chest pain, shortness of breath, nausea, or vomiting.  Objective: Vital signs in last 24 hours: Filed Vitals:   06/30/12 2000 07/01/12 0400 07/01/12 0500 07/01/12 0802  BP: 128/77 120/83  124/82  Pulse: 95 81  60  Temp: 98.5 F (36.9 C) 97.7 F (36.5 C)  97.9 F (36.6 C)  TempSrc: Oral Oral  Oral  Resp: 20 20  18   Height:      Weight:   65.7 kg (144 lb 13.5 oz)   SpO2: 95% 100%  100%    Intake/Output Summary (Last 24 hours) at 07/01/12 0943 Last data filed at 07/01/12 0800  Gross per 24 hour  Intake   3890 ml  Output    400 ml  Net   3490 ml    Weight change: -0.3 kg (-10.6 oz)  Physical exam: General: 41 year old Caucasian man laying in bed, in no acute distress. Lungs: Clear to auscultation bilaterally. Next line heart: S1, S2, with no murmurs rubs or gallops. Abdomen: Positive bowel sounds, soft, nontender, nondistended. Neurologic: He is alert and oriented x3. He has a mild resting tremor of both hands. Cranial nerves II through XII are intact. No nystagmus. He has an unsteady gait when ambulating with assistance. No focal weakness, strength is 5 over 5 throughout and symmetric.  Lab Results: Basic Metabolic Panel:  Basename 06/29/12 1237  NA 139  K 3.6  CL 105  CO2 29  GLUCOSE 91  BUN 13  CREATININE 0.88  CALCIUM 9.0  MG --  PHOS --   Liver Function Tests:  Basename 06/29/12 1600  AST 27  ALT 42  ALKPHOS 85  BILITOT 0.1*  PROT 6.6  ALBUMIN 3.5   No results found for this basename: LIPASE:2,AMYLASE:2 in the last 72 hours No results found for this basename: AMMONIA:2 in the last 72 hours CBC:  Basename 06/29/12 1237  WBC 4.9  NEUTROABS 3.4  HGB 12.4*  HCT 38.7*  MCV 88.0  PLT 153   Cardiac Enzymes: No results found for this basename: CKTOTAL:3,CKMB:3,CKMBINDEX:3,TROPONINI:3 in the last 72 hours BNP: No results found for this basename: PROBNP:3 in the last 72  hours D-Dimer: No results found for this basename: DDIMER:2 in the last 72 hours CBG:  Basename 07/01/12 0655 06/29/12 1209  GLUCAP 112* 124*   Hemoglobin A1C: No results found for this basename: HGBA1C in the last 72 hours Fasting Lipid Panel: No results found for this basename: CHOL,HDL,LDLCALC,TRIG,CHOLHDL,LDLDIRECT in the last 72 hours Thyroid Function Tests: No results found for this basename: TSH,T4TOTAL,FREET4,T3FREE,THYROIDAB in the last 72 hours Anemia Panel: No results found for this basename: VITAMINB12,FOLATE,FERRITIN,TIBC,IRON,RETICCTPCT in the last 72 hours Coagulation: No results found for this basename: LABPROT:2,INR:2 in the last 72 hours Urine Drug Screen: Drugs of Abuse     Component Value Date/Time   LABOPIA NONE DETECTED 06/29/2012 1245   COCAINSCRNUR NONE DETECTED 06/29/2012 1245   LABBENZ NONE DETECTED 06/29/2012 1245   AMPHETMU NONE DETECTED 06/29/2012 1245   THCU NONE DETECTED 06/29/2012 1245   LABBARB NONE DETECTED 06/29/2012 1245    Alcohol Level:  Basename 06/29/12 1237  ETH <11   Urinalysis:  Basename 06/29/12 1245  COLORURINE YELLOW  LABSPEC 1.015  PHURINE 6.0  GLUCOSEU NEGATIVE  HGBUR NEGATIVE  BILIRUBINUR NEGATIVE  KETONESUR NEGATIVE  PROTEINUR NEGATIVE  UROBILINOGEN 0.2  NITRITE NEGATIVE  LEUKOCYTESUR NEGATIVE   Misc. Labs:   Micro: Recent Results (from the past 240 hour(s))  MRSA  PCR SCREENING     Status: Normal   Collection Time   06/29/12  4:37 PM      Component Value Range Status Comment   MRSA by PCR NEGATIVE  NEGATIVE Final     Studies/Results: Ct Head Wo Contrast  06/29/2012  *RADIOLOGY REPORT*  Clinical Data:  Seizure.  Head injury following a fall.  CT HEAD WITHOUT CONTRAST CT CERVICAL SPINE WITHOUT CONTRAST  Technique:  Multidetector CT imaging of the head and cervical spine was performed following the standard protocol without intravenous contrast.  Multiplanar CT image reconstructions of the cervical spine were also  generated.  Comparison:  05/21/2011.  01/09/2011.  CT HEAD  Findings: No mass lesion, mass effect, midline shift, hydrocephalus, hemorrhage.  No territorial ischemia or acute infarction. Left sub insular cystic area is present, likely old lacunar infarct.  Motion artifact is present on the head CT mildly degrading the study.  Small fluid level is present in the left maxillary sinus.  This may be associated with sinusitis, pooled secretions or potentially facial trauma.  No displaced fracture is identified on this scan.  Right maxillary mucous retention cyst/polyp incidentally noted.  The calvarium is intact.  IMPRESSION: No acute intracranial abnormality. Nonspecific fluid level within the left maxillary sinus. Study mildly motion degraded.  CT CERVICAL SPINE  Findings: Craniocervical alignment is normal.  Chronic bilateral maxillary sinus disease is noted with mucous retention cyst/polyps and mucosal thickening.  Upper cervical spine and craniocervical junction are within normal limits.  C5-C6 ACDF is present.  There is no hardware failure or loosening.  No bony bridging across the disc space at this time.  The paraspinal soft tissues are within normal limits.  Cervical spinal alignment is anatomic.  The study was repeated due to motion.  IMPRESSION: No acute osseous abnormality.  C5-C6 uncomplicated ACDF.  No bridging bone across C5-C6 at this time.   Original Report Authenticated By: Andreas Newport, M.D.    Ct Cervical Spine Wo Contrast  06/29/2012  *RADIOLOGY REPORT*  Clinical Data:  Seizure.  Head injury following a fall.  CT HEAD WITHOUT CONTRAST CT CERVICAL SPINE WITHOUT CONTRAST  Technique:  Multidetector CT imaging of the head and cervical spine was performed following the standard protocol without intravenous contrast.  Multiplanar CT image reconstructions of the cervical spine were also generated.  Comparison:  05/21/2011.  01/09/2011.  CT HEAD  Findings: No mass lesion, mass effect, midline shift,  hydrocephalus, hemorrhage.  No territorial ischemia or acute infarction. Left sub insular cystic area is present, likely old lacunar infarct.  Motion artifact is present on the head CT mildly degrading the study.  Small fluid level is present in the left maxillary sinus.  This may be associated with sinusitis, pooled secretions or potentially facial trauma.  No displaced fracture is identified on this scan.  Right maxillary mucous retention cyst/polyp incidentally noted.  The calvarium is intact.  IMPRESSION: No acute intracranial abnormality. Nonspecific fluid level within the left maxillary sinus. Study mildly motion degraded.  CT CERVICAL SPINE  Findings: Craniocervical alignment is normal.  Chronic bilateral maxillary sinus disease is noted with mucous retention cyst/polyps and mucosal thickening.  Upper cervical spine and craniocervical junction are within normal limits.  C5-C6 ACDF is present.  There is no hardware failure or loosening.  No bony bridging across the disc space at this time.  The paraspinal soft tissues are within normal limits.  Cervical spinal alignment is anatomic.  The study was repeated due to motion.  IMPRESSION:  No acute osseous abnormality.  C5-C6 uncomplicated ACDF.  No bridging bone across C5-C6 at this time.   Original Report Authenticated By: Andreas Newport, M.D.     Medications:  Scheduled:   . baclofen  10 mg Oral TID  . gabapentin  600 mg Oral TID  . nicotine  21 mg Transdermal Daily  . OLANZapine  5 mg Oral QHS  . sodium chloride  3 mL Intravenous Q12H  . thiamine  100 mg Oral Daily   Continuous:   . sodium chloride 100 mL/hr at 07/01/12 0600   [id number], LORazepam, ondansetron (ZOFRAN) IV, ondansetron, senna-docusate  Assessment: Principal Problem:  *Dilantin toxicity Active Problems:  Epilepsy  S/P cervical discectomy  Mood disorder  Substance abuse  Tobacco user  The patient's Dilantin level is still quite elevated and is not decreasing  much at all. I did ask the registered nurse to search the patient to see if he had any Dilantin tablets on him. Apparently she found none. She asked him directly if he had been taking Dilantin in the hospital he stated no. We'll continue IV fluid hydration and supportive treatment. The patient was advised to stop smoking. He was also advised to start taking his medications as prescribed.  Plan:  1. Continue to check daily Dilantin levels. 2. Continue IV fluid hydration and supportive treatment. 3. Tobacco cessation counseling. Continue nicotine replacement therapy.    LOS: 2 days   Rahkeem Senft 07/01/2012, 9:43 AM

## 2012-07-02 LAB — COMPREHENSIVE METABOLIC PANEL
ALT: 33 U/L (ref 0–53)
AST: 20 U/L (ref 0–37)
Albumin: 3.5 g/dL (ref 3.5–5.2)
Alkaline Phosphatase: 83 U/L (ref 39–117)
Calcium: 9 mg/dL (ref 8.4–10.5)
Glucose, Bld: 81 mg/dL (ref 70–99)
Potassium: 3.9 mEq/L (ref 3.5–5.1)
Sodium: 145 mEq/L (ref 135–145)
Total Protein: 6.6 g/dL (ref 6.0–8.3)

## 2012-07-02 LAB — CBC
HCT: 42 % (ref 39.0–52.0)
Hemoglobin: 13.4 g/dL (ref 13.0–17.0)
MCH: 28.3 pg (ref 26.0–34.0)
MCHC: 31.9 g/dL (ref 30.0–36.0)
MCV: 88.8 fL (ref 78.0–100.0)
RDW: 14.4 % (ref 11.5–15.5)

## 2012-07-02 LAB — PHENYTOIN LEVEL, TOTAL: Phenytoin Lvl: 24.9 ug/mL — ABNORMAL HIGH (ref 10.0–20.0)

## 2012-07-02 MED ORDER — PHENYTOIN SODIUM EXTENDED 300 MG PO CAPS: 300.0000 mg | ORAL_CAPSULE | Freq: Every day | ORAL | Status: DC

## 2012-07-02 MED ORDER — OLANZAPINE 5 MG PO TABS: 5.0000 mg | ORAL_TABLET | Freq: Every day | ORAL | Status: AC

## 2012-07-02 NOTE — Progress Notes (Signed)
Discharge instructions and prescriptions given, verbalized understanding, out ambulatory in stable condition with staff. 

## 2012-07-02 NOTE — Discharge Summary (Signed)
Physician Discharge Summary  Praneeth Bussey RUE:[id number] DOB: July 04, 1971 DOA: 06/29/2012  PCP: Milinda Antis, MD  Admit date: 06/29/2012 Discharge date: 07/02/2012  Recommendations for Outpatient Follow-up:  1. The patient was discharged to home in improved condition. He will followup with his primary care physician Dr. Jeanice Lim next week and with his neurologist Dr. Gerilyn Pilgrim in 2 weeks. He was instructed to go to [facility] to followup with his psychiatrist in the next few days as needed.  Discharge Diagnoses: 1. Dilantin toxicity. The patient's phenytoin level was 33.7 on admission and 24.9 at the time of discharge. 2. Epilepsy. 3. Dysarthria and gait instability secondary to #1. Resolved. 4. Mood disorder, query bipolar disorder. 5. History of polysubstance abuse. 6. Tobacco abuse. 7. Status post cervical discectomy in the past.  Discharge Condition: Improved.  Diet recommendation: Regular as tolerated.  Filed Weights   06/29/12 1630 06/30/12 0428 07/01/12 0500  Weight: 66 kg (145 lb 8.1 oz) 68.9 kg (151 lb 14.4 oz) 65.7 kg (144 lb 13.5 oz)    History of present illness:  The patient is a 41 year old man with a history significant for epilepsy and polysubstance abuse, who presented to the emergency department on 06/29/2012 with confusion and difficulty walking. The patient takes Dilantin chronically. He is supposed to take Dilantin once daily. During the initial history gathering, it appeared that the patient had been taking it twice daily. In the emergency department, he was apparently very agitated. He was given Ativan and Geodon which caused him to be quite sedated. He was hemodynamically stable and afebrile. His lab data were unremarkable with exception of a phenytoin level that was 33.7. CT of his head revealed no acute intracranial findings. CT of the cervical spine revealed stable C5-C6 uncomplicated ACDF. He was admitted for further evaluation and management.  Hospital Course:  The  patient was initially agitated requiring chemical and physical restraints. He was admitted to assess down unit for closer monitoring. Obviously, Dilantin was discontinued. For further evaluation, a number of studies were ordered. His urine drug screen was negative. His lithium level was less than 0.25. His alcohol level was less than 11. His EKG was remarkable only for sinus tachycardia with a heart rate of 100 and per minute. During the first 24-48 hours of hospitalization, the patient became calmer. However, the Zyprexa was restarted each bedtime. Apparently, he had been taking Zyprexa and lithium prescribed by a psychiatrist, but neither one of these medications were initially part of his medication list. Lithium was not restarted because the patient was unsure of the dosing. A nicotine patch was placed. He was counseled on tobacco cessation counseling. He was also advised to seek assistance with stopping multiple illicit drugs.  Over the course of the hospitalization, the patient's phenytoin level decreased. At the time of discharge, it was approximately 25 which is still slightly above therapeutic range. However,the patient was clearly less symptomatic, less ataxic, less confused, and ambulating with no difficulty. Upon my conversation with him, he had taken twice a day dosing of Dilantin by mistake and voiced that it was not intentional. I advised him to get a pillbox to keep a closer record on his medications. He asked me for prescription for lithium but I refused it. I instructed him to followup with his primary psychiatrist at [facility] if he needed a prescription for this. He was also instructed to discuss drug drug interactions with all of his physicians given that he is on multiple psychotropic medication that can affect his memory. He  voiced understanding. He was instructed to restart taking Dilantin 1 tablet each bedtime tomorrow night.  Procedures:  None  Consultations:  None  Discharge  Exam: Filed Vitals:   07/02/12 0532  BP: 124/84  Pulse: 73  Temp: 97.7 F (36.5 C)  Resp: 20   Filed Vitals:   07/01/12 1200 07/01/12 1741 07/01/12 2120 07/02/12 0532  BP:   121/88 124/84  Pulse:   89 73  Temp: 97.7 F (36.5 C) 98.8 F (37.1 C) 98.8 F (37.1 C) 97.7 F (36.5 C)  TempSrc: Oral Oral Oral Oral  Resp:   20 20  Height:      Weight:      SpO2:   99% 100%    General: No acute distress. Cardiovascular: S1, S2, with no murmurs rubs or gallops. Respiratory: Clear to auscultation bilaterally. Neurologic: He is alert and oriented x3. Cranial nerves II through XII are intact. Gait is within normal limits.  Discharge Instructions  Discharge Orders    Future Appointments: Provider: Department: Dept Phone: Center:   07/07/2012 2:30 PM Salley Scarlet, MD [facility] [phone] [facility]     Future Orders Please Complete By Expires   Diet general      Increase activity slowly      Discharge instructions      Comments:   DO NOT RESTART TAKING DILANTIN UNTIL TOMORROW NIGHT ON 07/03/2012. TAKE ONLY ONE TABLET AT BEDTIME STARTING TOMORROW.     Medication List  As of 07/02/2012 11:04 AM   STOP taking these medications         acetaminophen 500 MG tablet         TAKE these medications         baclofen 10 MG tablet   Commonly known as: LIORESAL   Take 10 mg by mouth 3 (three) times daily.      gabapentin 600 MG tablet   Commonly known as: NEURONTIN   Take 600 mg by mouth 3 (three) times daily.      OLANZapine 5 MG tablet   Commonly known as: ZYPREXA   Take 1 tablet (5 mg total) by mouth at bedtime.      phenytoin 300 MG ER capsule   Commonly known as: DILANTIN   Take 1 capsule (300 mg total) by mouth at bedtime. RESTART TAKING DILANTIN TOMORROW NIGHT ON 07/03/2012.           Follow-up Information    Follow up with Milinda Antis, MD on 07/07/2012. (AT 2:30 PM)    Contact information:   [address] [place]  [address] [phone]       Follow up with Beryle Beams, MD on 07/21/2012. (AT 12:15 PM)    Contact information:   [address] [place] [address] [phone]           The results of significant diagnostics from this hospitalization (including imaging, microbiology, ancillary and laboratory) are listed below for reference.    Significant Diagnostic Studies: Ct Head Wo Contrast  06/29/2012  *RADIOLOGY REPORT*  Clinical Data:  Seizure.  Head injury following a fall.  CT HEAD WITHOUT CONTRAST CT CERVICAL SPINE WITHOUT CONTRAST  Technique:  Multidetector CT imaging of the head and cervical spine was performed following the standard protocol without intravenous contrast.  Multiplanar CT image reconstructions of the cervical spine were also generated.  Comparison:  05/21/2011.  01/09/2011.  CT HEAD  Findings: No mass lesion, mass effect, midline shift, hydrocephalus,  hemorrhage.  No territorial ischemia or acute infarction. Left sub insular cystic area is present, likely old lacunar infarct.  Motion artifact is present on the head CT mildly degrading the study.  Small fluid level is present in the left maxillary sinus.  This may be associated with sinusitis, pooled secretions or potentially facial trauma.  No displaced fracture is identified on this scan.  Right maxillary mucous retention cyst/polyp incidentally noted.  The calvarium is intact.  IMPRESSION: No acute intracranial abnormality. Nonspecific fluid level within the left maxillary sinus. Study mildly motion degraded.  CT CERVICAL SPINE  Findings: Craniocervical alignment is normal.  Chronic bilateral maxillary sinus disease is noted with mucous retention cyst/polyps and mucosal thickening.  Upper cervical spine and craniocervical junction are within normal limits.  C5-C6 ACDF is present.  There is no hardware failure or loosening.  No bony bridging across the disc space at this time.  The paraspinal soft tissues are  within normal limits.  Cervical spinal alignment is anatomic.  The study was repeated due to motion.  IMPRESSION: No acute osseous abnormality.  C5-C6 uncomplicated ACDF.  No bridging bone across C5-C6 at this time.   Original Report Authenticated By: Andreas Newport, M.D.    Ct Cervical Spine Wo Contrast  06/29/2012  *RADIOLOGY REPORT*  Clinical Data:  Seizure.  Head injury following a fall.  CT HEAD WITHOUT CONTRAST CT CERVICAL SPINE WITHOUT CONTRAST  Technique:  Multidetector CT imaging of the head and cervical spine was performed following the standard protocol without intravenous contrast.  Multiplanar CT image reconstructions of the cervical spine were also generated.  Comparison:  05/21/2011.  01/09/2011.  CT HEAD  Findings: No mass lesion, mass effect, midline shift, hydrocephalus, hemorrhage.  No territorial ischemia or acute infarction. Left sub insular cystic area is present, likely old lacunar infarct.  Motion artifact is present on the head CT mildly degrading the study.  Small fluid level is present in the left maxillary sinus.  This may be associated with sinusitis, pooled secretions or potentially facial trauma.  No displaced fracture is identified on this scan.  Right maxillary mucous retention cyst/polyp incidentally noted.  The calvarium is intact.  IMPRESSION: No acute intracranial abnormality. Nonspecific fluid level within the left maxillary sinus. Study mildly motion degraded.  CT CERVICAL SPINE  Findings: Craniocervical alignment is normal.  Chronic bilateral maxillary sinus disease is noted with mucous retention cyst/polyps and mucosal thickening.  Upper cervical spine and craniocervical junction are within normal limits.  C5-C6 ACDF is present.  There is no hardware failure or loosening.  No bony bridging across the disc space at this time.  The paraspinal soft tissues are within normal limits.  Cervical spinal alignment is anatomic.  The study was repeated due to motion.  IMPRESSION: No  acute osseous abnormality.  C5-C6 uncomplicated ACDF.  No bridging bone across C5-C6 at this time.   Original Report Authenticated By: Andreas Newport, M.D.     Microbiology: Recent Results (from the past 240 hour(s))  MRSA PCR SCREENING     Status: Normal   Collection Time   06/29/12  4:37 PM      Component Value Range Status Comment   MRSA by PCR NEGATIVE  NEGATIVE Final      Labs: Basic Metabolic Panel:  Lab 07/02/12 4540 06/29/12 1237  NA 145 139  K 3.9 3.6  CL 108 105  CO2 28 29  GLUCOSE 81 91  BUN 10 13  CREATININE 0.70 0.88  CALCIUM 9.0 9.0  MG 1.8 --  PHOS -- --   Liver Function Tests:  Lab 07/02/12 0508 06/29/12 1600  AST 20 27  ALT 33 42  ALKPHOS 83 85  BILITOT 0.1* 0.1*  PROT 6.6 6.6  ALBUMIN 3.5 3.5   No results found for this basename: LIPASE:5,AMYLASE:5 in the last 168 hours No results found for this basename: AMMONIA:5 in the last 168 hours CBC:  Lab 07/02/12 0508 06/29/12 1237  WBC 4.9 4.9  NEUTROABS -- 3.4  HGB 13.4 12.4*  HCT 42.0 38.7*  MCV 88.8 88.0  PLT 186 153   Cardiac Enzymes: No results found for this basename: CKTOTAL:5,CKMB:5,CKMBINDEX:5,TROPONINI:5 in the last 168 hours BNP: BNP (last 3 results) No results found for this basename: PROBNP:3 in the last 8760 hours CBG:  Lab 07/01/12 0655 06/29/12 1209  GLUCAP 112* 124*    Time coordinating discharge: Greater than 30 minutes  Signed:  Kathe Wirick  [facility] 07/02/2012, 11:04 AM

## 2012-07-07 ENCOUNTER — Ambulatory Visit (INDEPENDENT_AMBULATORY_CARE_PROVIDER_SITE_OTHER): Payer: Medicare Other | Admitting: Family Medicine

## 2012-07-07 VITALS — BP 136/74 | HR 104 | Resp 18 | Ht 69.0 in | Wt 145.1 lb

## 2012-07-07 DIAGNOSIS — F1910 Other psychoactive substance abuse, uncomplicated: Secondary | ICD-10-CM

## 2012-07-07 DIAGNOSIS — N529 Male erectile dysfunction, unspecified: Secondary | ICD-10-CM

## 2012-07-07 DIAGNOSIS — T420X1A Poisoning by hydantoin derivatives, accidental (unintentional), initial encounter: Secondary | ICD-10-CM

## 2012-07-07 DIAGNOSIS — G40909 Epilepsy, unspecified, not intractable, without status epilepticus: Secondary | ICD-10-CM

## 2012-07-07 DIAGNOSIS — F39 Unspecified mood [affective] disorder: Secondary | ICD-10-CM

## 2012-07-07 MED ORDER — SILDENAFIL CITRATE 50 MG PO TABS: 50.0000 mg | ORAL_TABLET | ORAL | Status: DC | PRN

## 2012-07-07 NOTE — Patient Instructions (Addendum)
I will make a referral for a new therapist Get the dilantin level drawn  Continue your current medications F/U 1 month

## 2012-07-08 ENCOUNTER — Encounter: Payer: Self-pay | Admitting: Family Medicine

## 2012-07-08 DIAGNOSIS — N529 Male erectile dysfunction, unspecified: Secondary | ICD-10-CM | POA: Insufficient documentation

## 2012-07-08 NOTE — Addendum Note (Signed)
Addended by: Milinda Antis F on: 07/08/2012 05:04 PM   Modules accepted: Orders

## 2012-07-08 NOTE — Assessment & Plan Note (Signed)
His ED is related to his mood disorder and stress, he can not perform with his current state, I dont think that viagra will provide much help as this is psychological but will give him a few pills to try with partner, no other contraindications with health issues

## 2012-07-08 NOTE — Assessment & Plan Note (Signed)
Recent relapse,poor insight into his medical problems and substance abuse,

## 2012-07-08 NOTE — Assessment & Plan Note (Signed)
He asked if he could restart lithium, I advised against this as this requires close monitoring and adjustments, continue zyprexa He is very anxious however he is known to sell benzo for narcotics, and I will not prescribe them for him F/u with psychiatry, I will try to get him a new therapist

## 2012-07-08 NOTE — Progress Notes (Signed)
  Subjective:    Patient ID: Francis [name], male    DOB: October 15, 1971, 41 y.o.   MRN: [id number]  HPI  pt presents as hospital f/u for dilantin toxicity. 6 weeks ago he relapsed while out with his wife and presented to ED for detox from opiates and cocaine, but left AMA. He then went to see his psychiatrist as a walkin but left after having to wait a long time. He moved in with his now male partner who accompanies him today, he states he did not want to have a seizure in front of him therefore was taking his dilantin twice a day, instead of once and began to have N/V, headache, confusion and difficulty walking, Dilantin level was 33 during admission. This was held and restarted at discharge.  He stopped his lithium after our last visit, but had been taking zyprexa, he does no think the meds can "fix him" and his drug problem is a " symptom" of his mental illness. He does not like his current therapist because he only talks about himself and called him a "failure". He is upset he can not keep an erection or ejaculate at times with his new partner   Review of Systems  GEN- denies fatigue, fever, weight loss,weakness, recent illness HEENT- denies eye drainage, change in vision, nasal discharge, CVS- denies chest pain, palpitations RESP- denies SOB, cough, wheeze ABD- denies N/V, change in stools, abd pain GU- denies dysuria, hematuria, dribbling, incontinence MSK- denies joint pain, muscle aches, injury Neuro- denies headache, dizziness, syncope, seizure activity      Objective:   Physical Exam  GEN- NAD, alert and oriented x3 HEENT- PERRL, EOMI,  MMM, oropharynx clear CVS- tachycardic, no murmur RESP-CTAB EXT- No edema Pulses- Radial 2+ Neuro- no focal deficits, no ataxia Psych- very anxious, pacing the room, poor insight, no hallucinations, no apparent SI       Assessment & Plan:

## 2012-07-08 NOTE — Assessment & Plan Note (Signed)
F/u neurology 

## 2012-07-08 NOTE — Assessment & Plan Note (Addendum)
No current signs of ataxia, will recheck level, which was noted to be decreasing at time of discharge. F/U with neurology in 2 weeks- discussed via phone  Note he is asymptomatic however level is trending up, will have him hold dose today, I will f/u with neurology in AM for further instructions

## 2012-07-09 ENCOUNTER — Telehealth: Payer: Self-pay | Admitting: Family Medicine

## 2012-07-09 ENCOUNTER — Other Ambulatory Visit: Payer: Self-pay

## 2012-07-09 MED ORDER — LEVETIRACETAM 500 MG PO TABS: 500.0000 mg | ORAL_TABLET | Freq: Two times a day (BID) | ORAL | Status: DC

## 2012-07-09 NOTE — Telephone Encounter (Signed)
I tried to call patient and left a voice message. I spoke with neurology he needs to do the following for his seizures 1. Continue dilantin at 300mg  at bedtime 2. Start new seizure medication Keppra- I have sent to pharmacy, start with 1/2 tablet twice a day for 1 week, then increase to 1 tablet twice a day He needs to f/u with Neurology- Dr. Gerilyn Pilgrim as scheduled, we will plan to get him off the Dilantin once the keppra takes effect. He will not have to have lab draws with the keppra medication

## 2012-07-09 NOTE — Telephone Encounter (Signed)
Discussed concerns with pt

## 2012-07-09 NOTE — Telephone Encounter (Signed)
Patient is aware but he is concerned about the Keppra increasing suicidal thoughts.

## 2012-07-19 ENCOUNTER — Encounter (HOSPITAL_COMMUNITY): Payer: Self-pay

## 2012-07-19 ENCOUNTER — Emergency Department (HOSPITAL_COMMUNITY): Payer: Medicare Other

## 2012-07-19 ENCOUNTER — Emergency Department (HOSPITAL_COMMUNITY)
Admission: EM | Admit: 2012-07-19 | Discharge: 2012-07-19 | Disposition: A | Discharge status: Home / Self Care | Payer: Medicare Other | Attending: Emergency Medicine | Admitting: Emergency Medicine

## 2012-07-19 DIAGNOSIS — M79609 Pain in unspecified limb: Secondary | ICD-10-CM | POA: Insufficient documentation

## 2012-07-19 DIAGNOSIS — F319 Bipolar disorder, unspecified: Secondary | ICD-10-CM | POA: Insufficient documentation

## 2012-07-19 DIAGNOSIS — Z79899 Other long term (current) drug therapy: Secondary | ICD-10-CM | POA: Insufficient documentation

## 2012-07-19 DIAGNOSIS — R4182 Altered mental status, unspecified: Secondary | ICD-10-CM | POA: Insufficient documentation

## 2012-07-19 LAB — CBC WITH DIFFERENTIAL/PLATELET
Basophils Absolute: 0 10*3/uL (ref 0.0–0.1)
Basophils Relative: 0 % (ref 0–1)
Eosinophils Relative: 1 % (ref 0–5)
HCT: 41.6 % (ref 39.0–52.0)
Hemoglobin: 13.6 g/dL (ref 13.0–17.0)
MCHC: 32.7 g/dL (ref 30.0–36.0)
MCV: 86.3 fL (ref 78.0–100.0)
Monocytes Absolute: 0.3 10*3/uL (ref 0.1–1.0)
Monocytes Relative: 5 % (ref 3–12)
Neutro Abs: 5.7 10*3/uL (ref 1.7–7.7)
RDW: 14.4 % (ref 11.5–15.5)

## 2012-07-19 LAB — COMPREHENSIVE METABOLIC PANEL
AST: 32 U/L (ref 0–37)
Albumin: 3.9 g/dL (ref 3.5–5.2)
BUN: 16 mg/dL (ref 6–23)
CO2: 25 mEq/L (ref 19–32)
Calcium: 9.2 mg/dL (ref 8.4–10.5)
Chloride: 100 mEq/L (ref 96–112)
Creatinine, Ser: 0.8 mg/dL (ref 0.50–1.35)
GFR calc non Af Amer: >90 mL/min (ref >90)
Total Bilirubin: 0.1 mg/dL — ABNORMAL LOW (ref 0.3–1.2)

## 2012-07-19 LAB — PHENYTOIN LEVEL, TOTAL: Phenytoin Lvl: 17.6 ug/mL (ref 10.0–20.0)

## 2012-07-19 NOTE — ED Notes (Signed)
Patient transported to CT 
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
## 2012-07-19 NOTE — ED Notes (Signed)
Pt reports waking this am and "feeling funny" thinks his meds are not right.  Thinks his dilantin level is too high.

## 2012-08-06 ENCOUNTER — Ambulatory Visit: Payer: Self-pay | Admitting: Family Medicine

## 2012-08-10 ENCOUNTER — Encounter: Payer: Self-pay | Admitting: Family Medicine

## 2012-08-11 ENCOUNTER — Ambulatory Visit (INDEPENDENT_AMBULATORY_CARE_PROVIDER_SITE_OTHER): Payer: Medicare Other | Admitting: Family Medicine

## 2012-08-11 ENCOUNTER — Encounter: Payer: Self-pay | Admitting: Family Medicine

## 2012-08-11 VITALS — BP 100/70 | HR 88 | Resp 15 | Ht 69.0 in | Wt 143.1 lb

## 2012-08-11 DIAGNOSIS — K047 Periapical abscess without sinus: Secondary | ICD-10-CM

## 2012-08-11 DIAGNOSIS — K044 Acute apical periodontitis of pulpal origin: Secondary | ICD-10-CM

## 2012-08-11 DIAGNOSIS — G40909 Epilepsy, unspecified, not intractable, without status epilepticus: Secondary | ICD-10-CM

## 2012-08-11 DIAGNOSIS — F39 Unspecified mood [affective] disorder: Secondary | ICD-10-CM

## 2012-08-11 MED ORDER — AMOXICILLIN 500 MG PO CAPS: 500.0000 mg | ORAL_CAPSULE | Freq: Three times a day (TID) | ORAL | Status: AC

## 2012-08-11 NOTE — Patient Instructions (Addendum)
Take the antibiotics as prescribed New referral for therapist Call or go by [facility] today for appt Continue seizure medication Warm salt water gargles F/U 3 months

## 2012-08-13 DIAGNOSIS — K047 Periapical abscess without sinus: Secondary | ICD-10-CM | POA: Insufficient documentation

## 2012-08-13 NOTE — Assessment & Plan Note (Signed)
He has used mouth wash,peroxide rinse. Antibtioics, warm salt water

## 2012-08-13 NOTE — Addendum Note (Signed)
Addended by: Milinda Antis F on: 08/13/2012 09:37 PM   Modules accepted: Orders

## 2012-08-13 NOTE — Assessment & Plan Note (Signed)
No recent seizure, reviewed neurology note, doing well on Keppra

## 2012-08-13 NOTE — Progress Notes (Signed)
  Subjective:    Patient ID: Francis [name], male    DOB: 03-Jun-1971, 41 y.o.   MRN: [id number]  HPI Pt here to f/u medications. No seizure activity, taking keppra, he was suppose to titrate off dilantin but thought it was causing mouth soreness and foaming so he stopped it abruptly. Has been having lots of highs and lows in his mood, does not understand why people are worried about him staying clean. Has not been back to his therapist.  Viagra did not help for ED Tooth pain for past week.   Review of Systems - per above  GEN- denies fatigue, fever, weight loss,weakness, recent illness HEENT- denies eye drainage, change in vision, nasal discharge, CVS- denies chest pain, palpitations RESP- denies SOB, cough, wheeze ABD- denies N/V, change in stools, abd pain GU- denies dysuria, hematuria, dribbling, incontinence MSK- denies joint pain, muscle aches, injury Neuro- denies headache, dizziness, syncope, seizure activity      Objective:   Physical Exam GEN- NAD, alert and oriented x3 HEENT- PERRL, EOMI, non injected sclera, pink conjunctiva, MMM, oropharyx- inflammed lower gumline, poor dentition, cavities, erythema surrounding multiple teeth, no discrete abscess Neck- Supple, no LAD CVS- RRR, no murmur RESP-CTAB EXT- No edema Radial 2+ Psych- very labile mood, anxious appearing, then gets angry, no SI, no apparent hallucinations        Assessment & Plan:

## 2012-08-13 NOTE — Assessment & Plan Note (Signed)
Pt advised to go directly to [facility] today, he is with partner whp is willing to take him, he is aware of the walkin clinic. His medications need to be adjusted, he is high risk for relapse

## 2012-11-10 ENCOUNTER — Ambulatory Visit: Payer: Medicare Other | Admitting: Family Medicine

## 2013-02-01 ENCOUNTER — Emergency Department (HOSPITAL_COMMUNITY)
Admission: EM | Admit: 2013-02-01 | Discharge: 2013-02-01 | Disposition: A | Discharge status: Home / Self Care | Payer: Medicare Other | Attending: Emergency Medicine | Admitting: Emergency Medicine

## 2013-02-01 ENCOUNTER — Encounter (HOSPITAL_COMMUNITY): Payer: Self-pay

## 2013-02-01 DIAGNOSIS — Z79899 Other long term (current) drug therapy: Secondary | ICD-10-CM | POA: Insufficient documentation

## 2013-02-01 DIAGNOSIS — F319 Bipolar disorder, unspecified: Secondary | ICD-10-CM | POA: Insufficient documentation

## 2013-02-01 DIAGNOSIS — Z8659 Personal history of other mental and behavioral disorders: Secondary | ICD-10-CM | POA: Insufficient documentation

## 2013-02-01 DIAGNOSIS — F1110 Opioid abuse, uncomplicated: Secondary | ICD-10-CM | POA: Insufficient documentation

## 2013-02-01 DIAGNOSIS — F1990 Other psychoactive substance use, unspecified, uncomplicated: Secondary | ICD-10-CM

## 2013-02-01 DIAGNOSIS — G40909 Epilepsy, unspecified, not intractable, without status epilepticus: Secondary | ICD-10-CM | POA: Insufficient documentation

## 2013-02-01 DIAGNOSIS — F1510 Other stimulant abuse, uncomplicated: Secondary | ICD-10-CM | POA: Insufficient documentation

## 2013-02-01 LAB — BASIC METABOLIC PANEL
CO2: 27 mEq/L (ref 19–32)
Calcium: 9.6 mg/dL (ref 8.4–10.5)
Chloride: 101 mEq/L (ref 96–112)
Glucose, Bld: 93 mg/dL (ref 70–99)
Potassium: 3.9 mEq/L (ref 3.5–5.1)
Sodium: 139 mEq/L (ref 135–145)

## 2013-02-01 LAB — ETHANOL: Alcohol, Ethyl (B): <11 mg/dL (ref 0–11)

## 2013-02-01 LAB — CBC WITH DIFFERENTIAL/PLATELET
Basophils Absolute: 0 10*3/uL (ref 0.0–0.1)
Lymphocytes Relative: 18 % (ref 12–46)
Lymphs Abs: 1.4 10*3/uL (ref 0.7–4.0)
Neutro Abs: 5.8 10*3/uL (ref 1.7–7.7)
Platelets: 166 10*3/uL (ref 150–400)
RBC: 4.84 MIL/uL (ref 4.22–5.81)
RDW: 14.9 % (ref 11.5–15.5)
WBC: 7.7 10*3/uL (ref 4.0–10.5)

## 2013-02-01 LAB — RAPID URINE DRUG SCREEN, HOSP PERFORMED
Amphetamines: POSITIVE — AB
Benzodiazepines: NOT DETECTED
Cocaine: NOT DETECTED
Opiates: NOT DETECTED

## 2013-02-01 NOTE — ED Notes (Signed)
Pt states he wants help to get help to get off of iv drugs including roxicodone, and opana.  Pt denies SI/ HI
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
## 2013-11-30 ENCOUNTER — Encounter (HOSPITAL_COMMUNITY): Payer: Self-pay | Admitting: Emergency Medicine

## 2013-11-30 ENCOUNTER — Emergency Department (HOSPITAL_COMMUNITY)
Admission: EM | Admit: 2013-11-30 | Discharge: 2013-11-30 | Disposition: A | Discharge status: Home / Self Care | Payer: Medicare Other | Attending: Emergency Medicine | Admitting: Emergency Medicine

## 2013-11-30 DIAGNOSIS — Z9889 Other specified postprocedural states: Secondary | ICD-10-CM | POA: Insufficient documentation

## 2013-11-30 DIAGNOSIS — Z8659 Personal history of other mental and behavioral disorders: Secondary | ICD-10-CM | POA: Insufficient documentation

## 2013-11-30 DIAGNOSIS — Z79899 Other long term (current) drug therapy: Secondary | ICD-10-CM | POA: Insufficient documentation

## 2013-11-30 DIAGNOSIS — M545 Low back pain, unspecified: Secondary | ICD-10-CM | POA: Insufficient documentation

## 2013-11-30 DIAGNOSIS — F17200 Nicotine dependence, unspecified, uncomplicated: Secondary | ICD-10-CM | POA: Insufficient documentation

## 2013-11-30 DIAGNOSIS — M549 Dorsalgia, unspecified: Secondary | ICD-10-CM

## 2013-11-30 DIAGNOSIS — IMO0002 Reserved for concepts with insufficient information to code with codable children: Secondary | ICD-10-CM | POA: Insufficient documentation

## 2013-11-30 DIAGNOSIS — R5383 Other fatigue: Secondary | ICD-10-CM

## 2013-11-30 DIAGNOSIS — R000 Tachycardia, unspecified: Secondary | ICD-10-CM | POA: Insufficient documentation

## 2013-11-30 DIAGNOSIS — M6281 Muscle weakness (generalized): Secondary | ICD-10-CM | POA: Insufficient documentation

## 2013-11-30 DIAGNOSIS — R5381 Other malaise: Secondary | ICD-10-CM | POA: Insufficient documentation

## 2013-11-30 DIAGNOSIS — G40909 Epilepsy, unspecified, not intractable, without status epilepticus: Secondary | ICD-10-CM | POA: Insufficient documentation

## 2013-11-30 LAB — RAPID URINE DRUG SCREEN, HOSP PERFORMED
Amphetamines: NOT DETECTED
BENZODIAZEPINES: NOT DETECTED
Barbiturates: NOT DETECTED
COCAINE: NOT DETECTED
Opiates: NOT DETECTED
Tetrahydrocannabinol: NOT DETECTED

## 2013-11-30 LAB — URINALYSIS, ROUTINE W REFLEX MICROSCOPIC
Bilirubin Urine: NEGATIVE
Glucose, UA: NEGATIVE mg/dL
Hgb urine dipstick: NEGATIVE
KETONES UR: NEGATIVE mg/dL
LEUKOCYTES UA: NEGATIVE
NITRITE: NEGATIVE
PH: 6 (ref 5.0–8.0)
Protein, ur: NEGATIVE mg/dL
SPECIFIC GRAVITY, URINE: 1.015 (ref 1.005–1.030)
UROBILINOGEN UA: 0.2 mg/dL (ref 0.0–1.0)

## 2013-11-30 MED ORDER — ACETAMINOPHEN 325 MG PO TABS
650.0000 mg | ORAL_TABLET | Freq: Once | ORAL | Status: AC
  Administered 2013-11-30: 650 mg via ORAL
  Filled 2013-11-30: qty 2

## 2013-11-30 MED ORDER — FENTANYL CITRATE 0.05 MG/ML IJ SOLN
100.0000 ug | Freq: Once | INTRAMUSCULAR | Status: AC
  Administered 2013-11-30: 100 ug via INTRAMUSCULAR
  Filled 2013-11-30: qty 2

## 2013-11-30 MED ORDER — METAXALONE 800 MG PO TABS
800.0000 mg | ORAL_TABLET | Freq: Once | ORAL | Status: AC
  Administered 2013-11-30: 800 mg via ORAL
  Filled 2013-11-30: qty 1

## 2013-11-30 MED ORDER — PREDNISONE 10 MG PO TABS
10.0000 mg | ORAL_TABLET | Freq: Once | ORAL | Status: AC
  Administered 2013-11-30: 10 mg via ORAL
  Filled 2013-11-30: qty 1

## 2013-11-30 MED ORDER — OXYCODONE-ACETAMINOPHEN 5-325 MG PO TABS: 1.0000 | ORAL_TABLET | Freq: Four times a day (QID) | ORAL | Status: DC | PRN

## 2013-11-30 MED ORDER — PREDNISONE 20 MG PO TABS: 40.0000 mg | ORAL_TABLET | Freq: Every day | ORAL | Status: DC

## 2013-11-30 MED ORDER — CYCLOBENZAPRINE HCL 7.5 MG PO TABS: 7.5000 mg | ORAL_TABLET | Freq: Three times a day (TID) | ORAL | Status: DC | PRN

## 2013-11-30 NOTE — ED Notes (Signed)
Patient c/o lower back pain that radiates around both flanks and into lower abd. Patient denies any nausea, vomiting, or diarrhea. Patient denies any known injury. Patient reports decrease in urination flow.
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
## 2013-11-30 NOTE — ED Notes (Signed)
PT ADVISED SEVERAL TIMES THAT WE NEED A URINE SPECIMEN.

## 2013-11-30 NOTE — Discharge Instructions (Signed)
Back Pain, Adult Low back pain is very common. About 1 in 5 people have back pain.The cause of low back pain is rarely dangerous. The pain often gets better over time.About half of people with a sudden onset of back pain feel better in just 2 weeks. About 8 in 10 people feel better by 6 weeks.  CAUSES Some common causes of back pain include:  Strain of the muscles or ligaments supporting the spine.  Wear and tear (degeneration) of the spinal discs.  Arthritis.  Direct injury to the back. DIAGNOSIS Most of the time, the direct cause of low back pain is not known.However, back pain can be treated effectively even when the exact cause of the pain is unknown.Answering your caregiver's questions about your overall health and symptoms is one of the most accurate ways to make sure the cause of your pain is not dangerous. If your caregiver needs more information, he or she may order lab work or imaging tests (X-rays or MRIs).However, even if imaging tests show changes in your back, this usually does not require surgery. HOME CARE INSTRUCTIONS For many people, back pain returns.Since low back pain is rarely dangerous, it is often a condition that people can learn to manageon their own.   Remain active. It is stressful on the back to sit or stand in one place. Do not sit, drive, or stand in one place for more than 30 minutes at a time. Take short walks on level surfaces as soon as pain allows.Try to increase the length of time you walk each day.  Do not stay in bed.Resting more than 1 or 2 days can delay your recovery.  Do not avoid exercise or work.Your body is made to move.It is not dangerous to be active, even though your back may hurt.Your back will likely heal faster if you return to being active before your pain is gone.  Pay attention to your body when you bend and lift. Many people have less discomfortwhen lifting if they bend their knees, keep the load close to their bodies,and  avoid twisting. Often, the most comfortable positions are those that put less stress on your recovering back.  Find a comfortable position to sleep. Use a firm mattress and lie on your side with your knees slightly bent. If you lie on your back, put a pillow under your knees.  Only take over-the-counter or prescription medicines as directed by your caregiver. Over-the-counter medicines to reduce pain and inflammation are often the most helpful.Your caregiver may prescribe muscle relaxant drugs.These medicines help dull your pain so you can more quickly return to your normal activities and healthy exercise.  Put ice on the injured area.  Put ice in a plastic bag.  Place a towel between your skin and the bag.  Leave the ice on for 15-20 minutes, 03-04 times a day for the first 2 to 3 days. After that, ice and heat may be alternated to reduce pain and spasms.  Ask your caregiver about trying back exercises and gentle massage. This may be of some benefit.  Avoid feeling anxious or stressed.Stress increases muscle tension and can worsen back pain.It is important to recognize when you are anxious or stressed and learn ways to manage it.Exercise is a great option. SEEK MEDICAL CARE IF:  You have pain that is not relieved with rest or medicine.  You have pain that does not improve in 1 week.  You have new symptoms.  You are generally not feeling well. SEEK   IMMEDIATE MEDICAL CARE IF:   You have pain that radiates from your back into your legs.  You develop new bowel or bladder control problems.  You have unusual weakness or numbness in your arms or legs.  You develop nausea or vomiting.  You develop abdominal pain.  You feel faint. Document Released: 10/14/2005 Document Revised: 04/14/2012 Document Reviewed: 03/04/2011 [employer] Patient Information 2014 [employer].  

## 2014-01-19 ENCOUNTER — Encounter: Payer: Self-pay | Admitting: Family Medicine

## 2014-02-11 ENCOUNTER — Emergency Department (HOSPITAL_COMMUNITY): Payer: Medicare Other

## 2014-02-11 ENCOUNTER — Emergency Department (HOSPITAL_COMMUNITY)
Admission: EM | Admit: 2014-02-11 | Discharge: 2014-02-11 | Disposition: A | Discharge status: Home / Self Care | Payer: Medicare Other | Attending: Emergency Medicine | Admitting: Emergency Medicine

## 2014-02-11 ENCOUNTER — Encounter (HOSPITAL_COMMUNITY): Payer: Self-pay | Admitting: Emergency Medicine

## 2014-02-11 DIAGNOSIS — Z91199 Patient's noncompliance with other medical treatment and regimen due to unspecified reason: Secondary | ICD-10-CM | POA: Insufficient documentation

## 2014-02-11 DIAGNOSIS — Z9114 Patient's other noncompliance with medication regimen: Secondary | ICD-10-CM

## 2014-02-11 DIAGNOSIS — F319 Bipolar disorder, unspecified: Secondary | ICD-10-CM | POA: Insufficient documentation

## 2014-02-11 DIAGNOSIS — F17200 Nicotine dependence, unspecified, uncomplicated: Secondary | ICD-10-CM | POA: Insufficient documentation

## 2014-02-11 DIAGNOSIS — Z7982 Long term (current) use of aspirin: Secondary | ICD-10-CM | POA: Insufficient documentation

## 2014-02-11 DIAGNOSIS — Z91148 Patient's other noncompliance with medication regimen for other reason: Secondary | ICD-10-CM

## 2014-02-11 DIAGNOSIS — Z9119 Patient's noncompliance with other medical treatment and regimen: Secondary | ICD-10-CM | POA: Insufficient documentation

## 2014-02-11 DIAGNOSIS — G40909 Epilepsy, unspecified, not intractable, without status epilepticus: Secondary | ICD-10-CM

## 2014-02-11 HISTORY — DX: Patient's other noncompliance with medication regimen: Z91.14

## 2014-02-11 HISTORY — DX: Patient's other noncompliance with medication regimen for other reason: Z91.148

## 2014-02-11 LAB — RAPID URINE DRUG SCREEN, HOSP PERFORMED
Amphetamines: NOT DETECTED
BARBITURATES: NOT DETECTED
Benzodiazepines: NOT DETECTED
Cocaine: NOT DETECTED
Opiates: NOT DETECTED
TETRAHYDROCANNABINOL: NOT DETECTED

## 2014-02-11 LAB — BASIC METABOLIC PANEL
BUN: 11 mg/dL (ref 6–23)
CHLORIDE: 104 meq/L (ref 96–112)
CO2: 23 meq/L (ref 19–32)
CREATININE: 0.94 mg/dL (ref 0.50–1.35)
Calcium: 9.5 mg/dL (ref 8.4–10.5)
GFR calc Af Amer: >90 mL/min (ref >90)
GFR calc non Af Amer: >90 mL/min (ref >90)
GLUCOSE: 193 mg/dL — AB (ref 70–99)
Potassium: 3.8 mEq/L (ref 3.7–5.3)
Sodium: 141 mEq/L (ref 137–147)

## 2014-02-11 LAB — CBC WITH DIFFERENTIAL/PLATELET
Basophils Absolute: 0 10*3/uL (ref 0.0–0.1)
Basophils Relative: 0 % (ref 0–1)
EOS PCT: 1 % (ref 0–5)
Eosinophils Absolute: 0.1 10*3/uL (ref 0.0–0.7)
HCT: 40 % (ref 39.0–52.0)
HEMOGLOBIN: 13.1 g/dL (ref 13.0–17.0)
LYMPHS ABS: 0.7 10*3/uL (ref 0.7–4.0)
Lymphocytes Relative: 8 % — ABNORMAL LOW (ref 12–46)
MCH: 27.6 pg (ref 26.0–34.0)
MCHC: 32.8 g/dL (ref 30.0–36.0)
MCV: 84.4 fL (ref 78.0–100.0)
MONO ABS: 0.3 10*3/uL (ref 0.1–1.0)
MONOS PCT: 4 % (ref 3–12)
NEUTROS ABS: 7.8 10*3/uL — AB (ref 1.7–7.7)
Neutrophils Relative %: 87 % — ABNORMAL HIGH (ref 43–77)
Platelets: 175 10*3/uL (ref 150–400)
RBC: 4.74 MIL/uL (ref 4.22–5.81)
RDW: 15.2 % (ref 11.5–15.5)
WBC: 8.9 10*3/uL (ref 4.0–10.5)

## 2014-02-11 LAB — ETHANOL: Alcohol, Ethyl (B): <11 mg/dL (ref 0–11)

## 2014-02-11 LAB — LITHIUM LEVEL: LITHIUM LVL: 0.72 meq/L — AB (ref 0.80–1.40)

## 2014-02-11 MED ORDER — ACETAMINOPHEN 325 MG PO TABS
650.0000 mg | ORAL_TABLET | Freq: Once | ORAL | Status: AC
  Administered 2014-02-11: 650 mg via ORAL
  Filled 2014-02-11: qty 2
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
## 2014-02-11 NOTE — ED Notes (Signed)
nad noted prior to dc. Dc instructions reviewed and voiced understanding. Pt instructed to f/u with pcp and voiced understanding.

## 2014-02-11 NOTE — ED Notes (Signed)
Seizure x 2 this am. Pt took self off Depakote bc having suicidal thoughts why on medication. Pt called EMS after 2nd seizure.

## 2014-02-11 NOTE — Discharge Instructions (Signed)
°Emergency Department Resource Guide °1) Find a Doctor and Pay Out of Pocket °Although you won't have to find out who is covered by your insurance plan, it is a good idea to ask around and get recommendations. You will then need to call the office and see if the doctor you have chosen will accept you as a new patient and what types of options they offer for patients who are self-pay. Some doctors offer discounts or will set up payment plans for their patients who do not have insurance, but you will need to ask so you aren't surprised when you get to your appointment. ° °2) Contact Your Local Health Department °Not all health departments have doctors that can see patients for sick visits, but many do, so it is worth a call to see if yours does. If you don't know where your local health department is, you can check in your phone book. The CDC also has a tool to help you locate your state's health department, and many state websites also have listings of all of their local health departments. ° °3) Find a Walk-in Clinic °If your illness is not likely to be very severe or complicated, you may want to try a walk in clinic. These are popping up all over the country in pharmacies, drugstores, and shopping centers. They're usually staffed by nurse practitioners or physician assistants that have been trained to treat common illnesses and complaints. They're usually fairly quick and inexpensive. However, if you have serious medical issues or chronic medical problems, these are probably not your best option. ° °No Primary Care Doctor: °- Call [facility] at  832-8000 - they can help you locate a primary care doctor that  accepts your insurance, provides certain services, etc. °- Physician Referral Service- 1-800-533-3463 ° °Chronic Pain Problems: °Organization         Address  Phone   Notes  °[facility]  (336) 297-2271 Patients need to be referred by their primary care doctor.  ° °Medication  Assistance: °Organization         Address  Phone   Notes  °[place] [facility] [address] °[place], [place] [address] (336) 641-8030 --Must be a resident of [place] °-- Must have NO insurance coverage whatsoever (no Medicaid/ Medicare, etc.) °-- The pt. MUST have a primary care doctor that directs their care regularly and follows them in the community °  °[facility]  (866) 331-1348   °[employer]  (888) 892-1162   ° °Agencies that provide inexpensive medical care: °Organization         Address  Phone   Notes  °[facility]  (336) 832-8035   °[facility]    (336) 832-7272   °[facility] [address] °[place], [place] [address] (336) 832-4777   °[facility] [address], °[place] (336) 271-4999   °[facility]    (336) 373-0678   °[facility]    (336) 272-1050   °[facility] ° [address], [place] Phone:  (336) 832-4444, Fax:  (336) 832-4440 Hours of Operation:  9 am - 6 pm, M-F.  Also accepts Medicaid/Medicare and self-pay.  °[facility] ° [address], [place] Phone: (336) 832-3150, Fax: (336) 832-3151. Hours of Operation:  8:30 am - 5:30 pm, M-F.  Also accepts Medicaid and self-pay.  °[facility] [place] [address], [place] Phone: (336) 878-6027   °[facility] [address], [place], [place] (336)723-1848, Ext. 123 Mondays & Thursdays: 7-9 AM.  First 15 patients are seen on a first come, first serve basis. °  ° °Medicaid-accepting [place] Providers: ° °Organization         Address  Phone   Notes  °[facility] [address], [place] (336) 641-2100 Also accepts self-pay patients.  °[facility] [address], [place] ° (336) 856-9996   °[facility] [address], [place]  (336) 288-8857   °[facility] [address] [place] [address], [place] (336) 299-7000   °Veita Bland [address], [place]  ° (336) 373-1557 Only accepts [place] Access Medicaid patients after they have their name applied to their card.  ° °Self-Pay (no insurance) in [place]: ° °Organization         Address  Phone   Notes  °Sickle Cell Patients, [facility] [address], [place] (336) 832-1970   °[facility] [address], [place] (336) 832-4400   °[facility] ° [address], [place] (336) 992-4800   °[facility]/Dr. Osei-Bonsu ° [address] [place] [address], [place] or [address], [place] (336) 841-8500 Phone number for both [place] and [place] locations is the same.  °[facility] [address], [place] (336) 299-0000   °[facility] [address] [place] [address], [place] or [address] (336) 852-7530 °(336) 878-2260   °[facility] [address],  (336) 350-1642, phone; (336) 294-5005, fax Sees patients 1st and 3rd Saturday of every month.  Must not qualify for public or private insurance (i.e. Medicaid, Medicare, [place] Health Choice, Veterans' Benefits) • Household income should be no more than 200% of the poverty level •The clinic cannot treat you if you are pregnant or think you are pregnant • Sexually transmitted diseases are not treated at the clinic.  ° ° °Dental Care: °Organization         Address  Phone  Notes  °[place] [facility] [address],  (336) 641-6152 Accepts children up to age 21 who are enrolled in Medicaid or [place] Health Choice; pregnant women with a Medicaid card; and children who have applied for Medicaid or [place] Health Choice, but were declined, whose parents can pay a reduced fee at time of service.  °[place]  [facility] [place]  [address], [place] (336) 641-7733 Accepts children up to age 21 who are enrolled in Medicaid or [place] Health Choice; pregnant women with a Medicaid card; and children who have applied for Medicaid or [place] Health Choice, but were declined, whose parents can pay a reduced fee at time of service.  °[facility] ° [address],  (336) 641-4533 Patients are seen by appointment only. Walk-ins are not accepted. [facility] will see patients 18 years of age and older. °Monday - Tuesday (8am-5pm) °Most Wednesdays (8:30-5pm) °$30 per visit, cash only  °[facility] ° [address], [place] (336) 641-4533 Patients are seen by appointment only. Walk-ins are not accepted. [facility] will see patients 18 years of age and older. °One   Wednesday Evening (Monthly: Volunteer Based).  $30 per visit, cash only  °[facility]  (919) 537-3737 for adults; Children under age 4, call Graduate Pediatric Dentistry at ([phone]) [phone]. Children aged 4-14, please call (919) [phone] to request a pediatric application. ° Dental services are provided in all areas of dental care including fillings, crowns and bridges, complete and partial dentures, implants, gum treatment, root canals, and extractions. Preventive care is also provided. Treatment is provided to both adults and children. °Patients are selected via a lottery and there is often a waiting list. °  °[facility] [address], °[place] ° (336) 763-8833 [email or web address] °  °[facility] [address], [place], [place] (336)723-1848, Ext. 123 Second and Fourth Thursday of each month, opens at 6:30 AM; Clinic ends at 9 AM.  Patients are seen on a first-come first-served basis, and a limited number are seen during each clinic.  ° °[facility] ° [address], [place], [place] (336) 723-7904    Eligibility Requirements °You must have lived in [place], [place], or [place] counties for at least the last three months. °  You cannot be eligible for state or federal sponsored healthcare insurance, including Veterans Administration, Medicaid, or Medicare. °  You generally cannot be eligible for healthcare insurance through your employer.  °  How to apply: °Eligibility screenings are held every Tuesday and Wednesday afternoon from 1:00 pm until 4:00 pm. You do not need an appointment for the interview!  °[facility] [address], [place], [place] 336-631-2330   °[facility]  336-342-8273   °[facility]  336-703-3100   °[facility]  336-570-6415   ° °Behavioral Health Resources in the Community: °Intensive Outpatient Programs °Organization         Address  Phone  Notes  °[facility] [address], [place], [place] 336-878-6098   °[facility] [address], [place], [place] 336-832-9800   °[facility] [address], [place], [place] ° 336-882-2125   °[facility] [address],  °[place], [place] 1-800-853-5163 or 336-641-4981   °Substance Abuse Resources °Organization         Address  Phone  Notes  °[facility]  336-882-2125   °[facility]  336-784-9470   °[facility]  336-285-9073   °[facility]  336-845-3988   °Residential & Outpatient Substance Abuse Program  1-800-659-3381   °Psychological Services °Organization         Address  Phone  Notes  °[facility]  336- 832-9600   °[facility]  336- 378-7881   °[facility] [address], [place] 1-800-853-5163 or 336-641-4981   ° °Mobile Crisis Teams °Organization         Address  Phone  Notes  °[facility], Mobile Crisis Care Unit  1-877-626-1772   °Assertive °[facility] ° [address]  [place], [place] 336-834-9664   °Sharon DeEsch [address] °[place]  336-554-5454   ° °Self-Help/Support Groups °Organization         Address  Phone             Notes  °[facility] - variety of support groups  336- 373-1402 Call for more information  °Narcotics Anonymous (NA), [facility] [address], °[place]   2 meetings at this location  ° °  Residential Treatment Programs Organization         Address  Phone  Notes  [facility] [address],    [place]  1-720-599-9990   [facility]  [address], [place] [address], [place], [place]   [place], [place] [phone] Admissions: 8am-3pm M-F  [facility] [address]    [place], [place] 947-096-2836   [facility] [address] [place], [place], [place]   [facility] [address],  [place], [place]   [facility] Intensive Outpatient [address], [place], [place]   [facility] [address]  [place], [place] 1-856 214 2728 or 4310231601   [facility] [address] [place], [place] Accepts Medicaid  [facility] [address]  [place] 1-(971)078-7569 Substance Abuse/Addiction Treatment   [facility]         Address  Phone  Notes  [facility]  3234839420   Domenic Schwab, PhD [address] Arlis Porta [place], [place]   872-579-4173 or (667) 723-5698   [facility] [place], [place] (763) 368-1240   [facility] [address], [place], [place] [phone] Insurance/Medicaid/sponsorship through [facility] [address] [place]                                    [place], [place] (321)427-2332 [facility] [address], [place] [phone]    Dr. Adele Schilder  [phone]   [facility] Dept. 1) [address], [place] 2) [employer] 3)  [address], [place] [phone] [phone]) [phone]  [phone]   [place] [phone] or [phone] (After Hours)       Take your usual prescriptions as previously directed.  Call your regular medical doctor and your Neurologist today to schedule a follow up appointment within the next week.  Return to the Emergency Department immediately sooner if worsening.

## 2014-02-21 ENCOUNTER — Inpatient Hospital Stay: Payer: Self-pay | Admitting: Family Medicine

## 2014-04-10 ENCOUNTER — Emergency Department (HOSPITAL_COMMUNITY)
Admission: EM | Admit: 2014-04-10 | Discharge: 2014-04-10 | Disposition: A | Discharge status: Home / Self Care | Payer: Medicare Other | Attending: Emergency Medicine | Admitting: Emergency Medicine

## 2014-04-10 ENCOUNTER — Encounter (HOSPITAL_COMMUNITY): Payer: Self-pay | Admitting: Emergency Medicine

## 2014-04-10 DIAGNOSIS — R569 Unspecified convulsions: Secondary | ICD-10-CM

## 2014-04-10 DIAGNOSIS — Z79899 Other long term (current) drug therapy: Secondary | ICD-10-CM | POA: Insufficient documentation

## 2014-04-10 DIAGNOSIS — Z7982 Long term (current) use of aspirin: Secondary | ICD-10-CM | POA: Insufficient documentation

## 2014-04-10 DIAGNOSIS — F319 Bipolar disorder, unspecified: Secondary | ICD-10-CM | POA: Insufficient documentation

## 2014-04-10 DIAGNOSIS — F17200 Nicotine dependence, unspecified, uncomplicated: Secondary | ICD-10-CM | POA: Insufficient documentation

## 2014-04-10 DIAGNOSIS — F411 Generalized anxiety disorder: Secondary | ICD-10-CM | POA: Insufficient documentation

## 2014-04-10 DIAGNOSIS — G40909 Epilepsy, unspecified, not intractable, without status epilepticus: Secondary | ICD-10-CM | POA: Insufficient documentation

## 2014-04-10 LAB — BASIC METABOLIC PANEL
BUN: 18 mg/dL (ref 6–23)
CALCIUM: 8.7 mg/dL (ref 8.4–10.5)
CO2: 25 meq/L (ref 19–32)
Chloride: 104 mEq/L (ref 96–112)
Creatinine, Ser: 0.9 mg/dL (ref 0.50–1.35)
GFR calc Af Amer: >90 mL/min (ref >90)
Glucose, Bld: 136 mg/dL — ABNORMAL HIGH (ref 70–99)
Potassium: 3.8 mEq/L (ref 3.7–5.3)
Sodium: 141 mEq/L (ref 137–147)

## 2014-04-10 LAB — ETHANOL

## 2014-04-10 MED ORDER — SODIUM CHLORIDE 0.9 % IV BOLUS (SEPSIS)
1000.0000 mL | Freq: Once | INTRAVENOUS | Status: AC
  Administered 2014-04-10: 1000 mL via INTRAVENOUS

## 2014-04-10 NOTE — ED Notes (Signed)
Patient arrives via EMS from home with c/o seizures today. Patient reports not taking seizure medications due to "they cause me to have suicidal thoughts. Dr Merlene Laughter knows about it". States he has not taken seizure medications in "years". Alert/oriented x 4. No distress. Patient was drinking water upon arrival to ED.

## 2014-04-10 NOTE — Discharge Instructions (Signed)
Followup with your neurologist in the next week. No driving until cleared by your neurologist.  Return to the emergency department if you develop any new or concerning symptoms.   Driving and Equipment Restrictions Some medical problems make it dangerous to drive, ride a bike, or use machines. Some of these problems are:  A hard blow to the head (concussion).  Passing out (fainting).  Twitching and shaking (seizures).  Low blood sugar.  Taking medicine to help you relax (sedatives).  Taking pain medicines.  Wearing an eye patch.  Wearing splints. This can make it hard to use parts of your body that you need to drive safely. HOME CARE   Do not drive until your doctor says it is okay.  Do not use machines until your doctor says it is okay. You may need a form signed by your doctor (medical release) before you can drive again. You may also need this form before you do other tasks where you need to be fully alert. MAKE SURE YOU:  Understand these instructions.  Will watch your condition.  Will get help right away if you are not doing well or get worse. Document Released: 11/21/2004 Document Revised: 01/06/2012 Document Reviewed: 02/21/2010 [facility] Patient Information 2014 [employer].  Seizure, Adult A seizure is abnormal electrical activity in the brain. Seizures usually last from 30 seconds to 2 minutes. There are various types of seizures. Before a seizure, you may have a warning sensation (aura) that a seizure is about to occur. An aura may include the following symptoms:   Fear or anxiety.  Nausea.  Feeling like the room is spinning (vertigo).  Vision changes, such as seeing flashing lights or spots. Common symptoms during a seizure include:  A change in attention or behavior (altered mental status).  Convulsions with rhythmic jerking movements.  Drooling.  Rapid eye movements.  Grunting.  Loss of bladder and bowel control.  Bitter taste in the  mouth.  Tongue biting. After a seizure, you may feel confused and sleepy. You may also have an injury resulting from convulsions during the seizure. HOME CARE INSTRUCTIONS   If you are given medicines, take them exactly as prescribed by your health care provider.  Keep all follow-up appointments as directed by your health care provider.  Do not swim or drive or engage in risky activity during which a seizure could cause further injury to you or others until your health care provider says it is OK.  Get adequate rest.  Teach friends and family what to do if you have a seizure. They should:  Lay you on the ground to prevent a fall.  Put a cushion under your head.  Loosen any tight clothing around your neck.  Turn you on your side. If vomiting occurs, this helps keep your airway clear.  Stay with you until you recover.  Know whether or not you need emergency care. SEEK IMMEDIATE MEDICAL CARE IF:  The seizure lasts longer than 5 minutes.  The seizure is severe or you do not wake up immediately after the seizure.  You have an altered mental status after the seizure.  You are having more frequent or worsening seizures. Someone should drive you to the emergency department or call local emergency services (911 in [place] MAKE SURE YOU:  Understand these instructions.  Will watch your condition.  Will get help right away if you are not doing well or get worse. Document Released: 10/11/2000 Document Revised: 08/04/2013 Document Reviewed: 05/26/2013 [employer] Patient Information 2014  [employer]. ° °

## 2014-04-10 NOTE — ED Notes (Signed)
EMS reports patient was post-ictal on arrival to scene, that he was unaware of seizure and confused. Patient now oriented.
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
## 2014-05-05 ENCOUNTER — Emergency Department (HOSPITAL_COMMUNITY)
Admission: EM | Admit: 2014-05-05 | Discharge: 2014-05-05 | Disposition: A | Discharge status: Home / Self Care | Payer: Medicare Other | Attending: Emergency Medicine | Admitting: Emergency Medicine

## 2014-05-05 ENCOUNTER — Encounter (HOSPITAL_COMMUNITY): Payer: Self-pay | Admitting: Emergency Medicine

## 2014-05-05 ENCOUNTER — Emergency Department (HOSPITAL_COMMUNITY): Payer: Medicare Other

## 2014-05-05 DIAGNOSIS — K529 Noninfective gastroenteritis and colitis, unspecified: Secondary | ICD-10-CM

## 2014-05-05 DIAGNOSIS — Z8669 Personal history of other diseases of the nervous system and sense organs: Secondary | ICD-10-CM | POA: Insufficient documentation

## 2014-05-05 DIAGNOSIS — F17200 Nicotine dependence, unspecified, uncomplicated: Secondary | ICD-10-CM | POA: Insufficient documentation

## 2014-05-05 DIAGNOSIS — F411 Generalized anxiety disorder: Secondary | ICD-10-CM | POA: Insufficient documentation

## 2014-05-05 DIAGNOSIS — K5289 Other specified noninfective gastroenteritis and colitis: Secondary | ICD-10-CM | POA: Insufficient documentation

## 2014-05-05 DIAGNOSIS — Z79899 Other long term (current) drug therapy: Secondary | ICD-10-CM | POA: Insufficient documentation

## 2014-05-05 DIAGNOSIS — Z792 Long term (current) use of antibiotics: Secondary | ICD-10-CM | POA: Insufficient documentation

## 2014-05-05 DIAGNOSIS — F319 Bipolar disorder, unspecified: Secondary | ICD-10-CM | POA: Insufficient documentation

## 2014-05-05 LAB — COMPREHENSIVE METABOLIC PANEL
ALT: 35 U/L (ref 0–53)
AST: 21 U/L (ref 0–37)
Albumin: 4.2 g/dL (ref 3.5–5.2)
Alkaline Phosphatase: 95 U/L (ref 39–117)
Anion gap: 13 (ref 5–15)
BUN: 21 mg/dL (ref 6–23)
CALCIUM: 9.8 mg/dL (ref 8.4–10.5)
CO2: 26 mEq/L (ref 19–32)
CREATININE: 1.05 mg/dL (ref 0.50–1.35)
Chloride: 99 mEq/L (ref 96–112)
GFR calc Af Amer: >90 mL/min (ref >90)
GFR calc non Af Amer: 85 mL/min — ABNORMAL LOW (ref >90)
Glucose, Bld: 111 mg/dL — ABNORMAL HIGH (ref 70–99)
Potassium: 4.1 mEq/L (ref 3.7–5.3)
Sodium: 138 mEq/L (ref 137–147)
Total Bilirubin: 0.4 mg/dL (ref 0.3–1.2)
Total Protein: 8.1 g/dL (ref 6.0–8.3)

## 2014-05-05 LAB — CBC WITH DIFFERENTIAL/PLATELET
BASOS ABS: 0.1 10*3/uL (ref 0.0–0.1)
BASOS PCT: 1 % (ref 0–1)
EOS PCT: 1 % (ref 0–5)
Eosinophils Absolute: 0.1 10*3/uL (ref 0.0–0.7)
HCT: 44.3 % (ref 39.0–52.0)
Hemoglobin: 14.7 g/dL (ref 13.0–17.0)
Lymphocytes Relative: 36 % (ref 12–46)
Lymphs Abs: 2.7 10*3/uL (ref 0.7–4.0)
MCH: 27.3 pg (ref 26.0–34.0)
MCHC: 33.2 g/dL (ref 30.0–36.0)
MCV: 82.2 fL (ref 78.0–100.0)
Monocytes Absolute: 0.7 10*3/uL (ref 0.1–1.0)
Monocytes Relative: 9 % (ref 3–12)
NEUTROS ABS: 4 10*3/uL (ref 1.7–7.7)
Neutrophils Relative %: 53 % (ref 43–77)
Platelets: 247 10*3/uL (ref 150–400)
RBC: 5.39 MIL/uL (ref 4.22–5.81)
RDW: 14.3 % (ref 11.5–15.5)
WBC: 7.5 10*3/uL (ref 4.0–10.5)

## 2014-05-05 MED ORDER — CIPROFLOXACIN IN D5W 400 MG/200ML IV SOLN
400.0000 mg | Freq: Once | INTRAVENOUS | Status: AC
  Administered 2014-05-05: 400 mg via INTRAVENOUS
  Filled 2014-05-05: qty 200

## 2014-05-05 MED ORDER — DICYCLOMINE HCL 20 MG PO TABS: ORAL_TABLET | ORAL | Status: DC

## 2014-05-05 MED ORDER — ONDANSETRON HCL 4 MG/2ML IJ SOLN
4.0000 mg | Freq: Once | INTRAMUSCULAR | Status: AC
  Administered 2014-05-05: 4 mg via INTRAVENOUS
  Filled 2014-05-05: qty 2

## 2014-05-05 MED ORDER — SODIUM CHLORIDE 0.9 % IV BOLUS (SEPSIS)
1000.0000 mL | Freq: Once | INTRAVENOUS | Status: AC
  Administered 2014-05-05: 1000 mL via INTRAVENOUS

## 2014-05-05 MED ORDER — CIPROFLOXACIN HCL 500 MG PO TABS: ORAL_TABLET | ORAL | Status: DC

## 2014-05-05 MED ORDER — METRONIDAZOLE IN NACL 5-0.79 MG/ML-% IV SOLN
500.0000 mg | Freq: Once | INTRAVENOUS | Status: AC
  Administered 2014-05-05: 500 mg via INTRAVENOUS
  Filled 2014-05-05: qty 100

## 2014-05-05 MED ORDER — METRONIDAZOLE 500 MG PO TABS: 500.0000 mg | ORAL_TABLET | Freq: Four times a day (QID) | ORAL | Status: DC

## 2014-05-05 MED ORDER — PROMETHAZINE HCL 25 MG PO TABS: 25.0000 mg | ORAL_TABLET | Freq: Four times a day (QID) | ORAL | Status: DC | PRN

## 2014-05-05 NOTE — Discharge Instructions (Signed)
Follow up with your md next week. °
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
## 2014-05-05 NOTE — ED Notes (Signed)
Pt co diarrhea x 2 weeks, N/V, pt has HX of kidney problems and seizures, most recent 04/30/14.

## 2014-05-05 NOTE — ED Notes (Signed)
MD at bedside. 

## 2014-05-29 ENCOUNTER — Emergency Department (HOSPITAL_COMMUNITY)
Admission: EM | Admit: 2014-05-29 | Discharge: 2014-05-29 | Disposition: A | Discharge status: Home / Self Care | Payer: Medicare Other | Attending: Emergency Medicine | Admitting: Emergency Medicine

## 2014-05-29 ENCOUNTER — Emergency Department (HOSPITAL_COMMUNITY): Payer: Medicare Other

## 2014-05-29 ENCOUNTER — Encounter (HOSPITAL_COMMUNITY): Payer: Self-pay | Admitting: Emergency Medicine

## 2014-05-29 DIAGNOSIS — R109 Unspecified abdominal pain: Secondary | ICD-10-CM | POA: Diagnosis not present

## 2014-05-29 DIAGNOSIS — R1031 Right lower quadrant pain: Secondary | ICD-10-CM | POA: Insufficient documentation

## 2014-05-29 DIAGNOSIS — Z8719 Personal history of other diseases of the digestive system: Secondary | ICD-10-CM | POA: Insufficient documentation

## 2014-05-29 DIAGNOSIS — Z9119 Patient's noncompliance with other medical treatment and regimen: Secondary | ICD-10-CM | POA: Insufficient documentation

## 2014-05-29 DIAGNOSIS — R110 Nausea: Secondary | ICD-10-CM | POA: Insufficient documentation

## 2014-05-29 DIAGNOSIS — R5381 Other malaise: Secondary | ICD-10-CM | POA: Insufficient documentation

## 2014-05-29 DIAGNOSIS — R197 Diarrhea, unspecified: Secondary | ICD-10-CM | POA: Insufficient documentation

## 2014-05-29 DIAGNOSIS — R300 Dysuria: Secondary | ICD-10-CM | POA: Diagnosis not present

## 2014-05-29 DIAGNOSIS — Z91199 Patient's noncompliance with other medical treatment and regimen due to unspecified reason: Secondary | ICD-10-CM | POA: Insufficient documentation

## 2014-05-29 DIAGNOSIS — Z8659 Personal history of other mental and behavioral disorders: Secondary | ICD-10-CM | POA: Diagnosis not present

## 2014-05-29 DIAGNOSIS — F17200 Nicotine dependence, unspecified, uncomplicated: Secondary | ICD-10-CM | POA: Insufficient documentation

## 2014-05-29 DIAGNOSIS — R5383 Other fatigue: Secondary | ICD-10-CM | POA: Diagnosis not present

## 2014-05-29 HISTORY — DX: Noninfective gastroenteritis and colitis, unspecified: K52.9

## 2014-05-29 LAB — URINALYSIS, ROUTINE W REFLEX MICROSCOPIC
Bilirubin Urine: NEGATIVE
Glucose, UA: NEGATIVE mg/dL
Hgb urine dipstick: NEGATIVE
Ketones, ur: NEGATIVE mg/dL
Leukocytes, UA: NEGATIVE
NITRITE: NEGATIVE
Protein, ur: NEGATIVE mg/dL
SPECIFIC GRAVITY, URINE: 1.01 (ref 1.005–1.030)
UROBILINOGEN UA: 0.2 mg/dL (ref 0.0–1.0)
pH: 6.5 (ref 5.0–8.0)

## 2014-05-29 LAB — CBC WITH DIFFERENTIAL/PLATELET
BASOS PCT: 1 % (ref 0–1)
Basophils Absolute: 0 10*3/uL (ref 0.0–0.1)
EOS PCT: 2 % (ref 0–5)
Eosinophils Absolute: 0.1 10*3/uL (ref 0.0–0.7)
HEMATOCRIT: 42.1 % (ref 39.0–52.0)
Hemoglobin: 13.9 g/dL (ref 13.0–17.0)
Lymphocytes Relative: 38 % (ref 12–46)
Lymphs Abs: 1.8 10*3/uL (ref 0.7–4.0)
MCH: 27.2 pg (ref 26.0–34.0)
MCHC: 33 g/dL (ref 30.0–36.0)
MCV: 82.4 fL (ref 78.0–100.0)
MONO ABS: 0.4 10*3/uL (ref 0.1–1.0)
Monocytes Relative: 8 % (ref 3–12)
NEUTROS ABS: 2.4 10*3/uL (ref 1.7–7.7)
Neutrophils Relative %: 51 % (ref 43–77)
Platelets: 165 10*3/uL (ref 150–400)
RBC: 5.11 MIL/uL (ref 4.22–5.81)
RDW: 14.3 % (ref 11.5–15.5)
WBC: 4.7 10*3/uL (ref 4.0–10.5)

## 2014-05-29 LAB — COMPREHENSIVE METABOLIC PANEL
ALBUMIN: 4.3 g/dL (ref 3.5–5.2)
ALT: 86 U/L — ABNORMAL HIGH (ref 0–53)
AST: 47 U/L — AB (ref 0–37)
Alkaline Phosphatase: 77 U/L (ref 39–117)
Anion gap: 11 (ref 5–15)
BUN: 15 mg/dL (ref 6–23)
CALCIUM: 9.8 mg/dL (ref 8.4–10.5)
CHLORIDE: 102 meq/L (ref 96–112)
CO2: 27 mEq/L (ref 19–32)
CREATININE: 0.91 mg/dL (ref 0.50–1.35)
GFR calc Af Amer: >90 mL/min (ref >90)
GFR calc non Af Amer: >90 mL/min (ref >90)
Glucose, Bld: 93 mg/dL (ref 70–99)
Potassium: 4 mEq/L (ref 3.7–5.3)
Sodium: 140 mEq/L (ref 137–147)
Total Bilirubin: 0.5 mg/dL (ref 0.3–1.2)
Total Protein: 7.8 g/dL (ref 6.0–8.3)

## 2014-05-29 LAB — CLOSTRIDIUM DIFFICILE BY PCR: CDIFFPCR: POSITIVE — AB

## 2014-05-29 LAB — LIPASE, BLOOD: LIPASE: 50 U/L (ref 11–59)

## 2014-05-29 LAB — HIV ANTIBODY (ROUTINE TESTING W REFLEX): HIV: NONREACTIVE

## 2014-05-29 MED ORDER — IOHEXOL 300 MG/ML  SOLN
100.0000 mL | Freq: Once | INTRAMUSCULAR | Status: AC | PRN
  Administered 2014-05-29: 100 mL via INTRAVENOUS

## 2014-05-29 MED ORDER — METRONIDAZOLE 500 MG PO TABS: ORAL_TABLET | ORAL | Status: DC

## 2014-05-29 MED ORDER — HYDROCODONE-ACETAMINOPHEN 5-325 MG PO TABS: ORAL_TABLET | ORAL | Status: DC

## 2014-05-29 MED ORDER — IOHEXOL 300 MG/ML  SOLN
50.0000 mL | Freq: Once | INTRAMUSCULAR | Status: AC | PRN
  Administered 2014-05-29: 50 mL via ORAL

## 2014-05-29 MED ORDER — SODIUM CHLORIDE 0.9 % IV SOLN
Freq: Once | INTRAVENOUS | Status: AC
  Administered 2014-05-29: 12:00:00 via INTRAVENOUS

## 2014-05-29 MED ORDER — MORPHINE SULFATE 4 MG/ML IJ SOLN
4.0000 mg | Freq: Once | INTRAMUSCULAR | Status: AC
  Administered 2014-05-29: 4 mg via INTRAVENOUS
  Filled 2014-05-29: qty 1

## 2014-05-29 MED ORDER — ONDANSETRON HCL 4 MG/2ML IJ SOLN
4.0000 mg | Freq: Once | INTRAMUSCULAR | Status: AC
  Administered 2014-05-29: 4 mg via INTRAVENOUS
  Filled 2014-05-29: qty 2

## 2014-05-29 NOTE — ED Notes (Signed)
Pt drinking contrast, tolerating well.

## 2014-05-29 NOTE — ED Notes (Addendum)
Pt able to provide stool sample prior to discharge. PA consulted concerning waiting for results or if pt to be called at d/c.

## 2014-05-29 NOTE — ED Notes (Signed)
PA aware of results and notified EDP. Awaiting further orders.

## 2014-05-29 NOTE — ED Notes (Signed)
PA reported that px e-signed to [employer]. PA notified pt.

## 2014-05-29 NOTE — ED Notes (Signed)
PA reported results will come back in about a day. Lab was called concerning pt finding out results. Lab reported MD will call pt with results. Pt confirmed understanding it will take about a day for results to come back and MD will call him with results.

## 2014-05-29 NOTE — Discharge Instructions (Signed)

## 2014-05-29 NOTE — ED Notes (Signed)
PA at bedside.

## 2014-05-29 NOTE — ED Notes (Signed)
CRITICAL VALUE ALERT  Critical value received:  C.Diff positive  Date of notification:  05/29/14  Time of notification:  3374  Critical value read back:Yes.    Nurse who received alert:  Parthenia Ames  MD notified (1st page):  Tammy Triplett,PA  Time of first page:  4514  MD notified (2nd page): Tammy Triplett,PA  Time of second page:  Responding MD:  Delfina Redwood  Time MD responded:  [phone]

## 2014-05-29 NOTE — ED Notes (Signed)
Pt states that he was seen in er on 05/05/2014, diagnosed with colitis, given numerous antibiotics, states that he was better for about a week, then started having diarrhea again, presents to er today for help with the diarrhea, feeling warm all over his body, requesting HIV test, pain behind right shoulder blade for "a while" family member at bedside is also concerned because pt has had increase in seizure activity over the past few months due to not taking his medications.

## 2014-05-29 NOTE — ED Notes (Signed)
Went in to d/c pt and pt reports," i think i can give a sample now." Pt in bathroom.
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
## 2014-05-30 NOTE — ED Provider Notes (Signed)
Medical screening examination/treatment/procedure(s) were performed by non-physician practitioner and as supervising physician I was immediately available for consultation/collaboration.   EKG Interpretation None        Alfonzo Feller, DO 05/30/14 2154

## 2014-05-31 LAB — FECAL LACTOFERRIN, QUANT: FECAL LACTOFERRIN: NEGATIVE

## 2014-06-01 LAB — OVA AND PARASITE EXAMINATION

## 2014-06-06 ENCOUNTER — Inpatient Hospital Stay: Payer: Self-pay | Admitting: Family Medicine

## 2014-06-20 ENCOUNTER — Ambulatory Visit (INDEPENDENT_AMBULATORY_CARE_PROVIDER_SITE_OTHER): Payer: Medicare Other | Admitting: Internal Medicine

## 2014-06-20 ENCOUNTER — Other Ambulatory Visit (INDEPENDENT_AMBULATORY_CARE_PROVIDER_SITE_OTHER): Payer: Self-pay | Admitting: Internal Medicine

## 2014-06-20 ENCOUNTER — Encounter (INDEPENDENT_AMBULATORY_CARE_PROVIDER_SITE_OTHER): Payer: Self-pay | Admitting: Internal Medicine

## 2014-06-20 VITALS — BP 102/56 | HR 100 | Temp 97.7°F | Ht 69.0 in | Wt 151.9 lb

## 2014-06-20 DIAGNOSIS — K589 Irritable bowel syndrome without diarrhea: Secondary | ICD-10-CM

## 2014-06-20 DIAGNOSIS — A0472 Enterocolitis due to Clostridium difficile, not specified as recurrent: Secondary | ICD-10-CM

## 2014-06-20 MED ORDER — DICYCLOMINE HCL 20 MG PO TABS: 20.0000 mg | ORAL_TABLET | Freq: Two times a day (BID) | ORAL | Status: DC

## 2014-06-20 NOTE — Patient Instructions (Signed)
OV in 4 weeks. Rx called to pharm[facility].
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
## 2014-06-22 LAB — C. DIFFICILE GDH AND TOXIN A/B
C. difficile GDH: NOT DETECTED
C. difficile Toxin A/B: NOT DETECTED

## 2014-07-18 ENCOUNTER — Ambulatory Visit (INDEPENDENT_AMBULATORY_CARE_PROVIDER_SITE_OTHER): Payer: Self-pay | Admitting: Internal Medicine

## 2014-07-20 ENCOUNTER — Telehealth (INDEPENDENT_AMBULATORY_CARE_PROVIDER_SITE_OTHER): Payer: Self-pay | Admitting: *Deleted

## 2014-07-20 ENCOUNTER — Encounter (INDEPENDENT_AMBULATORY_CARE_PROVIDER_SITE_OTHER): Payer: Self-pay | Admitting: *Deleted

## 2014-07-20 NOTE — Telephone Encounter (Signed)
[place] for his apt on 07/18/14 with Deberah Castle, NP. A NS letter has been mailed.

## 2014-09-01 ENCOUNTER — Telehealth: Payer: Self-pay | Admitting: *Deleted

## 2014-09-01 NOTE — Telephone Encounter (Signed)
Received call from patient.   Reports that he has lung pain in his upper R lobe and has had this for >6 months.   Requested to have MD assess. Advised that patient has multiple No Shows and Late Cancellations. If appointment is made, it must be kept.   Appointment scheduled for Tuesday, 09/13/2014.  MD to be made aware.

## 2014-09-02 NOTE — Telephone Encounter (Signed)
If he does not show up, he will be dismissed due to multiple No shows and cancellations

## 2014-09-13 ENCOUNTER — Ambulatory Visit (INDEPENDENT_AMBULATORY_CARE_PROVIDER_SITE_OTHER): Payer: Medicare Other | Admitting: Family Medicine

## 2014-09-13 ENCOUNTER — Other Ambulatory Visit: Payer: Self-pay | Admitting: Family Medicine

## 2014-09-13 ENCOUNTER — Ambulatory Visit (HOSPITAL_COMMUNITY)
Admission: RE | Admit: 2014-09-13 | Discharge: 2014-09-13 | Disposition: A | Discharge status: Home / Self Care | Payer: Medicare Other | Source: Ambulatory Visit | Attending: Family Medicine | Admitting: Family Medicine

## 2014-09-13 ENCOUNTER — Encounter: Payer: Self-pay | Admitting: Family Medicine

## 2014-09-13 VITALS — BP 130/76 | HR 98 | Temp 98.8°F | Resp 16 | Ht 69.0 in | Wt 163.0 lb

## 2014-09-13 DIAGNOSIS — Z720 Tobacco use: Secondary | ICD-10-CM | POA: Diagnosis not present

## 2014-09-13 DIAGNOSIS — Z13220 Encounter for screening for lipoid disorders: Secondary | ICD-10-CM

## 2014-09-13 DIAGNOSIS — R1310 Dysphagia, unspecified: Secondary | ICD-10-CM

## 2014-09-13 DIAGNOSIS — R0789 Other chest pain: Secondary | ICD-10-CM

## 2014-09-13 DIAGNOSIS — Z1321 Encounter for screening for nutritional disorder: Secondary | ICD-10-CM

## 2014-09-13 DIAGNOSIS — F39 Unspecified mood [affective] disorder: Secondary | ICD-10-CM

## 2014-09-13 DIAGNOSIS — G40909 Epilepsy, unspecified, not intractable, without status epilepticus: Secondary | ICD-10-CM

## 2014-09-13 DIAGNOSIS — Z23 Encounter for immunization: Secondary | ICD-10-CM

## 2014-09-13 DIAGNOSIS — R5383 Other fatigue: Secondary | ICD-10-CM

## 2014-09-13 DIAGNOSIS — F410 Panic disorder [episodic paroxysmal anxiety] without agoraphobia: Secondary | ICD-10-CM

## 2014-09-13 DIAGNOSIS — K219 Gastro-esophageal reflux disease without esophagitis: Secondary | ICD-10-CM

## 2014-09-13 MED ORDER — PROPRANOLOL HCL 10 MG PO TABS: 10.0000 mg | ORAL_TABLET | Freq: Three times a day (TID) | ORAL | Status: DC

## 2014-09-13 MED ORDER — ESOMEPRAZOLE MAGNESIUM 20 MG PO CPDR: 20.0000 mg | DELAYED_RELEASE_CAPSULE | Freq: Every day | ORAL | Status: DC

## 2014-09-13 NOTE — Patient Instructions (Addendum)
Try the nexium once a day before breakfast Refer to GI for the dysphagia  Propranolol 10mg  three times a day  We will call with lab results  Flu shot and pneumonia shot  CXR GET AT [place] F/U 4 weeks for medications

## 2014-09-14 DIAGNOSIS — R1310 Dysphagia, unspecified: Secondary | ICD-10-CM | POA: Insufficient documentation

## 2014-09-14 DIAGNOSIS — K219 Gastro-esophageal reflux disease without esophagitis: Secondary | ICD-10-CM | POA: Insufficient documentation

## 2014-09-14 LAB — CBC WITH DIFFERENTIAL/PLATELET
Basophils Absolute: 0.1 10*3/uL (ref 0.0–0.1)
Basophils Relative: 1 % (ref 0–1)
Eosinophils Absolute: 0.1 10*3/uL (ref 0.0–0.7)
Eosinophils Relative: 2 % (ref 0–5)
HCT: 41.9 % (ref 39.0–52.0)
Hemoglobin: 14.2 g/dL (ref 13.0–17.0)
Lymphocytes Relative: 26 % (ref 12–46)
Lymphs Abs: 1.5 10*3/uL (ref 0.7–4.0)
MCH: 28 pg (ref 26.0–34.0)
MCHC: 33.9 g/dL (ref 30.0–36.0)
MCV: 82.5 fL (ref 78.0–100.0)
MPV: 11.1 fL (ref 9.4–12.4)
Monocytes Absolute: 0.5 10*3/uL (ref 0.1–1.0)
Monocytes Relative: 9 % (ref 3–12)
Neutro Abs: 3.7 10*3/uL (ref 1.7–7.7)
Neutrophils Relative %: 62 % (ref 43–77)
Platelets: 186 10*3/uL (ref 150–400)
RBC: 5.08 MIL/uL (ref 4.22–5.81)
RDW: 15.2 % (ref 11.5–15.5)
WBC: 5.9 10*3/uL (ref 4.0–10.5)

## 2014-09-14 LAB — COMPREHENSIVE METABOLIC PANEL WITH GFR
ALT: 89 U/L — ABNORMAL HIGH (ref 0–53)
AST: 49 U/L — ABNORMAL HIGH (ref 0–37)
Albumin: 4.5 g/dL (ref 3.5–5.2)
Alkaline Phosphatase: 76 U/L (ref 39–117)
BUN: 19 mg/dL (ref 6–23)
CO2: 26 meq/L (ref 19–32)
Calcium: 9.3 mg/dL (ref 8.4–10.5)
Chloride: 104 meq/L (ref 96–112)
Creat: 1.1 mg/dL (ref 0.50–1.35)
Glucose, Bld: 83 mg/dL (ref 70–99)
Potassium: 4.2 meq/L (ref 3.5–5.3)
Sodium: 141 meq/L (ref 135–145)
Total Bilirubin: 0.6 mg/dL (ref 0.2–1.2)
Total Protein: 7.4 g/dL (ref 6.0–8.3)

## 2014-09-14 LAB — HEPATITIS PANEL, ACUTE
HCV Ab: REACTIVE — AB
Hep A IgM: NONREACTIVE
Hep B C IgM: NONREACTIVE
Hepatitis B Surface Ag: NEGATIVE

## 2014-09-14 LAB — LIPID PANEL
Cholesterol: 171 mg/dL (ref 0–200)
HDL: 51 mg/dL
LDL Cholesterol: 90 mg/dL (ref 0–99)
Total CHOL/HDL Ratio: 3.4 ratio
Triglycerides: 149 mg/dL
VLDL: 30 mg/dL (ref 0–40)

## 2014-09-14 LAB — VITAMIN D 25 HYDROXY (VIT D DEFICIENCY, FRACTURES): Vit D, 25-Hydroxy: 23 ng/mL — ABNORMAL LOW (ref 30–100)

## 2014-09-14 LAB — TSH: TSH: 1.736 u[IU]/mL (ref 0.350–4.500)

## 2014-09-14 NOTE — Assessment & Plan Note (Addendum)
Request referral to GI, has seen them for recent IBS/ C diff, will also start him on PPI- given samples of Nexium 24 hour  #14

## 2014-09-14 NOTE — Progress Notes (Signed)
Patient ID: Francis [name], male   DOB: 1971/08/22, 43 y.o.   MRN: [id number]   Subjective:    Patient ID: Francis [name], male    DOB: 1971-05-13, 43 y.o.   MRN: [id number]  Patient presents for R Lung Pain; Dysphagia; Issues with focus; and Anxiety patient here to reestablish care. He has not been seen in greater than a year. At that time he is being treated for apple as well as mood disorder with anxiety and panic attacks. He's had substantial history of substance abuse as well. He is here today with his male partner at our last visit he was in a relationship with this gentleman but then he returned to his wife and now has been back with his shin on for the past year. He states that things are much improved he is no longer on any substances. He does not want to be on any medication for his epilepsy because they cause him to be more hostile he also does not want to be on any type of mood stabilizer because they do do well with his body. He continues to have seizures typically one every 2 months. He also continues to have problems with anxiety and panic attacks he feels his heart racing a lot of the times for full blown attack comes on. He is even have panic attacks prior to having a seizure.  The past few months he's had difficulty swallowing food mostly meat such as chicken. He feels like things get stuck in the back of his throat and in the center of his chest. He also has acid reflux.  He has had pain below his right shoulder blade on and off for about 6 months. He has been to the ER multiple times free the seizure chest pain or other ailments he has not had a chest x-ray and he is a smoker. He denies any shortness of breath denies any significant cough. He states if he moves his arm a certain way or takes a deep breath he can feel the pain in the same exact spot.    Review Of Systems:  GEN- +fatigue, fever, weight loss,weakness, recent illness HEENT- denies eye drainage, change in vision, nasal  discharge, CVS- denies chest pain, palpitations RESP- denies SOB, cough, wheeze ABD- denies N/V, change in stools, abd pain GU- denies dysuria, hematuria, dribbling, incontinence MSK- denies joint pain, +muscle aches, injury Neuro- denies headache, dizziness, syncope, seizure activity       Objective:    BP 130/76 mmHg  Pulse 98  Temp(Src) 98.8 F (37.1 C) (Oral)  Resp 16  Ht 5\' 9"  (1.753 m)  Wt 163 lb (73.936 kg)  BMI 24.06 kg/m2 GEN- NAD, alert and oriented x3 HEENT- PERRL, EOMI, non injected sclera, pink conjunctiva, MMM, oropharynx clear Neck- Supple,  CVS- mild tachycardia, no murmur MSK- TTP below right shoulder blade, no erythema, no spasm, FROM bilat shoulder, normal inspection, Spine NT, r RESP-CTAB ABD-NABS,soft,NT,ND Psych- anxious mood, not depressed, no SI, no hallucinations, well groomed, normal speech EXT- No edema Pulses- Radial 2+        Assessment & Plan:      Problem List Items Addressed This Visit    Tobacco user   Relevant Orders      DG Chest 2 View (Completed)   Panic attacks   Mood disorder (Chronic)    Declines medications for mood, agrees to trial of propranolol due to tachycardia and anxiety    Epilepsy (Chronic)    Declines treatment despite  ongoing seizures since childhood     Other Visit Diagnoses    Other fatigue    -  Primary    Relevant Orders       TSH (Completed)       CBC with Differential (Completed)       Comprehensive metabolic panel (Completed)       Vitamin D, 25-hydroxy (Completed)    Encounter for vitamin deficiency screening        Relevant Orders       Vitamin D, 25-hydroxy (Completed)    Chest wall pain        Obtain CXR, appears more MSK at this time, but he is a smoker    Relevant Orders       DG Chest 2 View (Completed)    Need for prophylactic vaccination and inoculation against influenza        Relevant Orders       Flu Vaccine QUAD 36+ mos IM (Completed)    Need for prophylactic vaccination against  Streptococcus pneumoniae (pneumococcus)        Due to tobacco use    Relevant Orders       Pneumococcal polysaccharide vaccine 23-valent greater than or equal to 2yo subcutaneous/IM (Completed)    Screening cholesterol level        Relevant Orders       Lipid panel (Completed)       Note: This dictation was prepared with Dragon dictation along with smaller phrase technology. Any transcriptional errors that result from this process are unintentional.

## 2014-09-14 NOTE — Assessment & Plan Note (Signed)
Declines treatment despite ongoing seizures since childhood

## 2014-09-14 NOTE — Assessment & Plan Note (Signed)
Declines medications for mood, agrees to trial of propranolol due to tachycardia and anxiety

## 2014-09-15 ENCOUNTER — Other Ambulatory Visit: Payer: Self-pay | Admitting: Family Medicine

## 2014-09-15 LAB — HEPATITIS C RNA QUANTITATIVE

## 2014-09-15 LAB — HIV ANTIBODY (ROUTINE TESTING W REFLEX): HIV 1&2 Ab, 4th Generation: NONREACTIVE

## 2014-09-15 MED ORDER — VITAMIN D (ERGOCALCIFEROL) 1.25 MG (50000 UNIT) PO CAPS: 50000.0000 [IU] | ORAL_CAPSULE | ORAL | Status: DC

## 2014-09-16 ENCOUNTER — Other Ambulatory Visit: Payer: Medicare Other

## 2014-09-16 ENCOUNTER — Other Ambulatory Visit: Payer: Self-pay | Admitting: Family Medicine

## 2014-09-16 DIAGNOSIS — R768 Other specified abnormal immunological findings in serum: Secondary | ICD-10-CM

## 2014-09-19 LAB — HEPATITIS C RNA QUANTITATIVE
HCV QUANT: 6212 [IU]/mL — AB (ref <15)
HCV Quantitative Log: 3.79 {Log} — ABNORMAL HIGH (ref <1.18)

## 2014-09-20 ENCOUNTER — Other Ambulatory Visit: Payer: Self-pay | Admitting: Family Medicine

## 2014-09-20 DIAGNOSIS — R768 Other specified abnormal immunological findings in serum: Secondary | ICD-10-CM

## 2014-09-21 ENCOUNTER — Ambulatory Visit (INDEPENDENT_AMBULATORY_CARE_PROVIDER_SITE_OTHER): Payer: Medicare Other | Admitting: Family Medicine

## 2014-09-21 ENCOUNTER — Encounter: Payer: Self-pay | Admitting: Family Medicine

## 2014-09-21 VITALS — BP 140/76 | HR 88 | Temp 98.6°F | Resp 16 | Ht 69.0 in | Wt 162.0 lb

## 2014-09-21 DIAGNOSIS — J42 Unspecified chronic bronchitis: Secondary | ICD-10-CM | POA: Insufficient documentation

## 2014-09-21 DIAGNOSIS — B1710 Acute hepatitis C without hepatic coma: Secondary | ICD-10-CM

## 2014-09-21 DIAGNOSIS — J410 Simple chronic bronchitis: Secondary | ICD-10-CM

## 2014-09-21 DIAGNOSIS — Z720 Tobacco use: Secondary | ICD-10-CM

## 2014-09-21 DIAGNOSIS — B182 Chronic viral hepatitis C: Secondary | ICD-10-CM | POA: Insufficient documentation

## 2014-09-21 DIAGNOSIS — F1910 Other psychoactive substance abuse, uncomplicated: Secondary | ICD-10-CM

## 2014-09-21 NOTE — Assessment & Plan Note (Signed)
Hep C virus ,+ quant, partner has been treated for Hep C in past History of IV Drug Abuse  HIV negative Liver US to be done, LFT remain elevated for past 3 months Discussed no ETOH, no substance abuse, no Tylenol products Referral to Hep C clinic

## 2014-09-21 NOTE — Assessment & Plan Note (Signed)
Continue Spiriva for now

## 2014-09-21 NOTE — Assessment & Plan Note (Signed)
Discussed need for tobacco cessation  

## 2014-09-21 NOTE — Patient Instructions (Signed)
Continue current medications Avoid alcohol and tylenol Referral to Hepatitis C clinic F/U 8 weeks

## 2014-09-21 NOTE — Progress Notes (Signed)
Patient ID: Francis [name], male   DOB: 1971-07-08, 43 y.o.   MRN: [id number]   Subjective:    Patient ID: Francis [name], male    DOB: [birth year], 43 y.o.   MRN: [id number]  Patient presents for Discuss Lab Results  Patient to follow-up on his lab results. His chest x-ray revealed chronic bronchitis he's was given Spiriva rest amount samples he states that he has not had any of that chest discomfort for the past couple days. He does continue to smoke. His labs also revealed continued elevated LFTs I then did HIV and hepatitis panel was positive for hepatitis C his quant was also elevated. He does have history of intravenous drug use and is in a same-sex relationship with his partner was treated for hepatitis C a few years ago. Vitamin D def also noted on replacement   Review Of Systems:  GEN- denies fatigue, fever, weight loss,weakness, recent illness HEENT- denies eye drainage, change in vision, nasal discharge, CVS- denies chest pain, palpitations RESP- denies SOB, cough, wheeze ABD- denies N/V, change in stools, abd pain GU- denies dysuria, hematuria, dribbling, incontinence MSK- denies joint pain, muscle aches, injury Neuro- denies headache, dizziness, syncope, seizure activity       Objective:    BP 140/76 mmHg  Pulse 88  Temp(Src) 98.6 F (37 C) (Oral)  Resp 16  Ht 5\' 9"  (1.753 m)  Wt 162 lb (73.483 kg)  BMI 23.91 kg/m2 GEN- NAD, alert and oriented x3 CVS- RRR, no murmur RESP-CTAB ABD-NABS,soft,no HSM, ND EXT- No edema         Assessment & Plan:      Problem List Items Addressed This Visit    Hepatitis C - Primary      Note: This dictation was prepared with Dragon dictation along with smaller phrase technology. Any transcriptional errors that result from this process are unintentional.

## 2014-09-28 ENCOUNTER — Ambulatory Visit (HOSPITAL_COMMUNITY)
Admission: RE | Admit: 2014-09-28 | Discharge: 2014-09-28 | Disposition: A | Discharge status: Home / Self Care | Payer: Medicare Other | Source: Ambulatory Visit | Attending: Family Medicine | Admitting: Family Medicine

## 2014-09-28 DIAGNOSIS — F17200 Nicotine dependence, unspecified, uncomplicated: Secondary | ICD-10-CM | POA: Insufficient documentation

## 2014-09-28 DIAGNOSIS — B1710 Acute hepatitis C without hepatic coma: Secondary | ICD-10-CM | POA: Insufficient documentation

## 2014-09-28 DIAGNOSIS — F1910 Other psychoactive substance abuse, uncomplicated: Secondary | ICD-10-CM | POA: Insufficient documentation

## 2014-09-28 DIAGNOSIS — R7989 Other specified abnormal findings of blood chemistry: Secondary | ICD-10-CM | POA: Diagnosis not present

## 2014-10-11 ENCOUNTER — Encounter (INDEPENDENT_AMBULATORY_CARE_PROVIDER_SITE_OTHER): Payer: Self-pay | Admitting: *Deleted

## 2014-10-11 ENCOUNTER — Other Ambulatory Visit: Payer: Self-pay

## 2014-10-12 ENCOUNTER — Other Ambulatory Visit: Payer: Medicare Other

## 2014-10-12 DIAGNOSIS — B182 Chronic viral hepatitis C: Secondary | ICD-10-CM

## 2014-10-12 LAB — IRON: Iron: 66 ug/dL (ref 42–165)

## 2014-10-12 LAB — PROTIME-INR
INR: 1.08 (ref <1.50)
PROTHROMBIN TIME: 14 s (ref 11.6–15.2)

## 2014-10-13 LAB — ANA: ANA: NEGATIVE

## 2014-10-13 LAB — HEPATITIS C RNA QUANTITATIVE
HCV QUANT LOG: 4.52 {Log} — AB (ref <1.18)
HCV Quantitative: 33075 IU/mL — ABNORMAL HIGH (ref <15)

## 2014-10-13 LAB — HEPATITIS B SURFACE ANTIBODY,QUALITATIVE: HEP B S AB: POSITIVE — AB

## 2014-10-18 LAB — HEPATITIS C GENOTYPE

## 2014-11-02 ENCOUNTER — Ambulatory Visit (INDEPENDENT_AMBULATORY_CARE_PROVIDER_SITE_OTHER): Payer: Self-pay | Admitting: Internal Medicine

## 2014-11-09 ENCOUNTER — Ambulatory Visit (INDEPENDENT_AMBULATORY_CARE_PROVIDER_SITE_OTHER): Payer: Medicare Other | Admitting: Internal Medicine

## 2014-11-09 VITALS — BP 120/78 | HR 85 | Temp 98.4°F | Wt 168.0 lb

## 2014-11-09 DIAGNOSIS — B182 Chronic viral hepatitis C: Secondary | ICD-10-CM

## 2014-11-09 NOTE — Patient Instructions (Signed)
Date 11/09/2014  Dear Francis [name], As discussed in the [facility], your hepatitis C therapy will include the following medications:          Harvoni 90mg /400mg  tablet:           Take 1 tablet by mouth once daily   Please note that ALL MEDICATIONS WILL START ON THE SAME DATE for a total of 12 weeks. ---------------------------------------------------------------- Your HCV Treatment Start Date: TBA   Your HCV genotype:  1a    Liver Fibrosis: TBD    ---------------------------------------------------------------- YOUR PHARMACY CONTACT:   [facility] [place] [facility] [place] Phone: [phone] Hours: Monday to Friday 7:30 am to 6:00 pm   Please always contact your pharmacy at least 3-4 business days before you run out of medications to ensure your next month's medication is ready or 1 week prior to running out if you receive it by mail.  Remember, each prescription is for 28 days. ---------------------------------------------------------------- GENERAL NOTES REGARDING YOUR HEPATITIS C MEDICATION:  SOFOSBUVIR/LEDIPASVIR (HARVONI): - Harvoni tablet is taken daily with OR without food. - The tablets are orange. - The tablets should be stored at room temperature.  - Acid reducing agents such as H2 blockers (ie. Pepcid (famotidine), Zantac (ranitidine), Tagamet (cimetidine), Axid (nizatidine) and proton pump inhibitors (ie. Prilosec (omeprazole), Protonix (pantoprazole), Nexium (esomeprazole), or Aciphex (rabeprazole)) can decrease effectiveness of Harvoni. Do not take until you have discussed with a health care provider.    -Antacids that contain magnesium and/or aluminum hydroxide (ie. Milk of Magensia, Rolaids, Gaviscon, Maalox, Mylanta, an dArthritis Pain Formula)can reduce absorption of Harvoni, so take them at least 4 hours before or after Harvoni.  -Calcium carbonate (calcium supplements or antacids such as Tums, Caltrate,  Os-Cal)needs to be taken at least 4 hours hours before or after Harvoni.  -St. John's wort or any products that contain St. John's wort like some herbal supplements  Please inform the office prior to starting any of these medications.  - The common side effects with Harvoni:      1. Fatigue      2. Headache      3. Nausea      4. Diarrhea      5. Insomnia   Support Path is a suite of resources designed to help patients start with HARVONI and move toward treatment completion [place] helps patients access therapy and get off to an efficient start  Benefits investigation and prior authorization support Co-pay and other financial assistance A specialty pharmacy finder CO-PAY COUPON The [facility] co-pay coupon may help eligible patients lower their out-of-pocket costs. With a co-pay coupon, most eligible patients may pay no more than $5 per co-pay (restrictions apply) www.harvoni.com call (815)491-9532 Not valid for patients enrolled in government healthcare prescription drug programs, such as Medicare Part D and Medicaid. Patients in the coverage gap known as the "donut hole" also are not eligible The HARVONI co-pay coupon program will cover the out-of-pocket costs for HARVONI prescriptions up to a maximum of 25% of the catalog price of a 12-week regimen of HARVONI  Please note that this only lists the most common side effects and is NOT a comprehensive list of the potential side effects of these medications. For more information, please review the drug information sheets that come with your medication package from the pharmacy.  ---------------------------------------------------------------- GENERAL HELPFUL HINTS ON HCV THERAPY: 1. No alcohol. 2. Protect against sun-sensitivity/sunburns (wear sunglasses, hat, long sleeves, pants  and sunscreen). 3. Stay well-hydrated/well-moisturized. 4. Notify the [facility] of any changes in your other over-the-counter/herbal or  prescription medications. 5. If you miss a dose of your medication, take the missed dose as soon as you remember. Return to your regular time/dose schedule the next day.  6.  Do not stop taking your medications without first talking with your healthcare provider. 7.  You may take Tylenol (acetaminophen), as long as the dose is less than 2000 mg (OR no more than 4 tablets of the Tylenol Extra Strengths 500mg  tablet) in 24 hours. 8.  You will need to obtain routine labs and/or office visits at [facility] at weeks 2, 4, 8,  and 12 as well as 12 and 24 weeks after completion of treatment.   Francis [name], [place] for [address] [place] [place], [place]  [id number] [phone]

## 2014-11-09 NOTE — Progress Notes (Signed)
+Francis [name] is a 44 y.o. male who presents for initial evaluation and management of a positive Hepatitis C antibody test.  Patient tested positive last year due to elevated LFTs. Hepatitis C risk factors present are: IV drug abuse (details: last used 5 years ago). Patient denies sexual contact with person with liver disease, tattoos. Patient has had other studies performed. Results: hepatitis C RNA by PCR, result: positive. Patient has not had prior treatment for Hepatitis C. Patient does not have a past history of liver disease. Patient does not have a family history of liver disease.   HPI: He was recently tested and asks about transmission.  Currently without any drug use and has had negative drug screen x 2 in the last year.    Patient does not have documented immunity to Hepatitis A. Patient does have documented immunity to Hepatitis B.     Review of Systems A comprehensive review of systems was negative.   Past Medical History  Diagnosis Date  . Seizures   . Depression   . Anxiety   . Drug abuse     No drug abuse for greater th an a year  . Bipolar 1 disorder   . Noncompliance with medication regimen   . Colitis     Prior to Admission medications   Medication Sig Start Date End Date Taking? Authorizing Provider  dicyclomine (BENTYL) 20 MG tablet Take 1 tablet (20 mg total) by mouth 2 (two) times daily before a meal. 06/20/14  Yes Butch Penny, NP  esomeprazole (NEXIUM) 20 MG capsule Take 1 capsule (20 mg total) by mouth daily at 12 noon. 09/13/14  Yes Alycia Rossetti, MD  Melatonin 10 MG TABS Take 30 mg by mouth at bedtime as needed.   Yes Historical Provider, MD  propranolol (INDERAL) 10 MG tablet Take 1 tablet (10 mg total) by mouth 3 (three) times daily. 09/13/14  Yes Alycia Rossetti, MD  Vitamin D, Ergocalciferol, (DRISDOL) 50000 UNITS CAPS capsule Take 1 capsule (50,000 Units total) by mouth every 7 (seven) days. 09/15/14  Yes Alycia Rossetti, MD  ENSURE (ENSURE)  Take 237 mLs by mouth as needed (Drinks occassionally due to sickness).    Historical Provider, MD  tiotropium (SPIRIVA) 18 MCG inhalation capsule Place 18 mcg into inhaler and inhale daily.    Historical Provider, MD    Allergies  Allergen Reactions  . Nsaids Hives    History  Substance Use Topics  . Smoking status: Current Every Day Smoker -- 1.00 packs/day for 30 years    Types: Cigarettes  . Smokeless tobacco: Former Systems developer     Comment: occasionally  . Alcohol Use: 0.0 oz/week    0 Not specified per week     Comment: occasionally    No family history on file.    Objective:   Filed Vitals:   11/09/14 1418  BP: 120/78  Pulse: 85  Temp: 98.4 F (36.9 C)   in no apparent distress and alert HEENT: anicteric Cor RRR and No murmurs clear Bowel sounds are normal, liver is not enlarged, spleen is not enlarged peripheral pulses normal, no pedal edema, no clubbing or cyanosis negative for - jaundice, spider hemangioma, telangiectasia, palmar erythema, ecchymosis and atrophy  Laboratory Genotype:  Lab Results  Component Value Date   HCVGENOTYPE 1a 10/12/2014   HCV viral load:  Lab Results  Component Value Date   HCVQUANT 33075* 10/12/2014   Lab Results  Component Value Date   WBC 5.9 09/13/2014  HGB 14.2 09/13/2014   HCT 41.9 09/13/2014   MCV 82.5 09/13/2014   PLT 186 09/13/2014    Lab Results  Component Value Date   CREATININE 1.10 09/13/2014   BUN 19 09/13/2014   NA 141 09/13/2014   K 4.2 09/13/2014   CL 104 09/13/2014   CO2 26 09/13/2014    Lab Results  Component Value Date   ALT 89* 09/13/2014   AST 49* 09/13/2014   ALKPHOS 76 09/13/2014   BILITOT 0.6 09/13/2014   INR 1.08 10/12/2014      Assessment: Hepatitis C genotype 1a  Plan: 1) Patient counseled extensively on limiting acetaminophen to no more than 2 grams daily, avoidance of alcohol. 2) Transmission discussed with patient including sexual transmission, sharing razors and toothbrush.    3) Will need referral to gastroenterology if concern for cirrhosis 4) Will need referral for substance abuse counseling: No. 5) Will prescribe Harvoni for 12 weeks once work up complete 6) Hepatitis A vaccine No. 7) Hepatitis B vaccine No. 8) Pneumovax vaccine if concern for cirrhosis 9) will follow up after elastography

## 2014-11-18 ENCOUNTER — Ambulatory Visit (HOSPITAL_COMMUNITY): Payer: Medicare Other

## 2014-12-01 ENCOUNTER — Ambulatory Visit (INDEPENDENT_AMBULATORY_CARE_PROVIDER_SITE_OTHER): Payer: Self-pay | Admitting: Internal Medicine

## 2014-12-13 ENCOUNTER — Ambulatory Visit: Payer: Self-pay | Admitting: Internal Medicine

## 2014-12-26 ENCOUNTER — Encounter (INDEPENDENT_AMBULATORY_CARE_PROVIDER_SITE_OTHER): Payer: Self-pay | Admitting: *Deleted

## 2014-12-26 ENCOUNTER — Telehealth (INDEPENDENT_AMBULATORY_CARE_PROVIDER_SITE_OTHER): Payer: Self-pay | Admitting: *Deleted

## 2014-12-26 NOTE — Telephone Encounter (Signed)
Francis [name] No Showed for his apt with Deberah Castle, NP on 12/01/14. A NS letter has been mailed.

## 2015-02-03 ENCOUNTER — Ambulatory Visit (INDEPENDENT_AMBULATORY_CARE_PROVIDER_SITE_OTHER): Payer: Medicare Other | Admitting: Family Medicine

## 2015-02-03 ENCOUNTER — Encounter: Payer: Self-pay | Admitting: Family Medicine

## 2015-02-03 VITALS — BP 126/64 | HR 84 | Temp 98.3°F | Resp 14 | Ht 69.0 in | Wt 182.0 lb

## 2015-02-03 DIAGNOSIS — F410 Panic disorder [episodic paroxysmal anxiety] without agoraphobia: Secondary | ICD-10-CM

## 2015-02-03 DIAGNOSIS — R1310 Dysphagia, unspecified: Secondary | ICD-10-CM | POA: Diagnosis not present

## 2015-02-03 DIAGNOSIS — F411 Generalized anxiety disorder: Secondary | ICD-10-CM | POA: Diagnosis not present

## 2015-02-03 DIAGNOSIS — B182 Chronic viral hepatitis C: Secondary | ICD-10-CM

## 2015-02-03 DIAGNOSIS — G4730 Sleep apnea, unspecified: Secondary | ICD-10-CM | POA: Diagnosis not present

## 2015-02-03 DIAGNOSIS — Z720 Tobacco use: Secondary | ICD-10-CM | POA: Diagnosis not present

## 2015-02-03 DIAGNOSIS — J410 Simple chronic bronchitis: Secondary | ICD-10-CM

## 2015-02-03 MED ORDER — PROPRANOLOL HCL 20 MG PO TABS: 20.0000 mg | ORAL_TABLET | Freq: Three times a day (TID) | ORAL | Status: DC

## 2015-02-03 NOTE — Progress Notes (Signed)
Patient ID: Francis [name], male   DOB: [birth year], 44 y.o.   MRN: [id number]   Subjective:    Patient ID: Francis [name], male    DOB: 1971-09-28, 44 y.o.   MRN: [id number]  Patient presents for F/U  patient to follow-up medications. He states the propanolol helps with his tremor and his anxiety but he feels like it is actually wearing off some he also is noticed that he has not had a seizure since he has been on the medication which is been about 8 months. He wanted to see if this propanolol Be increased to help more to his anxiety symptoms since they're starting to sneak back up on him.  Hepatitis C he was seen one time at the hepatitis C clinic and it seems he did not like the way that information was presented to him he felt like he was not a candidate for treatment based on what the doctor said therefore he did not go for the ultrasound or any follow-up. He also missed his follow with gastroenterology for his difficulty swallowing as well.  He is here with his male partner and they also bring up that he has had some problems with his sleep he seems very fatigued during the day he snores a lot at night time and his partner states that he stops breathing in his sleep. His partner actually put his C Pap machine on him and he states that he sleeps much better with this and felt great during the day.   Bronchitis he has had not had any more problems with shortness of breath the Spiriva did help but after the samples ran out he did not use any of the medication any further.    Review Of Systems:  GEN- denies fatigue, fever, weight loss,weakness, recent illness HEENT- denies eye drainage, change in vision, nasal discharge, CVS- denies chest pain, palpitations RESP- denies SOB, cough, wheeze ABD- denies N/V, change in stools, abd pain GU- denies dysuria, hematuria, dribbling, incontinence MSK- denies joint pain, muscle aches, injury Neuro- denies headache, dizziness, syncope, seizure  activity       Objective:    BP 126/64 mmHg  Pulse 84  Temp(Src) 98.3 F (36.8 C) (Oral)  Resp 14  Ht 5\' 9"  (1.753 m)  Wt 182 lb (82.555 kg)  BMI 26.86 kg/m2 GEN- NAD, alert and oriented x3 HEENT- PERRL, EOMI, non injected sclera, pink conjunctiva, MMM, oropharynx clear CVS- RRR, no murmur RESP-CTAB ABD-NABS,soft,NT,ND Psych- mild anxiety, good eye contact, not depressed, answers directly EXT- No edema Pulses- Radial, DP- 2+        Assessment & Plan:      Problem List Items Addressed This Visit      Unprioritized   Tobacco user   Sleep apnea    Concern for probable sleep apnea, he has a normal neck circumference but symptoms of apnea witnessed by partner and daytime fatigue Will set up for sleep study      Relevant Orders   Split night study   Panic attacks    propranalol increased to 20mg  TID      GAD (generalized anxiety disorder)    Per above propranolol increaesd      Dysphagia   Relevant Orders   Ambulatory referral to Gastroenterology   Chronic hepatitis C without hepatic coma - Primary    Will refer to a new provider in [place], they can also evaluate him for the problems with his swallowing He would benefit from treatment He has not  had any substance abuse > 6 months      Relevant Orders   Ambulatory referral to Gastroenterology   Chronic bronchitis    PFT to be done, pending results will see if he needs Spiriva          Note: This dictation was prepared with Dragon dictation along with smaller phrase technology. Any transcriptional errors that result from this process are unintentional.

## 2015-02-03 NOTE — Patient Instructions (Addendum)
Referral to [facility] Increase propranolol to 20mg  three times a day  PFT/ Sleep study done  F/U 4 months

## 2015-02-05 DIAGNOSIS — G4730 Sleep apnea, unspecified: Secondary | ICD-10-CM | POA: Insufficient documentation

## 2015-02-05 DIAGNOSIS — F411 Generalized anxiety disorder: Secondary | ICD-10-CM | POA: Insufficient documentation

## 2015-02-05 HISTORY — DX: Sleep apnea, unspecified: G47.30

## 2015-02-05 NOTE — Assessment & Plan Note (Signed)
Concern for probable sleep apnea, he has a normal neck circumference but symptoms of apnea witnessed by partner and daytime fatigue Will set up for sleep study

## 2015-02-05 NOTE — Assessment & Plan Note (Signed)
propranalol increased to 20mg  TID

## 2015-02-05 NOTE — Assessment & Plan Note (Signed)
PFT to be done, pending results will see if he needs Spiriva

## 2015-02-05 NOTE — Assessment & Plan Note (Signed)
Will refer to a new provider in [place], they can also evaluate him for the problems with his swallowing He would benefit from treatment He has not had any substance abuse > 6 months

## 2015-02-05 NOTE — Assessment & Plan Note (Signed)
Per above propranolol increaesd

## 2015-02-06 ENCOUNTER — Telehealth: Payer: Self-pay | Admitting: *Deleted

## 2015-02-06 NOTE — Telephone Encounter (Signed)
Received a call from [place] at [facility] stating that pt has a Sleep study appointment scheduled for April 27 at 8pm at [facility] and needed a Ov notes stating why needs. I faxed that referral to Sleep center to [facility].

## 2015-02-22 ENCOUNTER — Ambulatory Visit: Payer: Medicare Other | Attending: Family Medicine | Admitting: Sleep Medicine

## 2015-02-22 DIAGNOSIS — G4730 Sleep apnea, unspecified: Secondary | ICD-10-CM | POA: Diagnosis present

## 2015-02-24 NOTE — Sleep Study (Signed)
  Rice A. Merlene Laughter, MD     [email or web address]        NOCTURNAL POLYSOMNOGRAM    LOCATION: SLEEP LAB FACILITY: [facility]   PHYSICIAN: Perrin Gens A. Merlene Laughter, M.D.   DATE OF STUDY: 02/20/2015.   REFERRING PHYSICIAN: Jobin Montelongo.   INDICATIONS: The patient is a 44 year old who presents with snoring, fatigue and witnessed apneas. Also history of hypersomnia.  MEDICATIONS:  Prior to Admission medications   Medication Sig Start Date End Date Taking? Authorizing Provider  ENSURE (ENSURE) Take 237 mLs by mouth as needed (Drinks occassionally due to sickness).    Historical Provider, MD  Melatonin 10 MG TABS Take 30 mg by mouth at bedtime as needed.    Historical Provider, MD  propranolol (INDERAL) 20 MG tablet Take 1 tablet (20 mg total) by mouth 3 (three) times daily. 02/03/15   Alycia Rossetti, MD  tiotropium (SPIRIVA) 18 MCG inhalation capsule Place 18 mcg into inhaler and inhale daily.    Historical Provider, MD      EPWORTH SLEEPINESS SCALE: 6.   BMI: 36.   ARCHITECTURAL SUMMARY: This is a split-night recording with the initial portion be a diagnostic in the second portion of titration recording. Total recording time was 434 minutes. Sleep efficiency 68 %. Sleep latency 60 minutes. REM latency 334 minutes. Stage NI 4 %, N2 62 % and N3 30 % and REM sleep 3 %.    RESPIRATORY DATA:  Baseline oxygen saturation is 95 %. The lowest saturation is 79 %. The diagnostic AHI is 76. The patient was placed on positive pressure starting at 5 and increased rate. The optimal pressure is 8 with resolution of obstructive events and snoring.  LIMB MOVEMENT SUMMARY: PLM index 14.   ELECTROCARDIOGRAM SUMMARY: Average heart rate is 76 with no significant dysrhythmias observed.   IMPRESSION:  1. Severe obstructive sleep apnea syndrome which responds well to his CPAP of 8. 2. Mild periodic limb movement disorder of sleep.  Thanks for this referral.  Daemien Fronczak A. Merlene Laughter, M.D.  Diplomat, Tax adviser of Sleep Medicine.

## 2015-02-24 NOTE — Sleep Study (Signed)
  Marion A. Merlene Laughter, MD     [email or web address]        NOCTURNAL POLYSOMNOGRAM    LOCATION: SLEEP LAB FACILITY: [facility]   PHYSICIAN: Laela Deviney A. Merlene Laughter, M.D.   DATE OF STUDY: 02/22/2015.   REFERRING PHYSICIAN: K Ringgold.   INDICATIONS: The patient is a 44 year old presents with restless sleep, snoring and hypersomnia.  MEDICATIONS:  Prior to Admission medications   Medication Sig Start Date End Date Taking? Authorizing Provider  ENSURE (ENSURE) Take 237 mLs by mouth as needed (Drinks occassionally due to sickness).    Historical Provider, MD  Melatonin 10 MG TABS Take 30 mg by mouth at bedtime as needed.    Historical Provider, MD  propranolol (INDERAL) 20 MG tablet Take 1 tablet (20 mg total) by mouth 3 (three) times daily. 02/03/15   Alycia Rossetti, MD  tiotropium (SPIRIVA) 18 MCG inhalation capsule Place 18 mcg into inhaler and inhale daily.    Historical Provider, MD      EPWORTH SLEEPINESS SCALE: 10.   BMI: 26.   ARCHITECTURAL SUMMARY: Total recording time was 409 minutes. Sleep efficiency 78 %. Sleep latency 29 minutes. REM latency 75 minutes. Stage NI 9 %, N2 68 % and N3 0 % and REM sleep 22 %. The patient's sleep was fragmented even though the efficiency is normal. Additionally, there is significant alpha intrusion and alpha delta sleep.   RESPIRATORY DATA:  Baseline oxygen saturation is 97 %. The lowest saturation is 90 %. The diagnostic AHI is 4. The RDI is 5. The REM AHI is 0.  LIMB MOVEMENT SUMMARY: PLM index 16.   ELECTROCARDIOGRAM SUMMARY: Average heart rate is 70 with no significant dysrhythmias observed.   IMPRESSION:  1. Abnormal sleep architecture with fragmented sleep and significantly reduced slow wave sleep. This can be associated with nonrestorative sleep. 2. Significant alpha intrusion/alpha delta sleep which can also be associated with nonrestorative sleep. This is also associated with chronic pain syndromes including  fibromyalgia. 3. Moderate periodic limb movement disorder of sleep.  Thanks for this referral.  Emilyn Ruble A. Merlene Laughter, M.D. Diplomat, Tax adviser of Sleep Medicine.

## 2015-03-03 ENCOUNTER — Encounter: Payer: Self-pay | Admitting: Family Medicine

## 2015-06-05 ENCOUNTER — Other Ambulatory Visit: Payer: Self-pay | Admitting: Family Medicine

## 2015-06-05 NOTE — Telephone Encounter (Signed)
Refill appropriate and filled per protocol. 

## 2015-06-07 ENCOUNTER — Ambulatory Visit (INDEPENDENT_AMBULATORY_CARE_PROVIDER_SITE_OTHER): Payer: Medicare Other | Admitting: Family Medicine

## 2015-06-07 ENCOUNTER — Encounter: Payer: Self-pay | Admitting: Family Medicine

## 2015-06-07 VITALS — BP 118/74 | HR 78 | Temp 98.5°F | Resp 16 | Ht 69.0 in | Wt 184.0 lb

## 2015-06-07 DIAGNOSIS — B86 Scabies: Secondary | ICD-10-CM | POA: Diagnosis not present

## 2015-06-07 DIAGNOSIS — G4730 Sleep apnea, unspecified: Secondary | ICD-10-CM

## 2015-06-07 DIAGNOSIS — B182 Chronic viral hepatitis C: Secondary | ICD-10-CM | POA: Diagnosis not present

## 2015-06-07 DIAGNOSIS — F411 Generalized anxiety disorder: Secondary | ICD-10-CM | POA: Diagnosis not present

## 2015-06-07 HISTORY — DX: Scabies: B86

## 2015-06-07 MED ORDER — PERMETHRIN 5 % EX CREA: 1.0000 "application " | TOPICAL_CREAM | Freq: Once | CUTANEOUS | Status: DC

## 2015-06-07 NOTE — Assessment & Plan Note (Signed)
Controlled with propranolol.

## 2015-06-07 NOTE — Assessment & Plan Note (Signed)
Recheck LFT, refer to GI again for opionion about treatment

## 2015-06-07 NOTE — Patient Instructions (Addendum)
Release of records- [facility] We will call with lab results Work on healthy eating, you need to lose 10lbs Referral back to GI in [place] F/U 6 months

## 2015-06-07 NOTE — Progress Notes (Signed)
Patient ID: Francis [name], male   DOB: [birth year], 44 y.o.   MRN: [id number]    Subjective:    Patient ID: Francis [name], male    DOB: 1971-03-11, 44 y.o.   MRN: [id number]  Patient presents for 4 month F/U and Skin Irritation  patient here to follow-up he is taking his propanolol as described has not had any seizures in his anxiety is controlled. His divorce is now final and he is living with his male partner. He never received a phone call about the gastroenterologist in [place] for his hepatitis C however it is noted in the chart where he was called for an appointment in May. He is still considering pursuing treatment for hepatitis C.  He was exposed to scabies by a family friend there is also an older gentleman who lives in the home who actually contracted to scabies as well he was treated by his doctor earlier this week. Both Francis [name] and his partner has had itchy red spots. He has a rash on both arms as well as his buttocks region between his fingers down his legs. His is been present for the past 2 weeks  He does tell me that he was seen at the [facility] secondary to some chest pain cardiac cause was ruled out.  Review Of Systems: per above   GEN- denies fatigue, fever, weight loss,weakness, recent illness HEENT- denies eye drainage, change in vision, nasal discharge, CVS- denies chest pain, palpitations RESP- denies SOB, cough, wheeze ABD- denies N/V, change in stools, abd pain GU- denies dysuria, hematuria, dribbling, incontinence MSK- denies joint pain, muscle aches, injury Neuro- denies headache, dizziness, syncope, seizure activity       Objective:    BP 118/74 mmHg  Pulse 78  Temp(Src) 98.5 F (36.9 C) (Oral)  Resp 16  Ht 5\' 9"  (1.753 m)  Wt 184 lb (83.462 kg)  BMI 27.16 kg/m2 GEN- NAD, alert and oriented x3 HEENT- PERRL, EOMI, non injected sclera, pink conjunctiva, MMM, oropharynx clear CVS- RRR, no murmur RESP-CTAB Psych- normal affect and  mood Skin- erythematous excoriated papular lesions bilat arms, hands, between web spaces, few lesions on legs, and abdomen  EXT- No edema Pulses- Radial, 2+        Assessment & Plan:      Problem List Items Addressed This Visit    Scabies    Treat with permethrin      GAD (generalized anxiety disorder)    Controlled with propranolol      Chronic hepatitis C without hepatic coma - Primary    Recheck LFT, refer to GI again for opionion about treatment       Relevant Orders   CBC with Differential/Platelet   Comprehensive metabolic panel      Note: This dictation was prepared with Dragon dictation along with smaller phrase technology. Any transcriptional errors that result from this process are unintentional.

## 2015-06-07 NOTE — Assessment & Plan Note (Signed)
Continues to benefit from CPAP Use, per download using 70% of time

## 2015-06-07 NOTE — Assessment & Plan Note (Signed)
Treat with permethrin

## 2015-06-08 LAB — CBC WITH DIFFERENTIAL/PLATELET
BASOS ABS: 0.1 10*3/uL (ref 0.0–0.1)
BASOS PCT: 1 % (ref 0–1)
Eosinophils Absolute: 0.3 10*3/uL (ref 0.0–0.7)
Eosinophils Relative: 4 % (ref 0–5)
HCT: 42.4 % (ref 39.0–52.0)
HEMOGLOBIN: 14.3 g/dL (ref 13.0–17.0)
LYMPHS PCT: 37 % (ref 12–46)
Lymphs Abs: 2.5 10*3/uL (ref 0.7–4.0)
MCH: 28.8 pg (ref 26.0–34.0)
MCHC: 33.7 g/dL (ref 30.0–36.0)
MCV: 85.5 fL (ref 78.0–100.0)
MONO ABS: 0.5 10*3/uL (ref 0.1–1.0)
MONOS PCT: 8 % (ref 3–12)
MPV: 10.7 fL (ref 8.6–12.4)
NEUTROS ABS: 3.4 10*3/uL (ref 1.7–7.7)
Neutrophils Relative %: 50 % (ref 43–77)
Platelets: 178 10*3/uL (ref 150–400)
RBC: 4.96 MIL/uL (ref 4.22–5.81)
RDW: 14.2 % (ref 11.5–15.5)
WBC: 6.7 10*3/uL (ref 4.0–10.5)

## 2015-06-08 LAB — COMPREHENSIVE METABOLIC PANEL
ALBUMIN: 4.4 g/dL (ref 3.6–5.1)
ALK PHOS: 54 U/L (ref 40–115)
ALT: 105 U/L — ABNORMAL HIGH (ref 9–46)
AST: 49 U/L — ABNORMAL HIGH (ref 10–40)
BILIRUBIN TOTAL: 1 mg/dL (ref 0.2–1.2)
BUN: 21 mg/dL (ref 7–25)
CALCIUM: 9.8 mg/dL (ref 8.6–10.3)
CO2: 25 mmol/L (ref 20–31)
CREATININE: 0.98 mg/dL (ref 0.60–1.35)
Chloride: 106 mmol/L (ref 98–110)
Glucose, Bld: 86 mg/dL (ref 70–99)
Potassium: 4.1 mmol/L (ref 3.5–5.3)
Sodium: 142 mmol/L (ref 135–146)
Total Protein: 7.1 g/dL (ref 6.1–8.1)

## 2015-06-12 ENCOUNTER — Encounter: Payer: Self-pay | Admitting: *Deleted

## 2015-06-22 DIAGNOSIS — R1310 Dysphagia, unspecified: Secondary | ICD-10-CM | POA: Diagnosis not present

## 2015-06-22 DIAGNOSIS — K219 Gastro-esophageal reflux disease without esophagitis: Secondary | ICD-10-CM | POA: Diagnosis not present

## 2015-06-22 DIAGNOSIS — K2900 Acute gastritis without bleeding: Secondary | ICD-10-CM | POA: Diagnosis not present

## 2015-11-01 DIAGNOSIS — K259 Gastric ulcer, unspecified as acute or chronic, without hemorrhage or perforation: Secondary | ICD-10-CM | POA: Diagnosis not present

## 2015-11-01 DIAGNOSIS — D128 Benign neoplasm of rectum: Secondary | ICD-10-CM | POA: Diagnosis not present

## 2015-11-01 DIAGNOSIS — K222 Esophageal obstruction: Secondary | ICD-10-CM | POA: Diagnosis not present

## 2015-11-01 DIAGNOSIS — K2980 Duodenitis without bleeding: Secondary | ICD-10-CM | POA: Diagnosis not present

## 2015-11-06 DIAGNOSIS — D128 Benign neoplasm of rectum: Secondary | ICD-10-CM | POA: Diagnosis not present

## 2015-11-06 DIAGNOSIS — K5289 Other specified noninfective gastroenteritis and colitis: Secondary | ICD-10-CM | POA: Diagnosis not present

## 2015-11-06 DIAGNOSIS — K2980 Duodenitis without bleeding: Secondary | ICD-10-CM | POA: Diagnosis not present

## 2015-11-06 DIAGNOSIS — K635 Polyp of colon: Secondary | ICD-10-CM | POA: Diagnosis not present

## 2015-11-06 DIAGNOSIS — K219 Gastro-esophageal reflux disease without esophagitis: Secondary | ICD-10-CM | POA: Diagnosis not present

## 2015-11-06 DIAGNOSIS — I10 Essential (primary) hypertension: Secondary | ICD-10-CM | POA: Diagnosis not present

## 2015-11-06 DIAGNOSIS — R197 Diarrhea, unspecified: Secondary | ICD-10-CM | POA: Diagnosis not present

## 2015-11-06 DIAGNOSIS — R194 Change in bowel habit: Secondary | ICD-10-CM | POA: Diagnosis not present

## 2015-11-06 LAB — HM COLONOSCOPY

## 2015-11-20 ENCOUNTER — Other Ambulatory Visit: Payer: Self-pay | Admitting: Family Medicine

## 2015-11-20 NOTE — Telephone Encounter (Signed)
Refill appropriate and filled per protocol. 

## 2015-11-22 ENCOUNTER — Encounter: Payer: Self-pay | Admitting: *Deleted

## 2015-11-28 DIAGNOSIS — Z86010 Personal history of colonic polyps: Secondary | ICD-10-CM | POA: Diagnosis not present

## 2015-11-28 DIAGNOSIS — K635 Polyp of colon: Secondary | ICD-10-CM | POA: Diagnosis not present

## 2015-11-28 DIAGNOSIS — R1013 Epigastric pain: Secondary | ICD-10-CM | POA: Diagnosis not present

## 2015-11-28 DIAGNOSIS — R197 Diarrhea, unspecified: Secondary | ICD-10-CM | POA: Diagnosis not present

## 2015-12-11 ENCOUNTER — Ambulatory Visit: Payer: Medicare Other | Admitting: Family Medicine

## 2016-01-15 DIAGNOSIS — B182 Chronic viral hepatitis C: Secondary | ICD-10-CM | POA: Diagnosis not present

## 2016-02-05 ENCOUNTER — Encounter: Payer: Self-pay | Admitting: Family Medicine

## 2016-02-05 ENCOUNTER — Ambulatory Visit (INDEPENDENT_AMBULATORY_CARE_PROVIDER_SITE_OTHER): Payer: Medicare Other | Admitting: Family Medicine

## 2016-02-05 VITALS — BP 130/72 | HR 78 | Temp 98.6°F | Resp 16 | Ht 69.0 in | Wt 196.0 lb

## 2016-02-05 DIAGNOSIS — R635 Abnormal weight gain: Secondary | ICD-10-CM | POA: Diagnosis not present

## 2016-02-05 DIAGNOSIS — G4730 Sleep apnea, unspecified: Secondary | ICD-10-CM | POA: Diagnosis not present

## 2016-02-05 DIAGNOSIS — K219 Gastro-esophageal reflux disease without esophagitis: Secondary | ICD-10-CM

## 2016-02-05 DIAGNOSIS — F411 Generalized anxiety disorder: Secondary | ICD-10-CM

## 2016-02-05 DIAGNOSIS — K589 Irritable bowel syndrome without diarrhea: Secondary | ICD-10-CM

## 2016-02-05 NOTE — Patient Instructions (Signed)
Stop the Advil and Aleve Take the Viberzi, take 1 tablet twice a day for diarrhea- samples given Take Prilosec once a day We will call with lab results Continue propranolol at current dose until labs reviewed F/U pending results

## 2016-02-06 ENCOUNTER — Encounter: Payer: Self-pay | Admitting: Family Medicine

## 2016-02-06 LAB — CBC WITH DIFFERENTIAL/PLATELET
BASOS ABS: 68 {cells}/uL (ref 0–200)
Basophils Relative: 1 %
EOS ABS: 136 {cells}/uL (ref 15–500)
Eosinophils Relative: 2 %
HEMATOCRIT: 43.2 % (ref 38.5–50.0)
HEMOGLOBIN: 14 g/dL (ref 13.0–17.0)
Lymphocytes Relative: 42 %
Lymphs Abs: 2856 cells/uL (ref 850–3900)
MCH: 28 pg (ref 27.0–33.0)
MCHC: 32.4 g/dL (ref 32.0–36.0)
MCV: 86.4 fL (ref 80.0–100.0)
MONOS PCT: 7 %
MPV: 10.8 fL (ref 7.5–12.5)
Monocytes Absolute: 476 cells/uL (ref 200–950)
Neutro Abs: 3264 cells/uL (ref 1500–7800)
Neutrophils Relative %: 48 %
Platelets: 175 10*3/uL (ref 140–400)
RBC: 5 MIL/uL (ref 4.20–5.80)
RDW: 13.8 % (ref 11.0–15.0)
WBC: 6.8 10*3/uL (ref 3.8–10.8)

## 2016-02-06 LAB — COMPREHENSIVE METABOLIC PANEL
ALBUMIN: 4.4 g/dL (ref 3.6–5.1)
ALT: 11 U/L (ref 9–46)
AST: 16 U/L (ref 10–40)
Alkaline Phosphatase: 61 U/L (ref 40–115)
BILIRUBIN TOTAL: 1.1 mg/dL (ref 0.2–1.2)
BUN: 19 mg/dL (ref 7–25)
CALCIUM: 9.3 mg/dL (ref 8.6–10.3)
CHLORIDE: 106 mmol/L (ref 98–110)
CO2: 25 mmol/L (ref 20–31)
Creat: 1.18 mg/dL (ref 0.60–1.35)
Glucose, Bld: 88 mg/dL (ref 70–99)
Potassium: 4 mmol/L (ref 3.5–5.3)
Sodium: 141 mmol/L (ref 135–146)
Total Protein: 6.9 g/dL (ref 6.1–8.1)

## 2016-02-06 LAB — TSH: TSH: 1.34 m[IU]/L (ref 0.40–4.50)

## 2016-02-06 NOTE — Assessment & Plan Note (Signed)
Irritable bowel syndrome diarrhea predominant we'll try him on - Viberzi 100mg  BID, samples given

## 2016-02-06 NOTE — Assessment & Plan Note (Signed)
History of anxiety as well as panic attacks. His labs are reviewed including his thyroid and everything was normal. We discussed dietary changes. With regards to his weight. Medical head and increase his propanolol to 40 mg 3 times a day his blood pressure and his heart racing to being to tolerate this.

## 2016-02-06 NOTE — Assessment & Plan Note (Signed)
He continues to benefit from the use of his C Pap I recommend that he continue to use this on a regular basis. This is lowering his risk factors of heart disease or lung disease

## 2016-02-06 NOTE — Progress Notes (Signed)
Patient ID: Francis [name], male   DOB: 05-23-1971, 45 y.o.   MRN: [id number]   Subjective:    Patient ID: Francis [name], male    DOB: 22-Jun-1971, 45 y.o.   MRN: [id number]  Patient presents for CPAP and Tachycardia  Patient here for follow-up. He is now completed treatment for his hepatitis C he did not have any significant side effects with this treatment regimen and has been told that he is cured.  Effects of sleep apnea he is using his CPAP on a regular basis. I keep benefit significantly from this. His usage report shows greater than 80% usage of greater than 4 hours and he uses at 93% of the time. He is due for recertification of his sleep apnea.  He is currently on propanolol was helps his anxiety prevents him from having seizure-like episodes. He has not had a seizure in the past 18 months. At times he feels that work, not he starts to get anxious and feels his heart beating fast. He wanted to know if his propanolol could be increased. Of note he has gained at least 20 pounds over the past year.  He is history of ear bowel syndrome he's been having chronic diarrhea for quite some time. He was also seen by gastroenterology in [place] into a colonoscopy with biopsies everything was benign. He is still using Imodium in order to make it through his day he usually takes up to 8 tablets in a day   Review Of Systems:  GEN- denies fatigue, fever, weight loss,weakness, recent illness HEENT- denies eye drainage, change in vision, nasal discharge, CVS- denies chest pain,+ palpitations RESP- denies SOB, cough, wheeze ABD- denies N/V,+ change in stools, abd pain GU- denies dysuria, hematuria, dribbling, incontinence MSK- denies joint pain, muscle aches, injury Neuro- denies headache, dizziness, syncope, seizure activity       Objective:    BP 130/72 mmHg  Pulse 78  Temp(Src) 98.6 F (37 C) (Oral)  Resp 16  Ht 5\' 9"  (1.753 m)  Wt 196 lb (88.905 kg)  BMI 28.93 kg/m2 GEN- NAD, alert  and oriented x3 HEENT- PERRL, EOMI, non injected sclera, pink conjunctiva, MMM, oropharynx clear Neck- Supple, no thyromegaly CVS- RRR, no murmur RESP-CTAB ABD-NABS,soft,NT,ND Psych- normal affect and mood  EXT- No edema Pulses- Radial - 2+        Assessment & Plan:      Problem List Items Addressed This Visit    Sleep apnea    He continues to benefit from the use of his C Pap I recommend that he continue to use this on a regular basis. This is lowering his risk factors of heart disease or lung disease      IBS (irritable bowel syndrome)    Irritable bowel syndrome diarrhea predominant we'll try him on - Viberzi 100mg  BID, samples given      GERD (gastroesophageal reflux disease)   GAD (generalized anxiety disorder)    History of anxiety as well as panic attacks. His labs are reviewed including his thyroid and everything was normal. We discussed dietary changes. With regards to his weight. Medical head and increase his propanolol to 40 mg 3 times a day his blood pressure and his heart racing to being to tolerate this.       Other Visit Diagnoses    Weight gain    -  Primary    Relevant Orders    CBC with Differential/Platelet (Completed)    Comprehensive metabolic panel (Completed)  TSH (Completed)       Note: This dictation was prepared with Dragon dictation along with smaller phrase technology. Any transcriptional errors that result from this process are unintentional.

## 2016-02-07 ENCOUNTER — Other Ambulatory Visit: Payer: Self-pay | Admitting: *Deleted

## 2016-02-07 MED ORDER — ELUXADOLINE 100 MG PO TABS: 1.0000 | ORAL_TABLET | Freq: Two times a day (BID) | ORAL | Status: DC

## 2016-02-07 MED ORDER — PROPRANOLOL HCL 40 MG PO TABS: 40.0000 mg | ORAL_TABLET | Freq: Three times a day (TID) | ORAL | Status: DC

## 2016-02-09 ENCOUNTER — Telehealth: Payer: Self-pay | Admitting: *Deleted

## 2016-02-09 NOTE — Telephone Encounter (Signed)
Received request from pharmacy for [place] on [place].   PA submitted.   Dx: K58.0- IBS-D

## 2016-02-12 NOTE — Telephone Encounter (Signed)
Received PA determination.   PA approved through 10/27/2016.  Ref# [id number].  Pharmacy made aware.

## 2016-03-08 ENCOUNTER — Ambulatory Visit: Payer: Self-pay | Admitting: Family Medicine

## 2016-04-17 DIAGNOSIS — Z91030 Bee allergy status: Secondary | ICD-10-CM | POA: Diagnosis not present

## 2016-04-17 DIAGNOSIS — T63441A Toxic effect of venom of bees, accidental (unintentional), initial encounter: Secondary | ICD-10-CM | POA: Diagnosis not present

## 2016-04-17 DIAGNOSIS — F17210 Nicotine dependence, cigarettes, uncomplicated: Secondary | ICD-10-CM | POA: Diagnosis not present

## 2016-04-17 DIAGNOSIS — T63444A Toxic effect of venom of bees, undetermined, initial encounter: Secondary | ICD-10-CM | POA: Diagnosis not present

## 2016-04-17 DIAGNOSIS — T7840XA Allergy, unspecified, initial encounter: Secondary | ICD-10-CM | POA: Diagnosis not present

## 2016-07-30 DIAGNOSIS — Z23 Encounter for immunization: Secondary | ICD-10-CM | POA: Diagnosis not present

## 2016-08-09 ENCOUNTER — Encounter: Payer: Self-pay | Admitting: Family Medicine

## 2016-08-14 ENCOUNTER — Ambulatory Visit (INDEPENDENT_AMBULATORY_CARE_PROVIDER_SITE_OTHER): Payer: Medicare Other | Admitting: Family Medicine

## 2016-08-14 ENCOUNTER — Encounter: Payer: Self-pay | Admitting: Family Medicine

## 2016-08-14 VITALS — BP 124/68 | HR 82 | Temp 98.2°F | Resp 14 | Ht 69.0 in | Wt 196.0 lb

## 2016-08-14 DIAGNOSIS — R152 Fecal urgency: Secondary | ICD-10-CM | POA: Diagnosis not present

## 2016-08-14 DIAGNOSIS — F411 Generalized anxiety disorder: Secondary | ICD-10-CM | POA: Diagnosis not present

## 2016-08-14 DIAGNOSIS — K580 Irritable bowel syndrome with diarrhea: Secondary | ICD-10-CM | POA: Diagnosis not present

## 2016-08-14 MED ORDER — DICYCLOMINE HCL 20 MG PO TABS: 20.0000 mg | ORAL_TABLET | Freq: Three times a day (TID) | ORAL | 3 refills | Status: DC

## 2016-08-14 MED ORDER — ELUXADOLINE 100 MG PO TABS: 1.0000 | ORAL_TABLET | Freq: Two times a day (BID) | ORAL | 3 refills | Status: DC

## 2016-08-14 MED ORDER — FLUOXETINE HCL 10 MG PO CAPS: 10.0000 mg | ORAL_CAPSULE | Freq: Every day | ORAL | 3 refills | Status: DC

## 2016-08-14 MED ORDER — OMEPRAZOLE 40 MG PO CPDR: 40.0000 mg | DELAYED_RELEASE_CAPSULE | Freq: Every day | ORAL | 6 refills | Status: DC

## 2016-08-14 NOTE — Assessment & Plan Note (Signed)
IBS predominately with diarrhea but also with this fecal rectal urgency. I think that there is a component of anxiety as it occurs much more often when he is out in public and he does well when he is within his home. May try him on Bentyl especially when he is out in public he will continue the Viberzi We did discuss getting a  second opinion he would like to try medication first.  With regards to his anxiety he has known mid disorder panic attacks he is willing to try a low-dose of Prozac he has been on this in the past did not have any adverse reactions.
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
## 2016-08-14 NOTE — Assessment & Plan Note (Signed)
Per above trial of prozac F/u 4 weeks

## 2016-08-14 NOTE — Patient Instructions (Addendum)
F/u 4 WEEKS Try the prozac  Take the bentyl medication Continue the viberzi

## 2016-09-11 ENCOUNTER — Other Ambulatory Visit: Payer: Self-pay | Admitting: Family Medicine

## 2016-09-11 ENCOUNTER — Ambulatory Visit: Payer: Medicare Other | Admitting: Family Medicine

## 2016-09-12 NOTE — Telephone Encounter (Signed)
Ok to refill 

## 2016-09-13 NOTE — Telephone Encounter (Signed)
Medication called to pharmacy. 

## 2016-09-13 NOTE — Telephone Encounter (Signed)
okay

## 2016-10-09 ENCOUNTER — Encounter: Payer: Self-pay | Admitting: Family Medicine

## 2016-10-09 ENCOUNTER — Ambulatory Visit (INDEPENDENT_AMBULATORY_CARE_PROVIDER_SITE_OTHER): Payer: Medicare Other | Admitting: Family Medicine

## 2016-10-09 VITALS — BP 122/62 | HR 74 | Temp 98.0°F | Resp 14 | Ht 69.0 in | Wt 199.0 lb

## 2016-10-09 DIAGNOSIS — L723 Sebaceous cyst: Secondary | ICD-10-CM

## 2016-10-09 DIAGNOSIS — L0201 Cutaneous abscess of face: Secondary | ICD-10-CM

## 2016-10-09 DIAGNOSIS — IMO0002 Reserved for concepts with insufficient information to code with codable children: Secondary | ICD-10-CM

## 2016-10-09 MED ORDER — DOXYCYCLINE HYCLATE 100 MG PO TABS: 100.0000 mg | ORAL_TABLET | Freq: Two times a day (BID) | ORAL | 0 refills | Status: DC

## 2016-10-09 NOTE — Patient Instructions (Signed)
Start antibiotics as prescribed Use compress We will call in 1 week  F/U pending treatment

## 2016-10-09 NOTE — Progress Notes (Signed)
   Subjective:    Patient ID: Francis [name], male    DOB: October 19, 1971, 45 y.o.   MRN: [id number]  Patient presents for Hard Knot to Back of L Ear (reports abscess like area that is tracking down from ear to neck)  Patient here or with not on his left ear that has been present for about a month. Initially was a small abscess-looking lesion he he did squeeze on it there was some clear fluid now it is tracking down his neck. He does not have any pain no fever no chills occasionally the area itches. He also has a bump on his right side of his face near his nose that popped up a few weeks after the one behind his ear.that one has been more tender   Meds reviewed bowels controlled with viberzi and bentyl  Review Of Systems:  GEN- denies fatigue, fever, weight loss,weakness, recent illness HEENT- denies eye drainage, change in vision, nasal discharge, CVS- denies chest pain, palpitations RESP- denies SOB, cough, wheeze ABD- denies N/V, change in stools, abd pain Neuro- denies headache, dizziness, syncope, seizure activity       Objective:    BP 122/62 (BP Location: Right Arm, Patient Position: Sitting, Cuff Size: Large)   Pulse 74   Temp 98 F (36.7 C) (Oral)   Resp 14   Ht 5\' 9"  (1.753 m)   Wt 199 lb (90.3 kg)   SpO2 98%   BMI 29.39 kg/m  GEN- NAD, alert and oriented x3 HEENT- PERRL, EOMI, non injected sclera, pink conjunctiva, MMM, oropharynx clear, TM clear bilat - right to nose erythematous indurated papule, no fluctuance, TTP, behind left ear- fluctuant cystic like lesion with liner tracker to jawline, NT, no erythema  Neck- Supple, shotty cervical LAD left side  Pulses- Radial 2+        Assessment & Plan:      Problem List Items Addressed This Visit    None    Visit Diagnoses    Abscess of face    -  Primary   appears to have small abscess on face, will not I and D based on location, compresses, antibotics    Cyst neck       Concern this is either loculated  infection or cyst with that is tracking down neck. Start doxy, if no improvement in 1 week obtain Ultrasound of neck to evaluate. Interestingly not tender or red, no warmth       Note: This dictation was prepared with Dragon dictation along with smaller phrase technology. Any transcriptional errors that result from this process are unintentional.

## 2016-10-15 ENCOUNTER — Telehealth: Payer: Self-pay | Admitting: *Deleted

## 2016-10-15 DIAGNOSIS — IMO0002 Reserved for concepts with insufficient information to code with codable children: Secondary | ICD-10-CM

## 2016-10-15 NOTE — Telephone Encounter (Signed)
Order US soft tissue of neck

## 2016-10-15 NOTE — Telephone Encounter (Signed)
Received call from patient SO.   Reports that cyst like area to neck has not improved with ABTx.   Per chart notes, [place] will be performed if area does not respond to ABTx. Ok to order [place]?

## 2016-10-15 NOTE — Telephone Encounter (Signed)
Call placed to patient and patient made aware.   [place] ordered.

## 2016-10-18 ENCOUNTER — Ambulatory Visit (HOSPITAL_COMMUNITY)
Admission: RE | Admit: 2016-10-18 | Discharge: 2016-10-18 | Disposition: A | Discharge status: Home / Self Care | Payer: Medicare Other | Source: Ambulatory Visit | Attending: Family Medicine | Admitting: Family Medicine

## 2016-10-18 DIAGNOSIS — IMO0002 Reserved for concepts with insufficient information to code with codable children: Secondary | ICD-10-CM

## 2016-10-18 DIAGNOSIS — H6002 Abscess of left external ear: Secondary | ICD-10-CM | POA: Diagnosis not present

## 2016-10-23 ENCOUNTER — Other Ambulatory Visit: Payer: Self-pay | Admitting: Family Medicine

## 2016-10-23 ENCOUNTER — Telehealth: Payer: Self-pay

## 2016-10-23 DIAGNOSIS — I809 Phlebitis and thrombophlebitis of unspecified site: Secondary | ICD-10-CM | POA: Diagnosis not present

## 2016-10-23 DIAGNOSIS — R221 Localized swelling, mass and lump, neck: Secondary | ICD-10-CM | POA: Diagnosis not present

## 2016-10-23 DIAGNOSIS — L0211 Cutaneous abscess of neck: Secondary | ICD-10-CM

## 2016-10-23 NOTE — Telephone Encounter (Signed)
I spoke with patient he is aware that he is scheduled for an urgent referral to ENT. He is scheduled for today at 3:00 arrival time is 2:45. He is going to see [facility] ENT he is seeing Dr. Ward Givens.

## 2016-10-23 NOTE — Telephone Encounter (Signed)
Called patient 2 times. Went straight to vmail. No message left.

## 2016-10-23 NOTE — Progress Notes (Signed)
ENT will see patient this afternoon at 3:00 he is aware of the appt.

## 2016-10-23 NOTE — Telephone Encounter (Signed)
-----   Message from Alycia Rossetti, MD sent at 10/22/2016  8:23 PM EST ----- Call pt he has what looks like a complex abscess vs cyst, I am going to send him to ENT or general surgery we will see which one specializes in tis , I think he will need surgical removal of this since it did not respond to antibiotics. Send urgent referral- he has ?abscess  Or cyst of neck- it starts post auricular (ear) not sure if ENT or surgery can evaluate this. Send [place] report, he was on antibiotics, it has been present for a month

## 2016-10-23 NOTE — Progress Notes (Signed)
ENT referral

## 2016-10-24 NOTE — Telephone Encounter (Signed)
Noted  

## 2016-12-24 ENCOUNTER — Other Ambulatory Visit: Payer: Self-pay | Admitting: Family Medicine

## 2016-12-31 ENCOUNTER — Telehealth: Payer: Self-pay | Admitting: *Deleted

## 2016-12-31 NOTE — Telephone Encounter (Signed)
Received request from pharmacy for [place] on [address].   PA submitted.   Dx: K58.0- IBSD.  Received PA determination.   PA approved through 10/27/2017.  [id number].   Pharmacy made aware.

## 2017-01-01 ENCOUNTER — Telehealth: Payer: Self-pay | Admitting: Family Medicine

## 2017-01-01 NOTE — Telephone Encounter (Signed)
Patient called states he is completely out of his Viberzi, he states that [place] needs to be called at [phone] that they do need a PA for this medication. He states that he received a call stating everything had been taken care of however when he got to the pharmacy they told him It was over $500. Please contact patient once this has been taken care of. I told him that I saw the PA was already done for this prescription but he states he needs [place] to call [facility] since he can't afford over $500.   CB# [phone]

## 2017-01-02 NOTE — Telephone Encounter (Signed)
Call placed to patient insurance.   PA has been approved, but patient has $405 prescription deductible that has to be met.   Patient states that he cannot afford this and is requesting alternative.   MD please advise.

## 2017-01-03 MED ORDER — DICYCLOMINE HCL 20 MG PO TABS: ORAL_TABLET | ORAL | 0 refills | Status: DC

## 2017-01-03 NOTE — Telephone Encounter (Signed)
There is no other medication exactly like the viberzi He can try bentyl 20mg  TID with meals to see if this helps, this is anti-spasmodic

## 2017-01-03 NOTE — Telephone Encounter (Signed)
Call placed to patient.   States that he has been taking Benytl TID. States that it seems to work as long as he is taking Social worker.   Will attempt Tier Exception.

## 2017-01-08 NOTE — Telephone Encounter (Signed)
Tier exception denied.   Patient will continue Bentyl.

## 2017-01-24 ENCOUNTER — Other Ambulatory Visit: Payer: Self-pay | Admitting: Family Medicine

## 2017-02-03 ENCOUNTER — Encounter: Payer: Self-pay | Admitting: Family Medicine

## 2017-02-09 ENCOUNTER — Other Ambulatory Visit: Payer: Self-pay | Admitting: Family Medicine

## 2017-02-14 ENCOUNTER — Other Ambulatory Visit: Payer: Self-pay | Admitting: Family Medicine

## 2017-03-03 ENCOUNTER — Encounter: Payer: Self-pay | Admitting: Family Medicine

## 2017-03-11 ENCOUNTER — Other Ambulatory Visit: Payer: Self-pay | Admitting: Family Medicine

## 2017-03-17 ENCOUNTER — Other Ambulatory Visit: Payer: Self-pay | Admitting: Family Medicine

## 2017-04-02 DIAGNOSIS — J069 Acute upper respiratory infection, unspecified: Secondary | ICD-10-CM | POA: Diagnosis not present

## 2017-04-02 DIAGNOSIS — M79604 Pain in right leg: Secondary | ICD-10-CM | POA: Diagnosis not present

## 2017-04-02 DIAGNOSIS — R1031 Right lower quadrant pain: Secondary | ICD-10-CM | POA: Diagnosis not present

## 2017-04-02 DIAGNOSIS — Z8619 Personal history of other infectious and parasitic diseases: Secondary | ICD-10-CM | POA: Diagnosis not present

## 2017-04-02 DIAGNOSIS — M79661 Pain in right lower leg: Secondary | ICD-10-CM | POA: Diagnosis not present

## 2017-04-02 DIAGNOSIS — M545 Low back pain: Secondary | ICD-10-CM | POA: Diagnosis not present

## 2017-04-02 DIAGNOSIS — F17210 Nicotine dependence, cigarettes, uncomplicated: Secondary | ICD-10-CM | POA: Diagnosis not present

## 2017-04-02 DIAGNOSIS — G40909 Epilepsy, unspecified, not intractable, without status epilepticus: Secondary | ICD-10-CM | POA: Diagnosis not present

## 2017-04-02 DIAGNOSIS — Z91030 Bee allergy status: Secondary | ICD-10-CM | POA: Diagnosis not present

## 2017-04-08 ENCOUNTER — Other Ambulatory Visit: Payer: Self-pay | Admitting: Family Medicine

## 2017-04-19 ENCOUNTER — Other Ambulatory Visit: Payer: Self-pay | Admitting: Family Medicine

## 2017-05-05 ENCOUNTER — Other Ambulatory Visit: Payer: Self-pay | Admitting: Family Medicine

## 2017-05-14 ENCOUNTER — Encounter: Payer: Self-pay | Admitting: Family Medicine

## 2017-06-03 ENCOUNTER — Other Ambulatory Visit: Payer: Self-pay | Admitting: Family Medicine

## 2017-06-28 ENCOUNTER — Other Ambulatory Visit: Payer: Self-pay | Admitting: Family Medicine

## 2017-08-01 ENCOUNTER — Ambulatory Visit (INDEPENDENT_AMBULATORY_CARE_PROVIDER_SITE_OTHER): Payer: Medicare Other | Admitting: Family Medicine

## 2017-08-01 ENCOUNTER — Encounter: Payer: Self-pay | Admitting: Family Medicine

## 2017-08-01 VITALS — BP 110/64 | HR 100 | Temp 98.8°F | Resp 16 | Ht 69.0 in | Wt 194.0 lb

## 2017-08-01 DIAGNOSIS — Z23 Encounter for immunization: Secondary | ICD-10-CM

## 2017-08-01 DIAGNOSIS — F411 Generalized anxiety disorder: Secondary | ICD-10-CM

## 2017-08-01 DIAGNOSIS — K580 Irritable bowel syndrome with diarrhea: Secondary | ICD-10-CM | POA: Diagnosis not present

## 2017-08-01 DIAGNOSIS — K219 Gastro-esophageal reflux disease without esophagitis: Secondary | ICD-10-CM

## 2017-08-01 DIAGNOSIS — F410 Panic disorder [episodic paroxysmal anxiety] without agoraphobia: Secondary | ICD-10-CM

## 2017-08-01 MED ORDER — OMEPRAZOLE 40 MG PO CPDR: DELAYED_RELEASE_CAPSULE | ORAL | 2 refills | Status: DC

## 2017-08-01 MED ORDER — PROPRANOLOL HCL 20 MG PO TABS
20.0000 mg | ORAL_TABLET | Freq: Three times a day (TID) | ORAL | 2 refills | Status: DC
Start: 2017-08-01 — End: 2017-09-17

## 2017-08-01 MED ORDER — DICYCLOMINE HCL 20 MG PO TABS: ORAL_TABLET | ORAL | 6 refills | Status: DC

## 2017-08-01 NOTE — Patient Instructions (Addendum)
Schedule Physical with fasting labs   Propranolol three times a day  Flu shot

## 2017-08-01 NOTE — Assessment & Plan Note (Signed)
Restart propranolol at 20mg  TID  Recheck at physical to see if he needs to go back up to the 40mg 

## 2017-08-01 NOTE — Assessment & Plan Note (Signed)
Continue omeprazole 

## 2017-08-01 NOTE — Assessment & Plan Note (Signed)
Continue bentyl.  

## 2017-08-01 NOTE — Progress Notes (Signed)
   Subjective:    Patient ID: Francis [name], male    DOB: 01-20-1971, 46 y.o.   MRN: [id number]  Patient presents for Medication Management; Medication Refill; and Flu Vaccine  Patient here to follow-up his medications. He has history of generalized anxiety with panic attacks he has been on propanolol which is helped with his anxiety and his tachycardia. He's been out of this medication for at least 6 months states that he stopped because his partner stated he didn't need it he continues to have episodes without it. He would like to restart. Next  Irritable Bowel syndrome diarrhea mostly. He's been on dicyclomine and he also uses over-the-counter Imodium as needed. He needs a refill on the dicyclomine. He also has reflux and takes omeprazole  Due for flu shot Due for Physical and fasting labs - He will return for this   Review Of Systems:  GEN- denies fatigue, fever, weight loss,weakness, recent illness HEENT- denies eye drainage, change in vision, nasal discharge, CVS- denies chest pain, palpitations RESP- denies SOB, cough, wheeze ABD- denies N/V, change in stools, abd pain GU- denies dysuria, hematuria, dribbling, incontinence MSK- denies joint pain, muscle aches, injury Neuro- denies headache, dizziness, syncope, seizure activity       Objective:    BP 110/64   Pulse 100   Temp 98.8 F (37.1 C) (Oral)   Resp 16   Ht 5\' 9"  (1.753 m)   Wt 194 lb (88 kg)   SpO2 98%   BMI 28.65 kg/m  GEN- NAD, alert and oriented x3 HEENT- PERRL, EOMI, non injected sclera, pink conjunctiva, MMM, oropharynx clear Neck- Supple, no thyromegaly CVS- Tachycardiac HR 100, no murmur RESP-CTAB ABD-NABS,soft,NT,ND Psych- normal affect and mood EXT- No edema Pulses- Radial 2+        Assessment & Plan:      Problem List Items Addressed This Visit      Unprioritized   Panic attacks   IBS (irritable bowel syndrome)    Continue bentyl      Relevant Medications   dicyclomine (BENTYL)  20 MG tablet   omeprazole (PRILOSEC) 40 MG capsule   GERD (gastroesophageal reflux disease)    Continue omeprazole       Relevant Medications   dicyclomine (BENTYL) 20 MG tablet   omeprazole (PRILOSEC) 40 MG capsule   GAD (generalized anxiety disorder) - Primary    Restart propranolol at 20mg  TID  Recheck at physical to see if he needs to go back up to the 40mg           Note: This dictation was prepared with Dragon dictation along with smaller phrase technology. Any transcriptional errors that result from this process are unintentional.

## 2017-08-28 ENCOUNTER — Encounter (HOSPITAL_COMMUNITY): Payer: Self-pay

## 2017-08-28 ENCOUNTER — Emergency Department (HOSPITAL_COMMUNITY)
Admission: EM | Admit: 2017-08-28 | Discharge: 2017-08-28 | Disposition: A | Discharge status: Home / Self Care | Payer: Medicare Other | Attending: Emergency Medicine | Admitting: Emergency Medicine

## 2017-08-28 DIAGNOSIS — R5383 Other fatigue: Secondary | ICD-10-CM | POA: Diagnosis not present

## 2017-08-28 DIAGNOSIS — Z79899 Other long term (current) drug therapy: Secondary | ICD-10-CM | POA: Insufficient documentation

## 2017-08-28 DIAGNOSIS — F17210 Nicotine dependence, cigarettes, uncomplicated: Secondary | ICD-10-CM | POA: Diagnosis not present

## 2017-08-28 LAB — COMPREHENSIVE METABOLIC PANEL
ALBUMIN: 4.3 g/dL (ref 3.5–5.0)
ALT: 11 U/L — ABNORMAL LOW (ref 17–63)
ANION GAP: 10 (ref 5–15)
AST: 15 U/L (ref 15–41)
Alkaline Phosphatase: 67 U/L (ref 38–126)
BUN: 21 mg/dL — AB (ref 6–20)
CHLORIDE: 104 mmol/L (ref 101–111)
CO2: 22 mmol/L (ref 22–32)
Calcium: 9.1 mg/dL (ref 8.9–10.3)
Creatinine, Ser: 1.1 mg/dL (ref 0.61–1.24)
GFR calc Af Amer: >60 mL/min (ref >60)
GFR calc non Af Amer: >60 mL/min (ref >60)
GLUCOSE: 87 mg/dL (ref 65–99)
POTASSIUM: 3.9 mmol/L (ref 3.5–5.1)
SODIUM: 136 mmol/L (ref 135–145)
TOTAL PROTEIN: 6.9 g/dL (ref 6.5–8.1)
Total Bilirubin: 1.1 mg/dL (ref 0.3–1.2)

## 2017-08-28 LAB — CBC
HEMATOCRIT: 42.7 % (ref 39.0–52.0)
Hemoglobin: 14.1 g/dL (ref 13.0–17.0)
MCH: 27.9 pg (ref 26.0–34.0)
MCHC: 33 g/dL (ref 30.0–36.0)
MCV: 84.6 fL (ref 78.0–100.0)
Platelets: 142 10*3/uL — ABNORMAL LOW (ref 150–400)
RBC: 5.05 MIL/uL (ref 4.22–5.81)
RDW: 13.9 % (ref 11.5–15.5)
WBC: 5.1 10*3/uL (ref 4.0–10.5)

## 2017-08-28 LAB — URINALYSIS, ROUTINE W REFLEX MICROSCOPIC
BILIRUBIN URINE: NEGATIVE
Glucose, UA: NEGATIVE mg/dL
Hgb urine dipstick: NEGATIVE
KETONES UR: NEGATIVE mg/dL
LEUKOCYTES UA: NEGATIVE
NITRITE: NEGATIVE
PH: 5 (ref 5.0–8.0)
PROTEIN: NEGATIVE mg/dL
Specific Gravity, Urine: 1.015 (ref 1.005–1.030)

## 2017-08-28 LAB — RAPID HIV SCREEN (HIV 1/2 AB+AG)
HIV 1/2 ANTIBODIES: NONREACTIVE
HIV-1 P24 ANTIGEN - HIV24: NONREACTIVE

## 2017-08-28 LAB — TSH: TSH: 1.427 u[IU]/mL (ref 0.350–4.500)

## 2017-08-28 NOTE — ED Triage Notes (Signed)
Patient reports of hx of hepatitis C and completed Epclusa and now having fatigue, nausea and weight loss like previously when he had hep c. Patient completed treatment 1 year ago did not go to check up.

## 2017-08-28 NOTE — Discharge Instructions (Signed)
Your testing was normal - no signs of abnormal liver or kidney function - normal blood counts and thyroid test was normal HIV was NOT detected.
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
## 2017-09-17 ENCOUNTER — Encounter (HOSPITAL_COMMUNITY): Payer: Self-pay | Admitting: Emergency Medicine

## 2017-09-17 ENCOUNTER — Emergency Department (HOSPITAL_COMMUNITY): Payer: Medicare Other

## 2017-09-17 ENCOUNTER — Emergency Department (HOSPITAL_COMMUNITY)
Admission: EM | Admit: 2017-09-17 | Discharge: 2017-09-17 | Disposition: A | Discharge status: Home / Self Care | Payer: Medicare Other | Attending: Emergency Medicine | Admitting: Emergency Medicine

## 2017-09-17 DIAGNOSIS — F4010 Social phobia, unspecified: Secondary | ICD-10-CM | POA: Insufficient documentation

## 2017-09-17 DIAGNOSIS — R110 Nausea: Secondary | ICD-10-CM | POA: Diagnosis not present

## 2017-09-17 DIAGNOSIS — R5383 Other fatigue: Secondary | ICD-10-CM

## 2017-09-17 DIAGNOSIS — F17210 Nicotine dependence, cigarettes, uncomplicated: Secondary | ICD-10-CM | POA: Insufficient documentation

## 2017-09-17 DIAGNOSIS — Z79899 Other long term (current) drug therapy: Secondary | ICD-10-CM | POA: Insufficient documentation

## 2017-09-17 DIAGNOSIS — F419 Anxiety disorder, unspecified: Secondary | ICD-10-CM | POA: Diagnosis not present

## 2017-09-17 DIAGNOSIS — R0602 Shortness of breath: Secondary | ICD-10-CM | POA: Diagnosis not present

## 2017-09-17 LAB — URINALYSIS, ROUTINE W REFLEX MICROSCOPIC
BILIRUBIN URINE: NEGATIVE
GLUCOSE, UA: NEGATIVE mg/dL
HGB URINE DIPSTICK: NEGATIVE
Ketones, ur: NEGATIVE mg/dL
Leukocytes, UA: NEGATIVE
Nitrite: NEGATIVE
Protein, ur: NEGATIVE mg/dL
SPECIFIC GRAVITY, URINE: 1.024 (ref 1.005–1.030)
pH: 5 (ref 5.0–8.0)

## 2017-09-17 LAB — COMPREHENSIVE METABOLIC PANEL
ALBUMIN: 4.9 g/dL (ref 3.5–5.0)
ALT: 19 U/L (ref 17–63)
AST: 19 U/L (ref 15–41)
Alkaline Phosphatase: 81 U/L (ref 38–126)
Anion gap: 9 (ref 5–15)
BUN: 30 mg/dL — AB (ref 6–20)
CHLORIDE: 107 mmol/L (ref 101–111)
CO2: 23 mmol/L (ref 22–32)
CREATININE: 1.09 mg/dL (ref 0.61–1.24)
Calcium: 9.8 mg/dL (ref 8.9–10.3)
GFR calc Af Amer: >60 mL/min (ref >60)
Glucose, Bld: 110 mg/dL — ABNORMAL HIGH (ref 65–99)
POTASSIUM: 3.7 mmol/L (ref 3.5–5.1)
SODIUM: 139 mmol/L (ref 135–145)
Total Bilirubin: 0.6 mg/dL (ref 0.3–1.2)
Total Protein: 8.3 g/dL — ABNORMAL HIGH (ref 6.5–8.1)

## 2017-09-17 LAB — CBC WITH DIFFERENTIAL/PLATELET
Basophils Absolute: 0 10*3/uL (ref 0.0–0.1)
Basophils Relative: 0 %
EOS ABS: 0.1 10*3/uL (ref 0.0–0.7)
EOS PCT: 2 %
HCT: 46.2 % (ref 39.0–52.0)
HEMOGLOBIN: 15 g/dL (ref 13.0–17.0)
LYMPHS ABS: 1.9 10*3/uL (ref 0.7–4.0)
LYMPHS PCT: 25 %
MCH: 28.2 pg (ref 26.0–34.0)
MCHC: 32.5 g/dL (ref 30.0–36.0)
MCV: 86.8 fL (ref 78.0–100.0)
MONOS PCT: 7 %
Monocytes Absolute: 0.5 10*3/uL (ref 0.1–1.0)
Neutro Abs: 5 10*3/uL (ref 1.7–7.7)
Neutrophils Relative %: 66 %
PLATELETS: 188 10*3/uL (ref 150–400)
RBC: 5.32 MIL/uL (ref 4.22–5.81)
RDW: 14.1 % (ref 11.5–15.5)
WBC: 7.5 10*3/uL (ref 4.0–10.5)

## 2017-09-17 MED ORDER — ONDANSETRON 8 MG PO TBDP
8.0000 mg | ORAL_TABLET | Freq: Once | ORAL | Status: AC
  Administered 2017-09-17: 8 mg via ORAL
  Filled 2017-09-17: qty 1

## 2017-09-17 MED ORDER — ONDANSETRON HCL 4 MG/2ML IJ SOLN
4.0000 mg | Freq: Once | INTRAMUSCULAR | Status: DC
Start: 2017-09-17 — End: 2017-09-17

## 2017-09-17 MED ORDER — SODIUM CHLORIDE 0.9 % IV BOLUS (SEPSIS): 1000.0000 mL | Freq: Once | INTRAVENOUS | Status: DC

## 2017-09-17 NOTE — Discharge Instructions (Signed)
Your blood work does suggest that she might be dehydrated.  Please make sure to drink plenty of fluids.  Please make an appointment with 1 of the counseling services.  I believe most of your symptoms are related to underlying anxiety disorder and bipolar disorder.

## 2017-09-17 NOTE — ED Notes (Signed)
Patient transported to X-ray 

## 2017-09-17 NOTE — ED Notes (Addendum)
Pt left saying he didn't want to wait for his discharge papers. Refused letting me get vital signs. MD notified.

## 2017-09-17 NOTE — ED Triage Notes (Signed)
Patient complains of fatigue, nausea, chills x 4 days.
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
## 2017-11-07 ENCOUNTER — Encounter: Payer: Medicare Other | Admitting: Family Medicine

## 2018-01-28 ENCOUNTER — Other Ambulatory Visit: Payer: Self-pay | Admitting: Family Medicine

## 2018-05-06 ENCOUNTER — Other Ambulatory Visit: Payer: Self-pay | Admitting: Family Medicine

## 2018-06-08 ENCOUNTER — Other Ambulatory Visit: Payer: Self-pay | Admitting: Family Medicine

## 2018-07-09 ENCOUNTER — Other Ambulatory Visit: Payer: Self-pay | Admitting: Family Medicine

## 2018-07-09 NOTE — Telephone Encounter (Signed)
Medication filled x1 with no refills.   Requires office visit before any further refills can be given.   Letter sent.  

## 2018-09-10 ENCOUNTER — Other Ambulatory Visit: Payer: Self-pay | Admitting: Family Medicine

## 2018-09-10 ENCOUNTER — Telehealth: Payer: Self-pay | Admitting: Family Medicine

## 2018-09-10 ENCOUNTER — Other Ambulatory Visit: Payer: Medicare Other

## 2018-09-10 DIAGNOSIS — Z0000 Encounter for general adult medical examination without abnormal findings: Secondary | ICD-10-CM | POA: Diagnosis not present

## 2018-09-10 DIAGNOSIS — Z13220 Encounter for screening for lipoid disorders: Secondary | ICD-10-CM

## 2018-09-10 DIAGNOSIS — Z13818 Encounter for screening for other digestive system disorders: Secondary | ICD-10-CM | POA: Diagnosis not present

## 2018-09-10 DIAGNOSIS — Z136 Encounter for screening for cardiovascular disorders: Secondary | ICD-10-CM

## 2018-09-10 LAB — COMPLETE METABOLIC PANEL WITH GFR
AG Ratio: 1.9 (calc) (ref 1.0–2.5)
ALT: 14 U/L (ref 9–46)
AST: 16 U/L (ref 10–40)
Albumin: 4.5 g/dL (ref 3.6–5.1)
Alkaline phosphatase (APISO): 68 U/L (ref 40–115)
BUN: 22 mg/dL (ref 7–25)
CALCIUM: 9.5 mg/dL (ref 8.6–10.3)
CO2: 24 mmol/L (ref 20–32)
Chloride: 108 mmol/L (ref 98–110)
Creat: 1.16 mg/dL (ref 0.60–1.35)
GFR, EST AFRICAN AMERICAN: 86 mL/min/{1.73_m2} (ref >=60)
GFR, EST NON AFRICAN AMERICAN: 75 mL/min/{1.73_m2} (ref >=60)
GLOBULIN: 2.4 g/dL (ref 1.9–3.7)
GLUCOSE: 97 mg/dL (ref 65–99)
Potassium: 3.8 mmol/L (ref 3.5–5.3)
SODIUM: 143 mmol/L (ref 135–146)
TOTAL PROTEIN: 6.9 g/dL (ref 6.1–8.1)
Total Bilirubin: 0.6 mg/dL (ref 0.2–1.2)

## 2018-09-10 LAB — CBC WITH DIFFERENTIAL/PLATELET
BASOS PCT: 1.3 %
Basophils Absolute: 61 cells/uL (ref 0–200)
EOS ABS: 61 {cells}/uL (ref 15–500)
EOS PCT: 1.3 %
HEMATOCRIT: 41.3 % (ref 38.5–50.0)
HEMOGLOBIN: 13.7 g/dL (ref 13.2–17.1)
Lymphs Abs: 1462 cells/uL (ref 850–3900)
MCH: 27.1 pg (ref 27.0–33.0)
MCHC: 33.2 g/dL (ref 32.0–36.0)
MCV: 81.8 fL (ref 80.0–100.0)
MONOS PCT: 7.4 %
MPV: 12.2 fL (ref 7.5–12.5)
NEUTROS ABS: 2768 {cells}/uL (ref 1500–7800)
Neutrophils Relative %: 58.9 %
Platelets: 173 10*3/uL (ref 140–400)
RBC: 5.05 10*6/uL (ref 4.20–5.80)
RDW: 13 % (ref 11.0–15.0)
Total Lymphocyte: 31.1 %
WBC: 4.7 10*3/uL (ref 3.8–10.8)
WBCMIX: 348 {cells}/uL (ref 200–950)

## 2018-09-10 LAB — LIPID PANEL
CHOL/HDL RATIO: 4 (calc) (ref <5.0)
CHOLESTEROL: 181 mg/dL (ref <200)
HDL: 45 mg/dL (ref >40)
LDL CHOLESTEROL (CALC): 118 mg/dL — AB
NON-HDL CHOLESTEROL (CALC): 136 mg/dL — AB (ref <130)
Triglycerides: 82 mg/dL (ref <150)

## 2018-09-10 MED ORDER — DICYCLOMINE HCL 20 MG PO TABS: 20.0000 mg | ORAL_TABLET | Freq: Three times a day (TID) | ORAL | 0 refills | Status: DC

## 2018-09-10 MED ORDER — OMEPRAZOLE 40 MG PO CPDR: 40.0000 mg | DELAYED_RELEASE_CAPSULE | Freq: Every day | ORAL | 0 refills | Status: DC | PRN

## 2018-09-10 NOTE — Telephone Encounter (Signed)
CB# [phone]  Patient came in for labs today. He was scheduled for Tuesday for a med check and refill however I have made him an appt on 09/23/18 for a CPE since that is what his previous AVS stated. Can we please send in refills to [facility] on [facility] [address] [facility].  He needs all medication.

## 2018-09-10 NOTE — Telephone Encounter (Signed)
Prescription sent to pharmacy.

## 2018-09-15 ENCOUNTER — Ambulatory Visit: Payer: Medicare Other | Admitting: Family Medicine

## 2018-09-23 ENCOUNTER — Ambulatory Visit (INDEPENDENT_AMBULATORY_CARE_PROVIDER_SITE_OTHER): Payer: Medicare Other | Admitting: Family Medicine

## 2018-09-23 ENCOUNTER — Encounter: Payer: Self-pay | Admitting: Family Medicine

## 2018-09-23 ENCOUNTER — Other Ambulatory Visit: Payer: Self-pay

## 2018-09-23 VITALS — BP 128/74 | HR 72 | Temp 98.5°F | Resp 14 | Ht 69.0 in | Wt 192.0 lb

## 2018-09-23 DIAGNOSIS — N3943 Post-void dribbling: Secondary | ICD-10-CM

## 2018-09-23 DIAGNOSIS — Z23 Encounter for immunization: Secondary | ICD-10-CM

## 2018-09-23 DIAGNOSIS — Z8619 Personal history of other infectious and parasitic diseases: Secondary | ICD-10-CM

## 2018-09-23 DIAGNOSIS — Z114 Encounter for screening for human immunodeficiency virus [HIV]: Secondary | ICD-10-CM | POA: Diagnosis not present

## 2018-09-23 DIAGNOSIS — N401 Enlarged prostate with lower urinary tract symptoms: Secondary | ICD-10-CM

## 2018-09-23 DIAGNOSIS — Z0000 Encounter for general adult medical examination without abnormal findings: Secondary | ICD-10-CM

## 2018-09-23 DIAGNOSIS — Z125 Encounter for screening for malignant neoplasm of prostate: Secondary | ICD-10-CM | POA: Diagnosis not present

## 2018-09-23 DIAGNOSIS — N400 Enlarged prostate without lower urinary tract symptoms: Secondary | ICD-10-CM | POA: Insufficient documentation

## 2018-09-23 MED ORDER — TAMSULOSIN HCL 0.4 MG PO CAPS
0.4000 mg | ORAL_CAPSULE | Freq: Every day | ORAL | 3 refills | Status: DC
Start: 2018-09-23 — End: 2019-01-28

## 2018-09-23 NOTE — Progress Notes (Signed)
Subjective:   Patient presents for Medicare Annual/Subsequent preventive examination.   Review Past Medical/Family/Social: Per EMR  Feels good, last visit 1 year ago  He does have a weak urine stream, often dribbles or stands there for a while before urine comes out, he does eventually empty his bladder, this has been going on for months   Works part time He has gone back to smoking, was using a vape His partner Eulas Post is here today   Note he never followed back up with Hep C clinic after his treatment Controlling his IBS with his meds   Risk Factors  Current exercise habits: walks Dietary issues discussed: Yes  Cardiac risk factors: Smoker, family history   Depression Screen  (Note: if answer to either of the following is "Yes", a more complete depression screening is indicated)  Over the past two weeks, have you felt down, depressed or hopeless? No Over the past two weeks, have you felt little interest or pleasure in doing things? No Have you lost interest or pleasure in daily life? No Do you often feel hopeless? No Do you cry easily over simple problems? No   Activities of Daily Living  In your present state of health, do you have any difficulty performing the following activities?:  Driving? No  Managing money? No  Feeding yourself? No  Getting from bed to chair? No  Climbing a flight of stairs? No  Preparing food and eating?: No  Bathing or showering? No  Getting dressed: No  Getting to the toilet? No  Using the toilet:No  Moving around from place to place: No  In the past year have you fallen or had a near fall?:No  Are you sexually active? Yes Do you have more than one partner? No   Hearing Difficulties: No  Do you often ask people to speak up or repeat themselves? No  Do you experience ringing or noises in your ears? No Do you have difficulty understanding soft or whispered voices? No  Do you feel that you have a problem with memory? No Do you often  misplace items? No  Do you feel safe at home? Yes  Cognitive Testing  Alert? Yes Normal Appearance?Yes  Oriented to person? Yes Place? Yes  Time? Yes  Recall of three objects? Yes  Can perform simple calculations? Yes  Displays appropriate judgment?Yes  Can read the correct time from a watch face?Yes   List the Names of Other Physician/Practitioners you currently use:   NONE  Screening Tests / Date Colonoscopy    AGE 47                  Influenza Vaccine UTD Tetanus/tdap Due HEP B vaccine UTD  ROS: GEN- denies fatigue, fever, weight loss,weakness, recent illness HEENT- denies eye drainage, change in vision, nasal discharge, CVS- denies chest pain, palpitations RESP- denies SOB, cough, wheeze ABD- denies N/V, change in stools, abd pain GU- denies dysuria, hematuria, dribbling, incontinence MSK- denies joint pain, muscle aches, injury Neuro- denies headache, dizziness, syncope, seizure activity  Physical:  GEN- NAD, alert and oriented x3 HEENT- PERRL, EOMI, non injected sclera, pink conjunctiva, MMM, oropharynx clear Neck- Supple, no thryomegaly CVS- RRR, no murmur RESP-CTAB ABD-NABS,soft,NT,ND Psych- normal affect and mood GU-deferred EXT- No edema Pulses- Radial, DP- 2+    Assessment:    Annual wellness medicare exam   Plan:    During the course of the visit the patient was educated and counseled about appropriate screening and preventive services including:  TDAP sent to pharmacy, Flu shot given    Audit C- discussed watching ETOH intake in general  Tobacco cessation discussed   BPH- check PSA, start flomax 0.4mg  at bedtime for symptoms   IBS- continue bentyl, immodium, only thing that has helped has had work up  Hep C- recheck RNA levels  /HIV screen          Diet review for nutrition referral? Yes ____ Not Indicated __x__  Patient Instructions (the written plan) was given to the patient.  Medicare Attestation  I have personally reviewed:   The patient's medical and social history  Their use of alcohol, tobacco or illicit drugs  Their current medications and supplements  The patient's functional ability including ADLs,fall risks, home safety risks, cognitive, and hearing and visual impairment  Diet and physical activities  Evidence for de                  pression or mood disorders  The patient's weight, height, BMI, and visual acuity have been recorded in the chart. I have made referrals, counseling, and provided education to the patient based on review of the above and I have provided the patient with a written personalized care plan for preventive services.

## 2018-09-23 NOTE — Patient Instructions (Signed)
F/U 6 months

## 2018-09-25 ENCOUNTER — Encounter: Payer: Self-pay | Admitting: Family Medicine

## 2018-10-01 LAB — HIV ANTIBODY (ROUTINE TESTING W REFLEX): HIV: NONREACTIVE

## 2018-10-01 LAB — PSA: PSA: 1.1 ng/mL (ref <=4.0)

## 2018-10-01 LAB — HEPATITIS C RNA QUANTITATIVE
HCV Quantitative Log: <1.18 Log IU/mL
HCV RNA, PCR, QN: <15 IU/mL

## 2018-10-03 ENCOUNTER — Other Ambulatory Visit: Payer: Self-pay | Admitting: Family Medicine

## 2018-10-15 ENCOUNTER — Telehealth: Payer: Self-pay | Admitting: *Deleted

## 2018-10-15 NOTE — Telephone Encounter (Signed)
Discussed with fellow MD. Durward Fortes that it could be retrograde ejaculation. As long as there is no pain, it should not cause any issues.   Call placed to patient and patient made aware.

## 2018-10-15 NOTE — Telephone Encounter (Signed)
Received call from patient.   Reports that he has been taking Flomax for BPH and urine stream has improved.   Does report that he has noted some mild SE of medication, mainly the inability to ejaculate. Reports that sensations of orgasm are present, but there is no semen ejaculated.   Inquired as to if this can be harmful. MD please advise.

## 2018-11-12 ENCOUNTER — Other Ambulatory Visit: Payer: Self-pay | Admitting: *Deleted

## 2018-11-12 MED ORDER — OMEPRAZOLE 40 MG PO CPDR: 40.0000 mg | DELAYED_RELEASE_CAPSULE | Freq: Every day | ORAL | 3 refills | Status: DC | PRN

## 2018-11-16 ENCOUNTER — Encounter (HOSPITAL_COMMUNITY): Payer: Self-pay

## 2018-11-16 ENCOUNTER — Other Ambulatory Visit: Payer: Self-pay

## 2018-11-16 ENCOUNTER — Emergency Department (HOSPITAL_COMMUNITY)
Admission: EM | Admit: 2018-11-16 | Discharge: 2018-11-16 | Disposition: A | Discharge status: Home / Self Care | Payer: Medicare Other | Attending: Emergency Medicine | Admitting: Emergency Medicine

## 2018-11-16 DIAGNOSIS — Z79899 Other long term (current) drug therapy: Secondary | ICD-10-CM | POA: Insufficient documentation

## 2018-11-16 DIAGNOSIS — R21 Rash and other nonspecific skin eruption: Secondary | ICD-10-CM

## 2018-11-16 DIAGNOSIS — B86 Scabies: Secondary | ICD-10-CM | POA: Insufficient documentation

## 2018-11-16 DIAGNOSIS — F17210 Nicotine dependence, cigarettes, uncomplicated: Secondary | ICD-10-CM | POA: Diagnosis not present

## 2018-11-16 MED ORDER — PERMETHRIN 5 % EX CREA: TOPICAL_CREAM | CUTANEOUS | 1 refills | Status: DC

## 2018-11-16 MED ORDER — HYDROXYZINE HCL 25 MG PO TABS: 25.0000 mg | ORAL_TABLET | Freq: Four times a day (QID) | ORAL | 0 refills | Status: DC

## 2018-11-16 NOTE — Discharge Instructions (Signed)
As discussed, you are being treated for scabies today, as the progression of this rash is suspicious for this infection.  Make sure you do a thorough cleaning including sheets, towels, linens and any currently unwashed clothing.  Repeat this treatment in 10 days if he develop any new areas of rash.  Expect that you will probably be itchy for a couple of days after today's treatment, this is normal as this infection resolves.
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
## 2018-11-16 NOTE — ED Triage Notes (Addendum)
Pt reports rash for 2 days. Rash itches and is worse at night. Reports that his roommate has a rash as well and was seen . Pt thinks this may be scabies. Pt reports areas on left buttocks, lower back  And left leg

## 2018-12-17 ENCOUNTER — Other Ambulatory Visit: Payer: Self-pay | Admitting: Family Medicine

## 2019-01-01 ENCOUNTER — Encounter (HOSPITAL_COMMUNITY): Payer: Self-pay | Admitting: *Deleted

## 2019-01-01 ENCOUNTER — Emergency Department (HOSPITAL_COMMUNITY): Payer: Medicare Other

## 2019-01-01 ENCOUNTER — Other Ambulatory Visit: Payer: Self-pay

## 2019-01-01 ENCOUNTER — Emergency Department (HOSPITAL_COMMUNITY)
Admission: EM | Admit: 2019-01-01 | Discharge: 2019-01-01 | Disposition: A | Discharge status: Home / Self Care | Payer: Medicare Other | Attending: Emergency Medicine | Admitting: Emergency Medicine

## 2019-01-01 DIAGNOSIS — R05 Cough: Secondary | ICD-10-CM | POA: Diagnosis not present

## 2019-01-01 DIAGNOSIS — F17210 Nicotine dependence, cigarettes, uncomplicated: Secondary | ICD-10-CM | POA: Diagnosis not present

## 2019-01-01 DIAGNOSIS — J40 Bronchitis, not specified as acute or chronic: Secondary | ICD-10-CM

## 2019-01-01 DIAGNOSIS — Z79899 Other long term (current) drug therapy: Secondary | ICD-10-CM | POA: Insufficient documentation

## 2019-01-01 DIAGNOSIS — J069 Acute upper respiratory infection, unspecified: Secondary | ICD-10-CM

## 2019-01-01 DIAGNOSIS — R509 Fever, unspecified: Secondary | ICD-10-CM | POA: Diagnosis present

## 2019-01-01 MED ORDER — ALBUTEROL SULFATE HFA 108 (90 BASE) MCG/ACT IN AERS
1.0000 | INHALATION_SPRAY | Freq: Once | RESPIRATORY_TRACT | Status: AC
  Administered 2019-01-01: 1 via RESPIRATORY_TRACT
  Filled 2019-01-01: qty 6.7

## 2019-01-01 MED ORDER — GUAIFENESIN 100 MG/5ML PO LIQD
100.0000 mg | ORAL | 0 refills | Status: DC | PRN
Start: 2019-01-01 — End: 2019-03-24

## 2019-01-01 MED ORDER — BENZONATATE 100 MG PO CAPS: 100.0000 mg | ORAL_CAPSULE | Freq: Three times a day (TID) | ORAL | 0 refills | Status: DC

## 2019-01-01 MED ORDER — PREDNISONE 10 MG PO TABS
60.0000 mg | ORAL_TABLET | Freq: Once | ORAL | Status: AC
  Administered 2019-01-01: 60 mg via ORAL
  Filled 2019-01-01: qty 1

## 2019-01-01 MED ORDER — PREDNISONE 50 MG PO TABS: 50.0000 mg | ORAL_TABLET | Freq: Every day | ORAL | 0 refills | Status: DC

## 2019-01-01 NOTE — Discharge Instructions (Signed)
You likely have a viral illness.  This should be treated symptomatically. Take prednisone as prescribed. Use your inhaler every 4 hours for the next 2 days.  After this, only as needed for shortness of breath, wheezing, chest tightness, coughing fits. Use Tylenol or ibuprofen as needed for fevers or body aches. Use cough drops and syrup as needed.   Make sure you stay well-hydrated with water. Wash your hands frequently to prevent spread of infection. Follow-up with your primary care doctor in 1 week if your symptoms are not improving. Return to the emergency room if you develop chest pain, difficulty breathing, or any new or worsening symptoms.
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
## 2019-01-01 NOTE — ED Triage Notes (Signed)
States he thinks he has the flu, has had cough, fever, weakness for over 4 days

## 2019-01-06 ENCOUNTER — Other Ambulatory Visit: Payer: Self-pay

## 2019-01-06 ENCOUNTER — Encounter: Payer: Self-pay | Admitting: Family Medicine

## 2019-01-06 ENCOUNTER — Ambulatory Visit (INDEPENDENT_AMBULATORY_CARE_PROVIDER_SITE_OTHER): Payer: Medicare Other | Admitting: Family Medicine

## 2019-01-06 VITALS — BP 126/82 | HR 81 | Temp 98.1°F | Resp 16 | Wt 177.0 lb

## 2019-01-06 DIAGNOSIS — R197 Diarrhea, unspecified: Secondary | ICD-10-CM | POA: Diagnosis not present

## 2019-01-06 DIAGNOSIS — R51 Headache: Secondary | ICD-10-CM

## 2019-01-06 DIAGNOSIS — J989 Respiratory disorder, unspecified: Secondary | ICD-10-CM | POA: Diagnosis not present

## 2019-01-06 DIAGNOSIS — R6889 Other general symptoms and signs: Secondary | ICD-10-CM

## 2019-01-06 DIAGNOSIS — R519 Headache, unspecified: Secondary | ICD-10-CM

## 2019-01-06 LAB — URINALYSIS, ROUTINE W REFLEX MICROSCOPIC
Bilirubin Urine: NEGATIVE
GLUCOSE, UA: NEGATIVE
Hgb urine dipstick: NEGATIVE
Ketones, ur: NEGATIVE
LEUKOCYTE UA: NEGATIVE
Nitrite: NEGATIVE
PH: 7 (ref 5.0–8.0)
Protein, ur: NEGATIVE
SPECIFIC GRAVITY, URINE: 1.02 (ref 1.001–1.03)

## 2019-01-06 LAB — INFLUENZA A AND B AG, IMMUNOASSAY
INFLUENZA A ANTIGEN: NOT DETECTED
INFLUENZA B ANTIGEN: NOT DETECTED

## 2019-01-06 MED ORDER — DOXYCYCLINE HYCLATE 100 MG PO TABS: 100.0000 mg | ORAL_TABLET | Freq: Two times a day (BID) | ORAL | 0 refills | Status: DC

## 2019-01-06 MED ORDER — HYDROCODONE-HOMATROPINE 5-1.5 MG/5ML PO SYRP: 5.0000 mL | ORAL_SOLUTION | Freq: Three times a day (TID) | ORAL | 0 refills | Status: DC | PRN

## 2019-01-06 NOTE — Progress Notes (Signed)
Patient ID: Francis [name], male    DOB: 1971-08-31, 48 y.o.   MRN: [id number]  PCP: Alycia Rossetti, MD  Chief Complaint  Patient presents with  . Cough    Patient in with c/o cough, diarrhea, no energy, and headache . Onset 2 weeks ago.     Subjective:   Francis [name] is a 48 y.o. male, presents to clinic with CC of flu-like sx for 2 weeks.  He was seen in the ER one week ago, dx with URI and bronchitis, CXR was negative, he feels no improvement after tx with steroids, inhalers, and OTC cough meds.  He endorses 2 weeks + of symptoms of headaches, productive cough, diarrhea, sweats, fatigue and generalized malaise and fatigue.  He denies SOB, CP, fever, chills, neck pain. No recent travel. No IVDU, sick contacts.    Patient Active Problem List   Diagnosis Date Noted  . BPH (benign prostatic hyperplasia) 09/23/2018  . Fecal urgency 08/14/2016  . IBS (irritable bowel syndrome) 02/05/2016  . Scabies 06/07/2015  . GAD (generalized anxiety disorder) 02/05/2015  . Sleep apnea 02/05/2015  . Chronic hepatitis C without hepatic coma ([place]) 09/21/2014  . Chronic bronchitis ([place]) 09/21/2014  . GERD (gastroesophageal reflux disease) 09/14/2014  . Dysphagia 09/14/2014  . Panic attacks 09/13/2014  . ED (erectile dysfunction) 07/08/2012  . Substance abuse ([place]) 03/31/2012  . Tobacco user 03/31/2012  . Peripheral neuropathy 03/31/2012  . Mood disorder ([place]) 02/25/2012  . Epilepsy ([place]) 02/17/2012  . S/P cervical discectomy 02/17/2012  . SHOULDER PAIN 03/30/2008     Prior to Admission medications   Medication Sig Start Date End Date Taking? Authorizing Provider  benzonatate (TESSALON) 100 MG capsule Take 1 capsule (100 mg total) by mouth every 8 (eight) hours. 01/01/19  Yes Caccavale, Sophia, PA-C  dicyclomine (BENTYL) 20 MG tablet TAKE 1 TABLET BY MOUTH THREE TIMES DAILY BEFORE MEALS 12/17/18  Yes Lorenzo, Modena Nunnery, MD  loperamide (IMODIUM) 2 MG capsule Take 6 mg by mouth 3 (three)  times daily. When Dicyclomine is taken   Yes [provider]  naproxen sodium (ALEVE) 220 MG tablet Take 440 mg by mouth daily as needed (pain).   Yes [provider]  omeprazole (PRILOSEC) 40 MG capsule Take 1 capsule (40 mg total) by mouth daily as needed. 11/12/18  Yes [place], Modena Nunnery, MD  tamsulosin (FLOMAX) 0.4 MG CAPS capsule Take 1 capsule (0.4 mg total) by mouth daily after supper. 09/23/18  Yes [place], Modena Nunnery, MD  guaiFENesin (ROBITUSSIN) 100 MG/5ML liquid Take 5-10 mLs (100-200 mg total) by mouth every 4 (four) hours as needed for cough. Patient not taking: Reported on 01/06/2019 01/01/19   Caccavale, Sophia, PA-C  hydrOXYzine (ATARAX/VISTARIL) 25 MG tablet Take 1 tablet (25 mg total) by mouth every 6 (six) hours. Patient not taking: Reported on 01/06/2019 11/16/18   Evalee Jefferson, PA-C  permethrin (ELIMITE) 5 % cream Apply from neck down and leave on for 8-12 hours before showering.  Reapply in 10 days if you develop any new areas of rash as discussed. 11/16/18   Evalee Jefferson, PA-C     No Known Allergies   No family history on file.   Social History   Socioeconomic History  . Marital status: Single    Spouse name: Not on file  . Number of children: Not on file  . Years of education: Not on file  . Highest education level: Not on file  Occupational History  . Not on file  Social Needs  . Financial resource strain: Not on file  . Food insecurity:    Worry: Not on file    Inability: Not on file  . Transportation needs:    Medical: Not on file    Non-medical: Not on file  Tobacco Use  . Smoking status: Current Every Day Smoker    Packs/day: 1.00    Years: 30.00    Pack years: 30.00    Types: Cigarettes  . Smokeless tobacco: Former Systems developer  . Tobacco comment: occasionally  Substance and Sexual Activity  . Alcohol use: Yes    Alcohol/week: 0.0 standard drinks    Comment: occasionally  . Drug use: No    Comment: roxicodone-patient states not in months,  denies any use today,   . Sexual activity: Yes    Birth control/protection: None  Lifestyle  . Physical activity:    Days per week: Not on file    Minutes per session: Not on file  . Stress: Not on file  Relationships  . Social connections:    Talks on phone: Not on file    Gets together: Not on file    Attends religious service: Not on file    Active member of club or organization: Not on file    Attends meetings of clubs or organizations: Not on file    Relationship status: Not on file  . Intimate partner violence:    Fear of current or ex partner: Not on file    Emotionally abused: Not on file    Physically abused: Not on file    Forced sexual activity: Not on file  Other Topics Concern  . Not on file  Social History Narrative  . Not on file     Review of Systems  Constitutional: Negative.   HENT: Negative.   Eyes: Negative.   Respiratory: Negative.   Cardiovascular: Negative.   Gastrointestinal: Negative.   Endocrine: Negative.   Genitourinary: Negative.   Musculoskeletal: Negative.   Skin: Negative.   Allergic/Immunologic: Negative.   Neurological: Negative.   Hematological: Negative.   Psychiatric/Behavioral: Negative.   All other systems reviewed and are negative.      Objective:    Vitals:   01/06/19 1419  BP: 126/82  Pulse: 81  Resp: 16  Temp: 98.1 F (36.7 C)  TempSrc: Oral  SpO2: 98%  Weight: 177 lb (80.3 kg)      Physical Exam Vitals signs and nursing note reviewed.  Constitutional:      General: He is not in acute distress.    Appearance: He is well-developed. He is diaphoretic. He is not ill-appearing or toxic-appearing.  HENT:     Head: Normocephalic and atraumatic.     Right Ear: Tympanic membrane, ear canal and external ear normal. There is no impacted cerumen.     Left Ear: Tympanic membrane, ear canal and external ear normal. There is no impacted cerumen.     Nose: Congestion and rhinorrhea present.     Mouth/Throat:      Mouth: Mucous membranes are dry.     Pharynx: Posterior oropharyngeal erythema present. No oropharyngeal exudate.  Eyes:     General: No scleral icterus.       Right eye: No discharge.        Left eye: No discharge.     Conjunctiva/sclera: Conjunctivae normal.  Neck:     Musculoskeletal: No muscular tenderness.     Trachea: No tracheal deviation.  Cardiovascular:     Rate and Rhythm:  Normal rate and regular rhythm.     Pulses: Normal pulses.     Heart sounds: Normal heart sounds. No murmur. No friction rub. No gallop.   Pulmonary:     Effort: Pulmonary effort is normal. No tachypnea, accessory muscle usage or respiratory distress.     Breath sounds: Normal breath sounds. No stridor, decreased air movement or transmitted upper airway sounds. No decreased breath sounds, wheezing, rhonchi or rales.  Chest:     Chest wall: No tenderness.  Abdominal:     General: Bowel sounds are normal. There is no distension.     Palpations: Abdomen is soft.     Tenderness: There is no abdominal tenderness. There is no guarding.  Musculoskeletal: Normal range of motion.     Right lower leg: No edema.     Left lower leg: No edema.  Lymphadenopathy:     Cervical: No cervical adenopathy.  Skin:    General: Skin is warm.     Capillary Refill: Capillary refill takes less than 2 seconds.     Coloration: Skin is not jaundiced or pale.     Findings: No rash.  Neurological:     Mental Status: He is alert.     Motor: No abnormal muscle tone.     Coordination: Coordination normal.     Gait: Gait normal.  Psychiatric:        Attention and Perception: Attention normal.        Mood and Affect: Mood normal.        Speech: Speech normal.        Behavior: Behavior is cooperative.           Assessment & Plan:      ICD-10-CM   1. Respiratory illness Y07.3 COMPLETE METABOLIC PANEL WITH GFR    CBC with Differential/Platelet    Urinalysis, Routine w reflex microscopic    Influenza A and B Ag,  Immunoassay    Culture, blood (single)    Culture, blood (single)    CANCELED: Influenza A/B  2. Diarrhea, unspecified type X10.6 COMPLETE METABOLIC PANEL WITH GFR    CBC with Differential/Platelet    Urinalysis, Routine w reflex microscopic    Magnesium  3. Acute nonintractable headache, unspecified headache type Y69 COMPLETE METABOLIC PANEL WITH GFR    CBC with Differential/Platelet    Urinalysis, Routine w reflex microscopic   I suspect from illness and mild dehydration  4. Flu-like symptoms R68.89 Culture, blood (single)    Culture, blood (single)    48 y/o male with 2 weeks of sx, cough, sweats, fatigue, headache.  He appears mildly dehydrated, although he says he's been drinking ample fluids.  His lungs are clear, stomach exam unremarkable/benign, otherwise has signs of URI.  He is very adamant that something is still wrong with him to make him feel this bad.  Flu was obtained, as it was not previously done, and although he would not qualify for tx, he would like to know if he has flu.  Testing done was negative.  Will obtain blood work.  Cover possible sinusitis (more than 2 weeks sx and nasal signs and sx = indication for abx coverage for ABS) and will cover for atypical pneumonia with doxycycline.  F/up if not improving.  Prior to leaving, pt was able to ambulate around clinic and maintain pulse ox 95-98%    Delsa Grana, PA-C 01/06/19 2:29 PM

## 2019-01-06 NOTE — Patient Instructions (Addendum)
Continue to rest, push fluids (that have some salts/electrolytes/sugars) use tylenol and ibuprofen and start the antibiotics which would cover sinus infections, pneumonia, MRSA, and a wide variety of other bacteria and illnesses  We will call you with lab results.  If you are not starting to feel better in the next week, or if anything changes or worsens, please come back so we can recheck you.  A bad bronchitis and viral illness can cause you to be ill for a few days to a few weeks - some people cough for 2 months - everyone is different.   Viral Illness, Adult Viruses are tiny germs that can get into a person's body and cause illness. There are many different types of viruses, and they cause many types of illness. Viral illnesses can range from mild to severe. They can affect various parts of the body. Common illnesses that are caused by a virus include colds and the flu. Viral illnesses also include serious conditions such as HIV/AIDS (human immunodeficiency virus/acquired immunodeficiency syndrome). A few viruses have been linked to certain cancers. What are the causes? Many types of viruses can cause illness. Viruses invade cells in your body, multiply, and cause the infected cells to malfunction or die. When the cell dies, it releases more of the virus. When this happens, you develop symptoms of the illness, and the virus continues to spread to other cells. If the virus takes over the function of the cell, it can cause the cell to divide and grow out of control, as is the case when a virus causes cancer. Different viruses get into the body in different ways. You can get a virus by:  Swallowing food or water that is contaminated with the virus.  Breathing in droplets that have been coughed or sneezed into the air by an infected person.  Touching a surface that has been contaminated with the virus and then touching your eyes, nose, or mouth.  Being bitten by an insect or animal that carries  the virus.  Having sexual contact with a person who is infected with the virus.  Being exposed to blood or fluids that contain the virus, either through an open cut or during a transfusion. If a virus enters your body, your body's defense system (immune system) will try to fight the virus. You may be at higher risk for a viral illness if your immune system is weak. What are the signs or symptoms? Symptoms vary depending on the type of virus and the location of the cells that it invades. Common symptoms of the main types of viral illnesses include: Cold and flu viruses  Fever.  Headache.  Sore throat.  Muscle aches.  Nasal congestion.  Cough. Digestive system (gastrointestinal) viruses  Fever.  Abdominal pain.  Nausea.  Diarrhea. Liver viruses (hepatitis)  Loss of appetite.  Tiredness.  Yellowing of the skin (jaundice). Brain and spinal cord viruses  Fever.  Headache.  Stiff neck.  Nausea and vomiting.  Confusion or sleepiness. Skin viruses  Warts.  Itching.  Rash. Sexually transmitted viruses  Discharge.  Swelling.  Redness.  Rash. How is this treated? Viruses can be difficult to treat because they live within cells. Antibiotic medicines do not treat viruses because these drugs do not get inside cells. Treatment for a viral illness may include:  Resting and drinking plenty of fluids.  Medicines to relieve symptoms. These can include over-the-counter medicine for pain and fever, medicines for cough or congestion, and medicines to relieve diarrhea.  Antiviral medicines. These drugs are available only for certain types of viruses. They may help reduce flu symptoms if taken early. There are also many antiviral medicines for hepatitis and HIV/AIDS. Some viral illnesses can be prevented with vaccinations. A common example is the flu shot. Follow these instructions at home: Medicines   Take over-the-counter and prescription medicines only as told  by your health care provider.  If you were prescribed an antiviral medicine, take it as told by your health care provider. Do not stop taking the medicine even if you start to feel better.  Be aware of when antibiotics are needed and when they are not needed. Antibiotics do not treat viruses. If your health care provider thinks that you may have a bacterial infection as well as a viral infection, you may get an antibiotic. ? Do not ask for an antibiotic prescription if you have been diagnosed with a viral illness. That will not make your illness go away faster. ? Frequently taking antibiotics when they are not needed can lead to antibiotic resistance. When this develops, the medicine no longer works against the bacteria that it normally fights. General instructions  Drink enough fluids to keep your urine clear or pale yellow.  Rest as much as possible.  Return to your normal activities as told by your health care provider. Ask your health care provider what activities are safe for you.  Keep all follow-up visits as told by your health care provider. This is important. How is this prevented? Take these actions to reduce your risk of viral infection:  Eat a healthy diet and get enough rest.  Wash your hands often with soap and water. This is especially important when you are in public places. If soap and water are not available, use hand sanitizer.  Avoid close contact with friends and family who have a viral illness.  If you travel to areas where viral gastrointestinal infection is common, avoid drinking water or eating raw food.  Keep your immunizations up to date. Get a flu shot every year as told by your health care provider.  Do not share toothbrushes, nail clippers, razors, or needles with other people.  Always practice safe sex.  Contact a health care provider if:  You have symptoms of a viral illness that do not go away.  Your symptoms come back after going away.  Your  symptoms get worse. Get help right away if:  You have trouble breathing.  You have a severe headache or a stiff neck.  You have severe vomiting or abdominal pain. This information is not intended to replace advice given to you by your health care provider. Make sure you discuss any questions you have with your health care provider. Document Released: 02/23/2016 Document Revised: 03/27/2016 Document Reviewed: 02/23/2016 Elsevier Interactive Patient Education  2019 [employer].

## 2019-01-07 LAB — COMPLETE METABOLIC PANEL WITH GFR
AG Ratio: 1.9 (calc) (ref 1.0–2.5)
ALBUMIN MSPROF: 4.7 g/dL (ref 3.6–5.1)
ALKALINE PHOSPHATASE (APISO): 65 U/L (ref 36–130)
ALT: 11 U/L (ref 9–46)
AST: 13 U/L (ref 10–40)
BILIRUBIN TOTAL: 0.8 mg/dL (ref 0.2–1.2)
BUN / CREAT RATIO: 27 (calc) — AB (ref 6–22)
BUN: 28 mg/dL — ABNORMAL HIGH (ref 7–25)
CO2: 23 mmol/L (ref 20–32)
CREATININE: 1.05 mg/dL (ref 0.60–1.35)
Calcium: 9.9 mg/dL (ref 8.6–10.3)
Chloride: 107 mmol/L (ref 98–110)
GFR, EST AFRICAN AMERICAN: 97 mL/min/{1.73_m2} (ref >=60)
GFR, Est Non African American: 84 mL/min/{1.73_m2} (ref >=60)
GLOBULIN: 2.5 g/dL (ref 1.9–3.7)
Glucose, Bld: 90 mg/dL (ref 65–99)
Potassium: 4 mmol/L (ref 3.5–5.3)
SODIUM: 140 mmol/L (ref 135–146)
TOTAL PROTEIN: 7.2 g/dL (ref 6.1–8.1)

## 2019-01-07 LAB — CBC WITH DIFFERENTIAL/PLATELET
ABSOLUTE MONOCYTES: 363 {cells}/uL (ref 200–950)
BASOS PCT: 0.5 %
Basophils Absolute: 33 cells/uL (ref 0–200)
Eosinophils Absolute: 79 cells/uL (ref 15–500)
Eosinophils Relative: 1.2 %
HEMATOCRIT: 41.6 % (ref 38.5–50.0)
Hemoglobin: 14.3 g/dL (ref 13.2–17.1)
LYMPHS ABS: 1993 {cells}/uL (ref 850–3900)
MCH: 28.5 pg (ref 27.0–33.0)
MCHC: 34.4 g/dL (ref 32.0–36.0)
MCV: 83 fL (ref 80.0–100.0)
MPV: 11.7 fL (ref 7.5–12.5)
Monocytes Relative: 5.5 %
Neutro Abs: 4132 cells/uL (ref 1500–7800)
Neutrophils Relative %: 62.6 %
Platelets: 176 10*3/uL (ref 140–400)
RBC: 5.01 10*6/uL (ref 4.20–5.80)
RDW: 13.1 % (ref 11.0–15.0)
Total Lymphocyte: 30.2 %
WBC: 6.6 10*3/uL (ref 3.8–10.8)

## 2019-01-07 LAB — MAGNESIUM: Magnesium: 1.8 mg/dL (ref 1.5–2.5)

## 2019-01-12 ENCOUNTER — Encounter: Payer: Self-pay | Admitting: Family Medicine

## 2019-01-12 ENCOUNTER — Other Ambulatory Visit: Payer: Self-pay

## 2019-01-12 ENCOUNTER — Ambulatory Visit (INDEPENDENT_AMBULATORY_CARE_PROVIDER_SITE_OTHER): Payer: Medicare Other | Admitting: Family Medicine

## 2019-01-12 VITALS — BP 130/88 | HR 95 | Temp 98.6°F | Resp 18 | Ht 69.0 in | Wt 178.4 lb

## 2019-01-12 DIAGNOSIS — J42 Unspecified chronic bronchitis: Secondary | ICD-10-CM | POA: Diagnosis not present

## 2019-01-12 DIAGNOSIS — J40 Bronchitis, not specified as acute or chronic: Secondary | ICD-10-CM

## 2019-01-12 DIAGNOSIS — R5383 Other fatigue: Secondary | ICD-10-CM

## 2019-01-12 DIAGNOSIS — F39 Unspecified mood [affective] disorder: Secondary | ICD-10-CM

## 2019-01-12 DIAGNOSIS — Z720 Tobacco use: Secondary | ICD-10-CM

## 2019-01-12 DIAGNOSIS — F410 Panic disorder [episodic paroxysmal anxiety] without agoraphobia: Secondary | ICD-10-CM

## 2019-01-12 DIAGNOSIS — R0609 Other forms of dyspnea: Secondary | ICD-10-CM

## 2019-01-12 DIAGNOSIS — F411 Generalized anxiety disorder: Secondary | ICD-10-CM

## 2019-01-12 DIAGNOSIS — R197 Diarrhea, unspecified: Secondary | ICD-10-CM

## 2019-01-12 DIAGNOSIS — F1911 Other psychoactive substance abuse, in remission: Secondary | ICD-10-CM

## 2019-01-12 MED ORDER — PREDNISONE 20 MG PO TABS: ORAL_TABLET | ORAL | 0 refills | Status: DC

## 2019-01-12 MED ORDER — BUDESONIDE-FORMOTEROL FUMARATE 160-4.5 MCG/ACT IN AERO: 2.0000 | INHALATION_SPRAY | Freq: Two times a day (BID) | RESPIRATORY_TRACT | 3 refills | Status: DC

## 2019-01-12 NOTE — Progress Notes (Signed)
Patient ID: Francis [name], male    DOB: 1971-07-13, 48 y.o.   MRN: [id number]  PCP: Alycia Rossetti, MD  Chief Complaint  Patient presents with   URI    sob, no fever, fatigue, muscle aches, using inhale with relief    Subjective:   Francis [name] is a 48 y.o. male, returns to clinic with continued sx, feels worse since being seen last week, he has taken abx, he is coughing slightly less, less productive, but continues to use inhaler frequently and feel SOB with any physical exertion.   He continues to have generalized pounding headache, decreased appetite, severe fatigue/malaise, myalgias, sweats, abdominal cramping and diarrhea.   He reports he lost more weight, but is actually up one lbs from last week.  No jaundice, pallor, rash, arthralgias, joint swelling, CP, fever, chills, neck pain/stiffness.   He is here with his partner, who asks if he needs to be tested for "HIV again?"  I asked if any new high risk behavior in the past 6-12 months, negative HIV test only 4 months ago, specifically pt denies any IVDU and denies high risk sexual behavior.   His diarrhea became worse yesterday, after 5 days of abx.  No mucous, blood, melena.  Stomach cramping is all over, intermittent.  He is drinking ensure in the am, mostly water and gatorade throughout the day, not eating as much as normal.  No urinary sx, he is making normal amount of urine - UA last week was negative.   He asks if this could be "all in [his] head?"    He continues to deny any travel hx in the last couple months, no sick contacts.   Have reviewed chart and in the past he was supposed to do PFT's for suspected COPD, these were not done.    Hx of Hep C, last labs 09/23/2018 Hep C RNA/quant undetectable.   Last week 01/06/2019, point-of-care influenza test was negative, magnesium was normal, CBC normal, CMP unremarkable except for mildly elevated BUN, and blood cultures x2 were negative.   01/06/2019 Francis [name] is  a 48 y.o. male, presents to clinic with CC of flu-like sx for 2 weeks.  He was seen in the ER one week ago, dx with URI and bronchitis, CXR was negative, he feels no improvement after tx with steroids, inhalers, and OTC cough meds.  He endorses 2 weeks + of symptoms of headaches, productive cough, diarrhea, sweats, fatigue and generalized malaise and fatigue.  He denies SOB, CP, fever, chills, neck pain. No recent travel. No IVDU, sick contacts.    Patient Active Problem List   Diagnosis Date Noted   BPH (benign prostatic hyperplasia) 09/23/2018   Fecal urgency 08/14/2016   IBS (irritable bowel syndrome) 02/05/2016   Scabies 06/07/2015   GAD (generalized anxiety disorder) 02/05/2015   Sleep apnea 02/05/2015   Chronic hepatitis C without hepatic coma ([place]) 09/21/2014   Chronic bronchitis ([place]) 09/21/2014   GERD (gastroesophageal reflux disease) 09/14/2014   Dysphagia 09/14/2014   Panic attacks 09/13/2014   ED (erectile dysfunction) 07/08/2012   Substance abuse ([place]) 03/31/2012   Tobacco user 03/31/2012   Peripheral neuropathy 03/31/2012   Mood disorder ([place]) 02/25/2012   Epilepsy ([place]) 02/17/2012   S/P cervical discectomy 02/17/2012   SHOULDER PAIN 03/30/2008     Prior to Admission medications   Medication Sig Start Date End Date Taking? Authorizing Provider  benzonatate (TESSALON) 100 MG capsule Take 1 capsule (100 mg total) by mouth every 8 (  eight) hours. 01/01/19  Yes Caccavale, Sophia, PA-C  dicyclomine (BENTYL) 20 MG tablet TAKE 1 TABLET BY MOUTH THREE TIMES DAILY BEFORE MEALS 12/17/18  Yes Spelter, Modena Nunnery, MD  loperamide (IMODIUM) 2 MG capsule Take 6 mg by mouth 3 (three) times daily. When Dicyclomine is taken   Yes [provider]  naproxen sodium (ALEVE) 220 MG tablet Take 440 mg by mouth daily as needed (pain).   Yes [provider]  omeprazole (PRILOSEC) 40 MG capsule Take 1 capsule (40 mg total) by mouth daily as needed. 11/12/18  Yes  [place], Modena Nunnery, MD  tamsulosin (FLOMAX) 0.4 MG CAPS capsule Take 1 capsule (0.4 mg total) by mouth daily after supper. 09/23/18  Yes [place], Modena Nunnery, MD  guaiFENesin (ROBITUSSIN) 100 MG/5ML liquid Take 5-10 mLs (100-200 mg total) by mouth every 4 (four) hours as needed for cough. Patient not taking: Reported on 01/06/2019 01/01/19   Caccavale, Sophia, PA-C  hydrOXYzine (ATARAX/VISTARIL) 25 MG tablet Take 1 tablet (25 mg total) by mouth every 6 (six) hours. Patient not taking: Reported on 01/06/2019 11/16/18   Evalee Jefferson, PA-C  permethrin (ELIMITE) 5 % cream Apply from neck down and leave on for 8-12 hours before showering.  Reapply in 10 days if you develop any new areas of rash as discussed. 11/16/18   Evalee Jefferson, PA-C     No Known Allergies   No family history on file.   Social History   Socioeconomic History   Marital status: Single    Spouse name: Not on file   Number of children: Not on file   Years of education: Not on file   Highest education level: Not on file  Occupational History   Not on file  Social Needs   Financial resource strain: Not on file   Food insecurity:    Worry: Not on file    Inability: Not on file   Transportation needs:    Medical: Not on file    Non-medical: Not on file  Tobacco Use   Smoking status: Current Every Day Smoker    Packs/day: 1.00    Years: 30.00    Pack years: 30.00    Types: Cigarettes   Smokeless tobacco: Former Systems developer   Tobacco comment: occasionally  Substance and Sexual Activity   Alcohol use: Yes    Alcohol/week: 0.0 standard drinks    Comment: occasionally   Drug use: No    Comment: roxicodone-patient states not in months, denies any use today,    Sexual activity: Yes    Birth control/protection: None  Lifestyle   Physical activity:    Days per week: Not on file    Minutes per session: Not on file   Stress: Not on file  Relationships   Social connections:    Talks on phone: Not on file    Gets  together: Not on file    Attends religious service: Not on file    Active member of club or organization: Not on file    Attends meetings of clubs or organizations: Not on file    Relationship status: Not on file   Intimate partner violence:    Fear of current or ex partner: Not on file    Emotionally abused: Not on file    Physically abused: Not on file    Forced sexual activity: Not on file  Other Topics Concern   Not on file  Social History Narrative   Not on file     Review  of Systems  Constitutional: Negative.   HENT: Negative.   Eyes: Negative.   Respiratory: Negative.   Cardiovascular: Negative.   Gastrointestinal: Negative.   Endocrine: Negative.   Genitourinary: Negative.   Musculoskeletal: Negative.   Skin: Negative.   Allergic/Immunologic: Negative.   Neurological: Negative.   Hematological: Negative.   Psychiatric/Behavioral: Negative.   All other systems reviewed and are negative.      Objective:    Vitals:   01/12/19 1011  BP: 130/88  Pulse: 95  Resp: 18  Temp: 98.6 F (37 C)  TempSrc: Oral  SpO2: 97%  Weight: 178 lb 6 oz (80.9 kg)  Height: 5\' 9"  (1.753 m)      Physical Exam Vitals signs and nursing note reviewed.  Constitutional:      General: He is not in acute distress.    Appearance: Normal appearance. He is well-developed. He is diaphoretic (mildly only to hands). He is not ill-appearing or toxic-appearing.  HENT:     Head: Normocephalic and atraumatic.     Jaw: No trismus.     Right Ear: Tympanic membrane, ear canal and external ear normal.     Left Ear: Tympanic membrane, ear canal and external ear normal.     Nose: Nose normal. No mucosal edema, congestion or rhinorrhea.     Right Sinus: No maxillary sinus tenderness or frontal sinus tenderness.     Left Sinus: No maxillary sinus tenderness or frontal sinus tenderness.     Mouth/Throat:     Mouth: Mucous membranes are moist.     Pharynx: Oropharynx is clear. Uvula midline.  No oropharyngeal exudate, posterior oropharyngeal erythema or uvula swelling.  Eyes:     General: Lids are normal. No scleral icterus.    Extraocular Movements:     Right eye: Normal extraocular motion.     Left eye: Normal extraocular motion.     Conjunctiva/sclera: Conjunctivae normal.     Right eye: Right conjunctiva is not injected. No chemosis.    Left eye: Left conjunctiva is not injected. No chemosis.    Pupils: Pupils are equal, round, and reactive to light.     Comments: Mild palpebral conjunctiva pallor  Neck:     Musculoskeletal: Normal range of motion and neck supple. Normal range of motion. No neck rigidity, spinous process tenderness or muscular tenderness.     Trachea: Trachea and phonation normal. No tracheal deviation.  Cardiovascular:     Rate and Rhythm: Regular rhythm.     Pulses: Normal pulses.          Radial pulses are 2+ on the right side and 2+ on the left side.       Posterior tibial pulses are 2+ on the right side and 2+ on the left side.     Heart sounds: Normal heart sounds. No murmur. No friction rub. No gallop.   Pulmonary:     Effort: Pulmonary effort is normal. No tachypnea, accessory muscle usage, respiratory distress or retractions.     Breath sounds: No stridor, decreased air movement or transmitted upper airway sounds. Examination of the right-lower field reveals decreased breath sounds. Examination of the left-lower field reveals decreased breath sounds. Decreased breath sounds (mildly diminished b/l at the bases) present. No wheezing, rhonchi or rales.  Chest:     Chest wall: No tenderness.  Abdominal:     General: Bowel sounds are increased. There is no distension or abdominal bruit.     Palpations: Abdomen is soft. There is no hepatomegaly,  splenomegaly or mass.     Tenderness: There is no abdominal tenderness. There is no right CVA tenderness, left CVA tenderness, guarding or rebound. Negative signs include Murphy's sign and McBurney's sign.    Musculoskeletal: Normal range of motion.     Comments: Large muscle compartments soft to palpation - b/l calves, thighs, arms No joint edema, erythema or effusion  Lymphadenopathy:     Cervical: No cervical adenopathy.  Skin:    General: Skin is warm.     Capillary Refill: Capillary refill takes less than 2 seconds.     Coloration: Skin is not cyanotic, jaundiced or pale.     Findings: No rash.     Nails: There is no clubbing.   Neurological:     Mental Status: He is alert.     Cranial Nerves: Cranial nerves are intact.     Sensory: Sensation is intact.     Motor: Motor function is intact.     Coordination: Coordination is intact.     Gait: Gait normal.  Psychiatric:        Attention and Perception: Attention normal.        Mood and Affect: Mood is anxious and depressed.        Speech: Speech normal.        Behavior: Behavior normal. Behavior is cooperative.        Thought Content: Thought content normal.     EKG: NSR, rate 77, normal axis, good R-wave progression, no ST elevation or depression, some artifact     Assessment & Plan:    Pt returns not feeling any better, diarrhea and abd cramping worse, exertional dyspnea is new since he tried to return to work yesterday, he reports that he continues to loose weight- although weight is up since last week, has decreased appetite, body aches, weakness.  Most sx are the same, he seems less sweaty today.  He notes cough is less frequent and less productive.  I suspect worsening diarrhea relative to his chronic diarrhea is secondary to abx use. He refuses labs here due to phlebotomist technique and multiple sticks last time.  We did try to find a vein to give IVF to him, because he still appears slightly hypovolemic, although VSS, but the vein blew and he refused any further attempts - he says he will push more fluids (with oral rehydration solution suggestions) and go to the ER if that does not help.   He was apparently told on the  phone that "we were thinking COVID-19..." and he was "instructed to come today for testing" that we have testing and could come out to his car to test him.  He does not know who he talked to yesterday when he called to make an appointment, and there is no telephone call encounter documenting the conversation.  Per our criteria, I do not believe he requires COVID-19 testing, sx have been ongoing for almost 3 weeks, no travel, no contacts with sx people or with recent travel.  He likely has undiagnosed COPD, his PCP started tx and ordered testing but pt was lost to follow up, I suspect he had viral URI vs flu initially and with underlying COPD, has had prolonged illness and from a respiratory standpoint, he is starting to improve.  Plan to rx prednisone again, start symbicort, and recheck CXR if not improving.  01/01/2019 CXR shows to pneumonia and chronic or recurrent peribronchial thickening - suggestive or chronic or recurrent bronchitis - fits with hx. I have  told him that he need f/up with his PCP to get PFT's done.   He did not seem to get his lab results although it is well documented that he was notified last week.   EKG done here today for his profound fatigue and exertional dyspnea, again pt looks mildly hypovolemic, do not suspect CHF, EKG has some artifact, but over is NSR w/o ischemic changes, unlikely to be cardiac component to his sx.  I read back through several ER visits for similar presentation, all with negative fairly extensive work-up and several commented on possible psychiatric component to his fatigue, and he has well documented similar sx of fatigue, malaise, sweats, abd sx, diarrhea.  He did try to go to work yesterday and he didn't feel well.  He denies feeling down or depressed.  I did tell him that this definitely seems related to a viral illness with underlying lung disease making his symptoms very prolonged and frustrating, I do not think it is all in "his head", but with his  extensive medical history, substance abuse etc. in the past I do not think psychiatry evaluation could hurt. Referral put through with his past dx.  He appears minimally anxious now, mostly he looks tired and mildly ill and is very appropriate, even though he is understandably frustrated.  Recheck labs at outside lab - per pt's preference. For fatigue, r/o anemia, deficiencies Possible dehydration secondary to likely viral illness, recheck CMP and check lipase  Pt to follow up next week if not improving - longer pred rx, symbicort and SABA for cough/SOB, suspected COPD exacerbation.  Can finish two more days of doxy, take at different time than prednisone, add in probiotic.    ICD-10-CM   1. Fatigue, unspecified type R53.83 EKG 12-Lead    Lipase    Iron, TIBC and Ferritin Panel    Vitamin B12    VITAMIN D 25 Hydroxy (Vit-D Deficiency, Fractures)    Comprehensive metabolic panel    CBC+Platelet+Hem Review  2. Bronchitis J40 DG Chest 2 View    budesonide-formoterol (SYMBICORT) 160-4.5 MCG/ACT inhaler    Pulmonary Function Test  3. Diarrhea, unspecified type R19.7 Lipase    Comprehensive metabolic panel  4. Exertional dyspnea R06.09 EKG 12-Lead    CBC+Platelet+Hem Review    DG Chest 2 View    predniSONE (DELTASONE) 20 MG tablet    budesonide-formoterol (SYMBICORT) 160-4.5 MCG/ACT inhaler    Pulmonary Function Test  5. Chronic bronchitis, unspecified chronic bronchitis type (HCC) J42 predniSONE (DELTASONE) 20 MG tablet    budesonide-formoterol (SYMBICORT) 160-4.5 MCG/ACT inhaler    Pulmonary Function Test  6. Mood disorder ([place]) [place] Ambulatory referral to Psychiatry  7. GAD (generalized anxiety disorder) F41.1 Ambulatory referral to Psychiatry  8. Panic attacks F41.0 Ambulatory referral to Psychiatry  9. History of substance abuse ([place]) F19.11 Ambulatory referral to Psychiatry  10. Tobacco user Z72.0 Pulmonary Function Test       Delsa Grana, PA-C 01/12/19 10:25 AM

## 2019-01-12 NOTE — Assessment & Plan Note (Signed)
Likely prolonged COPD exacerbations, start symbicort 2 puffs BID, f/up PFT's ordered, encouraged to f/up with PCP

## 2019-01-12 NOTE — Patient Instructions (Signed)
Take the antibiotics for 2 more days, take at a different time than the steroids.  Take a probiotic and rehydration packets - ask your pharmacist.    Use the daily maintenance inhaler 2 puff in the morning and 2 at night, and continue to use mucinex and albuterol inhaler with short of breath.  [facility] additional [facility] at [facility]  Can go to [facility] to get a recheck Chest x-ray - essentially a still negative xray would be very reassuring that its not COVID-19 (moderate to severe illness from)  Go to the ER if you cannot drink enough fluids to pee several times a day and go if you have any worsening confusion, near passing out episodes, lethargy.   Chronic Bronchitis Chronic bronchitis is long-lasting inflammation of the tubes that carry air into your lungs (bronchial tubes). This is inflammation that occurs:  On most days of the week.  For at least three months at a time.  Over a period of two years in a row. When the bronchial tubes are inflamed, they start to produce mucus. The inflammation and buildup of mucus make it more difficult to breathe. Chronic bronchitis is usually a permanent problem. It is one type of chronic obstructive pulmonary disease (COPD). People with chronic bronchitis are more likely to get frequent colds or respiratory infections. What are the causes? Chronic bronchitis most often occurs in people who:  Have chronic, severe asthma.  Have a history of smoking.  Have asthma and smoke.  Have certain lung diseases.  Have had long-term exposure to certain irritating fumes or chemicals. What are the signs or symptoms? Symptoms of chronic bronchitis may include:  A cough that brings up mucus (productive cough).  Shortness of breath.  Loud breathing (wheezing).  Chest discomfort.  Frequent (recurring) colds or respiratory infections. Certain things can trigger chronic bronchitis symptoms or make them worse, such as:  Infections.  Stopping certain  medicines.  Smoking.  Exposure to chemicals. How is this diagnosed? This condition may be diagnosed based on:  Your symptoms and medical history.  A physical exam.  A chest X-ray.  Lung (pulmonary) function tests. How is this treated? There is no cure for chronic bronchitis, but treatment can help control your symptoms. Treatment may include:  Using a cool mist vaporizer or humidifier to make it easier to breathe.  Drinking more fluids. Drinking more makes your mucus thinner, which may make it easier to breathe.  Lifestyle changes, such as eating a healthier diet and getting more exercise.  Medicines, such as: ? Inhalers to improve air flow in and out of your lungs. ? Antibiotics to treat any bacterial infections you have, such as:  Lung infection (pneumonia).  Sinus infection.  A sudden, severe (acute) episode of bronchitis.  Oxygen therapy.  Preventing infections by keeping up to date on vaccinations, including the pneumonia and flu vaccines.  Pulmonary rehabilitation. This is a program that helps you manage your breathing problems and improve your quality of life. It may last for up to 4-12 weeks and may include exercise programs, education, counseling, and treatment support. Follow these instructions at home: Medicines  Take over-the-counter and prescription medicines only as told by your health care provider.  If you were prescribed an antibiotic medicine, take it as told by your health care provider. Do not stop taking the antibiotic even if you start to feel better. Preventing infections  Get vaccinations as told by your health care provider. Make sure you get a flu  shot (influenza vaccine) every year.  Wash your hands often with soap and water. If soap and water are not available, use hand sanitizer.  Avoid contact with people who have symptoms of a cold or the flu. Managing symptoms   Do not smoke, and avoid secondhand smoke. Exposure to cigarette smoke  or irritating chemicals will make bronchitis worse. If you smoke and you need help quitting, ask your health care provider. Quitting smoking will help your lungs heal faster.  Use an inhaler, cool mist vaporizer, or humidifier as told by your health care provider.  Avoid pollen, dust, animal dander, molds, smoke, and other things that cause shortness of breath or wheezing attacks.  Use oxygen therapy at home as directed. Follow instructions from your health care provider about how to use oxygen safely and take precautions to prevent fire. Make sure you never smoke while using oxygen or allow others to smoke in your home.  Do not wait to get medical care if you have any concerning symptoms or trouble breathing. Waiting could cause permanent injury and may be life threatening. General instructions  Talk with your health care provider about what activities are safe for you and about possible exercise routines. Regular exercise is very important to help you feel better.  Drink enough fluids to keep your urine pale yellow.  Keep all follow-up visits as told by your health care provider. This is important. Contact a health care provider if:  You have coughing or shortness of breath that gets worse.  You have muscle aches.  You have chest pain.  Your mucus seems to get thicker.  Your mucus changes from clear or white to yellow, green, gray, or bloody. Get help right away if:  Your usual medicines do not stop your wheezing.  You have severe difficulty breathing. These symptoms may represent a serious problem that is an emergency. Do not wait to see if the symptoms will go away. Get medical help right away. Call your local emergency services (911 in the [place] Do not drive yourself to the hospital. Summary  Chronic bronchitis is long-lasting inflammation of the tubes that carry air into your lungs (bronchial tubes).  Chronic bronchitis is usually a permanent problem. It is one type of  chronic obstructive pulmonary disease (COPD).  There is no cure for chronic bronchitis, but treatment can help control your symptoms.  Do not smoke, and avoid secondhand smoke. Exposure to cigarette smoke or irritating chemicals will make bronchitis worse. This information is not intended to replace advice given to you by your health care provider. Make sure you discuss any questions you have with your health care provider. Document Released: 08/01/2006 Document Revised: 09/03/2017 Document Reviewed: 09/03/2017 Elsevier Interactive Patient Education  2019 Else[employer] Inc[employer]    Dehydration, Adult  Dehydration is when there is not enough fluid or water in your body. This happens when you lose more fluids than you take in. Dehydration can range from mild to very bad. It should be treated right away to keep it from getting very bad. Symptoms of mild dehydration may include:  Thirst.  Dry lips.  Slightly dry mouth.  Dry, warm skin.  Dizziness. Symptoms of moderate dehydration may include:  Very dry mouth.  Muscle cramps.  Dark pee (urine). Pee may be the color of tea.  Your body making less pee.  Your eyes making fewer tears.  Heartbeat that is uneven or faster than normal (palpitations).  Headache.  Light-headedness, especially when you stand up from sitting.  Fainting (syncope). Symptoms of very bad dehydration may include:  Changes in skin, such as: ? Cold and clammy skin. ? Blotchy (mottled) or pale skin. ? Skin that does not quickly return to normal after being lightly pinched and let go (poor skin turgor).  Changes in body fluids, such as: ? Feeling very thirsty. ? Your eyes making fewer tears. ? Not sweating when body temperature is high, such as in hot weather. ? Your body making very little pee.  Changes in vital signs, such as: ? Weak pulse. ? Pulse that is more than 100 beats a minute when you are sitting still. ? Fast breathing. ? Low blood  pressure.  Other changes, such as: ? Sunken eyes. ? Cold hands and feet. ? Confusion. ? Lack of energy (lethargy). ? Trouble waking up from sleep. ? Short-term weight loss. ? Unconsciousness. Follow these instructions at home:   If told by your doctor, drink an ORS: ? Make an ORS by using instructions on the package. ? Start by drinking small amounts, about  cup (120 mL) every 5-10 minutes. ? Slowly drink more until you have had the amount that your doctor said to have.  Drink enough clear fluid to keep your pee clear or pale yellow. If you were told to drink an ORS, finish the ORS first, then start slowly drinking clear fluids. Drink fluids such as: ? Water. Do not drink only water by itself. Doing that can make the salt (sodium) level in your body get too low (hyponatremia). ? Ice chips. ? Fruit juice that you have added water to (diluted). ? Low-calorie sports drinks.  Avoid: ? Alcohol. ? Drinks that have a lot of sugar. These include high-calorie sports drinks, fruit juice that does not have water added, and soda. ? Caffeine. ? Foods that are greasy or have a lot of fat or sugar.  Take over-the-counter and prescription medicines only as told by your doctor.  Do not take salt tablets. Doing that can make the salt level in your body get too high (hypernatremia).  Eat foods that have minerals (electrolytes). Examples include bananas, oranges, potatoes, tomatoes, and spinach.  Keep all follow-up visits as told by your doctor. This is important. Contact a doctor if:  You have belly (abdominal) pain that: ? Gets worse. ? Stays in one area (localizes).  You have a rash.  You have a stiff neck.  You get angry or annoyed more easily than normal (irritability).  You are more sleepy than normal.  You have a harder time waking up than normal.  You feel: ? Weak. ? Dizzy. ? Very thirsty.  You have peed (urinated) only a small amount of very dark pee during 6-8  hours. Get help right away if:  You have symptoms of very bad dehydration.  You cannot drink fluids without throwing up (vomiting).  Your symptoms get worse with treatment.  You have a fever.  You have a very bad headache.  You are throwing up or having watery poop (diarrhea) and it: ? Gets worse. ? Does not go away.  You have blood or something green (bile) in your throw-up.  You have blood in your poop (stool). This may cause poop to look black and tarry.  You have not peed in 6-8 hours.  You pass out (faint).  Your heart rate when you are sitting still is more than 100 beats a minute.  You have trouble breathing. This information is not intended to replace advice given to you  by your health care provider. Make sure you discuss any questions you have with your health care provider. Document Released: 08/10/2009 Document Revised: 05/03/2016 Document Reviewed: 12/08/2015 Elsevier Interactive Patient Education  2019 [employer].

## 2019-01-13 LAB — CULTURE BLOOD MANUAL
MICRO NUMBER 7002: 307061
Micro Number: 307070
Result: NO GROWTH
Result: NO GROWTH

## 2019-01-28 ENCOUNTER — Other Ambulatory Visit: Payer: Self-pay | Admitting: Family Medicine

## 2019-02-01 ENCOUNTER — Telehealth: Payer: Self-pay

## 2019-02-01 MED ORDER — VITAMIN D 25 MCG (1000 UNIT) PO TABS: 1000.0000 [IU] | ORAL_TABLET | Freq: Two times a day (BID) | ORAL | 1 refills | Status: DC

## 2019-02-01 NOTE — Telephone Encounter (Signed)
Error

## 2019-03-17 ENCOUNTER — Other Ambulatory Visit: Payer: Self-pay | Admitting: Family Medicine

## 2019-03-24 ENCOUNTER — Other Ambulatory Visit: Payer: Self-pay

## 2019-03-24 ENCOUNTER — Ambulatory Visit (INDEPENDENT_AMBULATORY_CARE_PROVIDER_SITE_OTHER): Payer: Medicare Other | Admitting: Family Medicine

## 2019-03-24 VITALS — BP 126/82 | HR 92 | Temp 98.5°F | Resp 18 | Ht 69.0 in | Wt 171.8 lb

## 2019-03-24 DIAGNOSIS — R197 Diarrhea, unspecified: Secondary | ICD-10-CM

## 2019-03-24 DIAGNOSIS — Z720 Tobacco use: Secondary | ICD-10-CM | POA: Diagnosis not present

## 2019-03-24 DIAGNOSIS — B182 Chronic viral hepatitis C: Secondary | ICD-10-CM

## 2019-03-24 DIAGNOSIS — R634 Abnormal weight loss: Secondary | ICD-10-CM

## 2019-03-24 DIAGNOSIS — Z114 Encounter for screening for human immunodeficiency virus [HIV]: Secondary | ICD-10-CM

## 2019-03-24 DIAGNOSIS — K580 Irritable bowel syndrome with diarrhea: Secondary | ICD-10-CM | POA: Diagnosis not present

## 2019-03-24 DIAGNOSIS — J410 Simple chronic bronchitis: Secondary | ICD-10-CM | POA: Diagnosis not present

## 2019-03-24 MED ORDER — TAMSULOSIN HCL 0.4 MG PO CAPS: ORAL_CAPSULE | ORAL | 6 refills | Status: DC

## 2019-03-24 MED ORDER — OMEPRAZOLE 40 MG PO CPDR: 40.0000 mg | DELAYED_RELEASE_CAPSULE | Freq: Every day | ORAL | 6 refills | Status: DC | PRN

## 2019-03-24 NOTE — Patient Instructions (Addendum)
CT scan to be done - at [facility] F/U Monday FOR LAB VISIT

## 2019-03-24 NOTE — Progress Notes (Signed)
   Subjective:    Patient ID: Francis [name], male    DOB: [birth year], 48 y.o.   MRN: [id number]  Patient presents for Follow-up and Weight Loss (worried about losing weight and not trying too)  Using CPAP machine regulary for OSA   Here today for F/U he is concerned he is losing weight for no reason.   Weght was 190lbs ince Jan, down to 178 in march  24 hours recall- no breakfast, [employer] Quarter pounder meal- Lunch, [employer]es/Chips/ salsa Appetite is good , no pain with eating Diarrhea unchanged which is chronic He has not cut out any carbssweets, all unintetional weight loss  Has a physical job, walking and lifting a lot but has been working for > 1 year and nothing has changed at work  [employer] well otherwise He does feel this all started when he became sick in March  Non smoker   He does have history of Hep C treated, had neg HIV in Nov Denies any new partners   Review Of Systems:  GEN- denies fatigue, fever, +weight loss,weakness, recent illness HEENT- denies eye drainage, change in vision, nasal discharge, CVS- denies chest pain, palpitations RESP- denies SOB, cough, wheeze ABD- denies N/V, change in stools, abd pain GU- denies dysuria, hematuria, dribbling, incontinence MSK- denies joint pain, muscle aches, injury Neuro- denies headache, dizziness, syncope, seizure activity       Objective:    BP 126/82   Pulse 92   Temp 98.5 F (36.9 C)   Resp 18   Ht 5\' 9"  (1.753 m)   Wt 171 lb 12.8 oz (77.9 kg)   SpO2 95%   BMI 25.37 kg/m  GEN- NAD, alert and oriented x3 HEENT- PERRL, EOMI, non injected sclera, pink conjunctiva, MMM, oropharynx clear Neck- Supple, no thyromegaly CVS- RRR, no murmur RESP-CTAB ABD-NABS,soft,NT,ND EXT- No edema Pulses- Radial  2+        Assessment & Plan:      Problem List Items Addressed This Visit      Unprioritized   Chronic bronchitis ([place])    Weight loss is very concerning, especially in setting of good appetite, no  abd pain or other pain associated He is smoker, so risk for lung cancer Also has history Of Hep C oBTAIN CT of chest to r/u lung cancer with smoking history 20+pack years Check TSH, Hep C, HIV He did have negative PSA 6 months ago Repeat CMET       Chronic hepatitis C without hepatic coma (HCC)   IBS (irritable bowel syndrome)    IBS chronic diarrhea, but with weight loss Obtain CT of abd      Relevant Medications   omeprazole (PRILOSEC) 40 MG capsule   Tobacco user    Other Visit Diagnoses    Weight loss, unintentional    -  Primary      Note: This dictation was prepared with Dragon dictation along with smaller phrase technology. Any transcriptional errors that result from this process are unintentional.

## 2019-03-25 ENCOUNTER — Encounter: Payer: Self-pay | Admitting: Family Medicine

## 2019-03-25 NOTE — Assessment & Plan Note (Signed)
Weight loss is very concerning, especially in setting of good appetite, no abd pain or other pain associated He is smoker, so risk for lung cancer Also has history Of Hep C oBTAIN CT of chest to r/u lung cancer with smoking history 20+pack years Check TSH, Hep C, HIV He did have negative PSA 6 months ago Repeat CMET

## 2019-03-25 NOTE — Assessment & Plan Note (Signed)
IBS chronic diarrhea, but with weight loss Obtain CT of abd

## 2019-03-29 ENCOUNTER — Other Ambulatory Visit: Payer: Self-pay

## 2019-03-29 ENCOUNTER — Other Ambulatory Visit: Payer: Medicare Other

## 2019-03-29 DIAGNOSIS — Z114 Encounter for screening for human immunodeficiency virus [HIV]: Secondary | ICD-10-CM

## 2019-03-29 DIAGNOSIS — B182 Chronic viral hepatitis C: Secondary | ICD-10-CM

## 2019-03-29 DIAGNOSIS — R634 Abnormal weight loss: Secondary | ICD-10-CM

## 2019-03-30 LAB — CBC WITH DIFFERENTIAL/PLATELET
Absolute Monocytes: 410 cells/uL (ref 200–950)
Basophils Absolute: 59 cells/uL (ref 0–200)
Basophils Relative: 0.9 %
Eosinophils Absolute: 33 cells/uL (ref 15–500)
Eosinophils Relative: 0.5 %
HCT: 42.8 % (ref 38.5–50.0)
Hemoglobin: 14 g/dL (ref 13.2–17.1)
Lymphs Abs: 1404 cells/uL (ref 850–3900)
MCH: 28.1 pg (ref 27.0–33.0)
MCHC: 32.7 g/dL (ref 32.0–36.0)
MCV: 85.9 fL (ref 80.0–100.0)
MPV: 11.6 fL (ref 7.5–12.5)
Monocytes Relative: 6.3 %
Neutro Abs: 4596 cells/uL (ref 1500–7800)
Neutrophils Relative %: 70.7 %
Platelets: 197 10*3/uL (ref 140–400)
RBC: 4.98 10*6/uL (ref 4.20–5.80)
RDW: 12.9 % (ref 11.0–15.0)
Total Lymphocyte: 21.6 %
WBC: 6.5 10*3/uL (ref 3.8–10.8)

## 2019-03-30 LAB — COMPREHENSIVE METABOLIC PANEL
AG Ratio: 1.6 (calc) (ref 1.0–2.5)
ALT: 9 U/L (ref 9–46)
AST: 14 U/L (ref 10–40)
Albumin: 4.4 g/dL (ref 3.6–5.1)
Alkaline phosphatase (APISO): 67 U/L (ref 36–130)
BUN/Creatinine Ratio: 27 (calc) — ABNORMAL HIGH (ref 6–22)
BUN: 30 mg/dL — ABNORMAL HIGH (ref 7–25)
CO2: 25 mmol/L (ref 20–32)
Calcium: 10 mg/dL (ref 8.6–10.3)
Chloride: 108 mmol/L (ref 98–110)
Creat: 1.11 mg/dL (ref 0.60–1.35)
Globulin: 2.7 g/dL (calc) (ref 1.9–3.7)
Glucose, Bld: 119 mg/dL — ABNORMAL HIGH (ref 65–99)
Potassium: 4.3 mmol/L (ref 3.5–5.3)
Sodium: 142 mmol/L (ref 135–146)
Total Bilirubin: 0.5 mg/dL (ref 0.2–1.2)
Total Protein: 7.1 g/dL (ref 6.1–8.1)

## 2019-03-30 LAB — TSH: TSH: 0.57 mIU/L (ref 0.40–4.50)

## 2019-03-30 LAB — HIV ANTIBODY (ROUTINE TESTING W REFLEX): HIV 1&2 Ab, 4th Generation: NONREACTIVE

## 2019-04-05 LAB — HEPATITIS C RNA QUANTITATIVE
HCV Quantitative Log: <1.18 Log IU/mL
HCV RNA, PCR, QN: <15 IU/mL

## 2019-04-16 ENCOUNTER — Ambulatory Visit (HOSPITAL_COMMUNITY): Payer: Medicare Other

## 2019-06-17 ENCOUNTER — Other Ambulatory Visit: Payer: Self-pay | Admitting: Family Medicine

## 2019-09-02 ENCOUNTER — Encounter (HOSPITAL_COMMUNITY): Payer: Self-pay | Admitting: Emergency Medicine

## 2019-09-02 ENCOUNTER — Emergency Department (HOSPITAL_COMMUNITY): Payer: Medicare Other

## 2019-09-02 ENCOUNTER — Other Ambulatory Visit: Payer: Self-pay

## 2019-09-02 ENCOUNTER — Emergency Department (HOSPITAL_COMMUNITY)
Admission: EM | Admit: 2019-09-02 | Discharge: 2019-09-02 | Disposition: A | Discharge status: Home / Self Care | Payer: Medicare Other | Attending: Emergency Medicine | Admitting: Emergency Medicine

## 2019-09-02 DIAGNOSIS — R197 Diarrhea, unspecified: Secondary | ICD-10-CM | POA: Diagnosis not present

## 2019-09-02 DIAGNOSIS — K529 Noninfective gastroenteritis and colitis, unspecified: Secondary | ICD-10-CM | POA: Insufficient documentation

## 2019-09-02 DIAGNOSIS — R109 Unspecified abdominal pain: Secondary | ICD-10-CM | POA: Diagnosis present

## 2019-09-02 DIAGNOSIS — F17210 Nicotine dependence, cigarettes, uncomplicated: Secondary | ICD-10-CM | POA: Diagnosis not present

## 2019-09-02 DIAGNOSIS — Z79899 Other long term (current) drug therapy: Secondary | ICD-10-CM | POA: Insufficient documentation

## 2019-09-02 DIAGNOSIS — K6389 Other specified diseases of intestine: Secondary | ICD-10-CM | POA: Diagnosis not present

## 2019-09-02 LAB — URINALYSIS, ROUTINE W REFLEX MICROSCOPIC
Bacteria, UA: NONE SEEN
Bilirubin Urine: NEGATIVE
Glucose, UA: NEGATIVE mg/dL
Ketones, ur: NEGATIVE mg/dL
Leukocytes,Ua: NEGATIVE
Nitrite: NEGATIVE
Protein, ur: 30 mg/dL — AB
Specific Gravity, Urine: 1.027 (ref 1.005–1.030)
pH: 5 (ref 5.0–8.0)

## 2019-09-02 LAB — C DIFFICILE QUICK SCREEN W PCR REFLEX
C Diff antigen: NEGATIVE
C Diff interpretation: NOT DETECTED
C Diff toxin: NEGATIVE

## 2019-09-02 LAB — COMPREHENSIVE METABOLIC PANEL
ALT: 12 U/L (ref 0–44)
AST: 16 U/L (ref 15–41)
Albumin: 4.3 g/dL (ref 3.5–5.0)
Alkaline Phosphatase: 77 U/L (ref 38–126)
Anion gap: 11 (ref 5–15)
BUN: 27 mg/dL — ABNORMAL HIGH (ref 6–20)
CO2: 24 mmol/L (ref 22–32)
Calcium: 9.1 mg/dL (ref 8.9–10.3)
Chloride: 103 mmol/L (ref 98–111)
Creatinine, Ser: 1.02 mg/dL (ref 0.61–1.24)
GFR calc Af Amer: >60 mL/min (ref >60)
GFR calc non Af Amer: >60 mL/min (ref >60)
Glucose, Bld: 102 mg/dL — ABNORMAL HIGH (ref 70–99)
Potassium: 3.9 mmol/L (ref 3.5–5.1)
Sodium: 138 mmol/L (ref 135–145)
Total Bilirubin: 1.2 mg/dL (ref 0.3–1.2)
Total Protein: 7.4 g/dL (ref 6.5–8.1)

## 2019-09-02 LAB — CBC
HCT: 44.2 % (ref 39.0–52.0)
Hemoglobin: 14.2 g/dL (ref 13.0–17.0)
MCH: 27.6 pg (ref 26.0–34.0)
MCHC: 32.1 g/dL (ref 30.0–36.0)
MCV: 85.8 fL (ref 80.0–100.0)
Platelets: 152 10*3/uL (ref 150–400)
RBC: 5.15 MIL/uL (ref 4.22–5.81)
RDW: 14.3 % (ref 11.5–15.5)
WBC: 7.2 10*3/uL (ref 4.0–10.5)
nRBC: 0 % (ref 0.0–0.2)

## 2019-09-02 LAB — LIPASE, BLOOD: Lipase: 42 U/L (ref 11–51)

## 2019-09-02 MED ORDER — SODIUM CHLORIDE 0.9 % IV BOLUS
1000.0000 mL | Freq: Once | INTRAVENOUS | Status: AC
  Administered 2019-09-02: 1000 mL via INTRAVENOUS

## 2019-09-02 MED ORDER — IOHEXOL 9 MG/ML PO SOLN: 500.0000 mL | ORAL | Status: AC

## 2019-09-02 MED ORDER — IOHEXOL 300 MG/ML  SOLN
100.0000 mL | Freq: Once | INTRAMUSCULAR | Status: AC | PRN
  Administered 2019-09-02: 100 mL via INTRAVENOUS

## 2019-09-02 MED ORDER — ONDANSETRON 4 MG PO TBDP: 4.0000 mg | ORAL_TABLET | Freq: Three times a day (TID) | ORAL | 0 refills | Status: DC | PRN

## 2019-09-02 MED ORDER — IOHEXOL 9 MG/ML PO SOLN
ORAL | Status: AC
  Filled 2019-09-02: qty 1000

## 2019-09-02 NOTE — Discharge Instructions (Signed)
Take Zofran as needed as prescribed for nausea. Bland diet (bananas, rice, applesauce, dry toast) and advance slowly as tolerated.  Follow up with your doctor if symptoms continue.
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
## 2019-09-02 NOTE — ED Triage Notes (Signed)
Pt states that he has been having abd pain with diarrhea since Monday. He has had chills no appointe since Monday.

## 2019-09-03 LAB — GASTROINTESTINAL PANEL BY PCR, STOOL (REPLACES STOOL CULTURE)

## 2019-09-03 NOTE — ED Provider Notes (Signed)
Stool studies showing campylobacter. Pt called. Still feeling "rough." Azithromycin called into [facility] on [address] Rushie Chestnut, MD 09/03/19 [phone]

## 2019-09-13 ENCOUNTER — Other Ambulatory Visit: Payer: Self-pay

## 2019-09-14 ENCOUNTER — Encounter: Payer: Self-pay | Admitting: Family Medicine

## 2019-09-14 ENCOUNTER — Ambulatory Visit (INDEPENDENT_AMBULATORY_CARE_PROVIDER_SITE_OTHER): Payer: Medicare Other | Admitting: Family Medicine

## 2019-09-14 VITALS — BP 128/64 | HR 90 | Temp 98.5°F | Resp 14 | Ht 69.0 in | Wt 166.0 lb

## 2019-09-14 DIAGNOSIS — K529 Noninfective gastroenteritis and colitis, unspecified: Secondary | ICD-10-CM | POA: Diagnosis not present

## 2019-09-14 DIAGNOSIS — K219 Gastro-esophageal reflux disease without esophagitis: Secondary | ICD-10-CM | POA: Diagnosis not present

## 2019-09-14 DIAGNOSIS — F411 Generalized anxiety disorder: Secondary | ICD-10-CM | POA: Diagnosis not present

## 2019-09-14 DIAGNOSIS — K2950 Unspecified chronic gastritis without bleeding: Secondary | ICD-10-CM | POA: Diagnosis not present

## 2019-09-14 MED ORDER — BACLOFEN 10 MG PO TABS: 10.0000 mg | ORAL_TABLET | Freq: Three times a day (TID) | ORAL | 3 refills | Status: DC | PRN

## 2019-09-14 MED ORDER — OMEPRAZOLE 40 MG PO CPDR: 40.0000 mg | DELAYED_RELEASE_CAPSULE | Freq: Every day | ORAL | 6 refills | Status: DC | PRN

## 2019-09-14 NOTE — Assessment & Plan Note (Signed)
Patient with pancolitis also some evidence of gastritis.  I think he still needs further work-up by gastroenterology he has had chronic diarrhea for many years. He was treated for the Campylobacter he is not sure where he may have picked that up.  The azithromycin did help bring him back to his typical diarrhea.  Interesting the baclofen is helpful I think because there is significant anxiety component to his IBS and the muscle relaxer is taking a different approach from his typical Bentyl hydroxyzine Imodium which he has been on for years.  He is also been on other antianxiety medications in the past.  This point I will keep him on the baclofen until he is seen by gastroenterology.  He will also continue his omeprazole for the gastritis and GERD

## 2019-09-14 NOTE — Assessment & Plan Note (Signed)
He has been on a few different medications for his anxiety.  He feels the baclofen works best.  I do query underlying placebo effect the right now it is helping control his symptoms though he will continue the medication.  I prescribed this up to 3 times a day as needed.

## 2019-09-14 NOTE — Assessment & Plan Note (Signed)
Chronic GERD along with gastritis noted on CT scan

## 2019-09-14 NOTE — Progress Notes (Signed)
Subjective:    Patient ID: Francis [name], male    DOB: [birth year], 48 y.o.   MRN: [id number]  Patient presents for GI Issues (has F/U with GI in March 2021- dx: colitis- severe diarrhea)  Patient here to discuss his chronic diarrhea.  He has known IBS with diarrhea.  He has multiple episodes a day for many years.  He has been seen by gastroenterology.  He has had weight loss associated with his diarrhea. He was seen in emergency room recently on November 5 with this abdominal pain and diarrhea.  His CT scan showed diffuse wall thickening of the colon  ICT ABD= MPRESSION: 1. Diffuse wall thickening of the colon consistent with pancolitis which may be secondary to infection, to include C difficile colitis in the appropriate clinical setting, inflammatory bowel disease, or less likely ischemia 2. Stomach is decompressed with borderline wall thickening, questionable for gastritis. 3. Slightly enlarged spleen  He has stool studies sent from the emergency room that came back positive for Campylobacter azithromycin was called in by ER, he took for 3 days and diarrhea improved. He also states he was having muscle spasms in his back, he took baclofen from a friend  And this actually helped his back and his bowels. He knows his anxiety makes his chronic IBS-D symptoms worse , since he took the baclofen which he has been taking up to 3 times a day he feels more relaxed his diarrhea is better he is not using the Bentyl and has not used as much Imodium.  He also has cut back on the Aleve as there was concern for gastritis on the imaging as well.  He still wants to proceed with gastroenterology work-up C diff was negative  CBC/Metabolic were unremarkable   Weight down 5lbs since his visit in May - note he had neg HIV testing, Hep C/ TSH    Review Of Systems:  GEN- denies fatigue, fever, +weight loss,weakness, recent illness HEENT- denies eye drainage, change in vision, nasal discharge, CVS-  denies chest pain, palpitations RESP- denies SOB, cough, wheeze ABD- denies N/V, +change in stools,+ abd pain GU- denies dysuria, hematuria, dribbling, incontinence MSK- denies joint pain, muscle aches, injury Neuro- denies headache, dizziness, syncope, seizure activity       Objective:    BP 128/64   Pulse 90   Temp 98.5 F (36.9 C) (Temporal)   Resp 14   Ht 5\' 9"  (1.753 m)   Wt 166 lb (75.3 kg)   SpO2 97%   BMI 24.51 kg/m  GEN- NAD, alert and oriented x3 HEENT- PERRL, EOMI, non injected sclera, pink conjunctiva, MMM, oropharynx clear Neck- Supple, no thyromegaly CVS- RRR, no murmur RESP-CTAB ABD-NABS,soft,NT,ND EXT- No edema Pulses- Radial, DP- 2+        Assessment & Plan:      Problem List Items Addressed This Visit      Unprioritized   Chronic diarrhea   Colitis    Patient with pancolitis also some evidence of gastritis.  I think he still needs further work-up by gastroenterology he has had chronic diarrhea for many years. He was treated for the Campylobacter he is not sure where he may have picked that up.  The azithromycin did help bring him back to his typical diarrhea.  Interesting the baclofen is helpful I think because there is significant anxiety component to his IBS and the muscle relaxer is taking a different approach from his typical Bentyl hydroxyzine Imodium which he has been on for  years.  He is also been on other antianxiety medications in the past.  This point I will keep him on the baclofen until he is seen by gastroenterology.  He will also continue his omeprazole for the gastritis and GERD      GAD (generalized anxiety disorder) - Primary    He has been on a few different medications for his anxiety.  He feels the baclofen works best.  I do query underlying placebo effect the right now it is helping control his symptoms though he will continue the medication.  I prescribed this up to 3 times a day as needed.      GERD (gastroesophageal reflux  disease)    Chronic GERD along with gastritis noted on CT scan      Relevant Medications   omeprazole (PRILOSEC) 40 MG capsule    Other Visit Diagnoses    Chronic gastritis without bleeding, unspecified gastritis type       D/C NSAIDS      Note: This dictation was prepared with Dragon dictation along with smaller phrase technology. Any transcriptional errors that result from this process are unintentional.

## 2019-09-14 NOTE — Patient Instructions (Signed)
F/U 4 months Referral to GI- Dr. Laural Golden

## 2019-12-23 ENCOUNTER — Other Ambulatory Visit: Payer: Self-pay | Admitting: Family Medicine

## 2019-12-23 NOTE — Telephone Encounter (Signed)
Ok to refill 

## 2020-01-12 ENCOUNTER — Ambulatory Visit: Payer: Medicare Other | Admitting: Family Medicine

## 2020-01-14 ENCOUNTER — Encounter: Payer: Self-pay | Admitting: Family Medicine

## 2020-01-14 ENCOUNTER — Ambulatory Visit (INDEPENDENT_AMBULATORY_CARE_PROVIDER_SITE_OTHER): Payer: Medicare Other | Admitting: Family Medicine

## 2020-01-14 ENCOUNTER — Other Ambulatory Visit: Payer: Self-pay

## 2020-01-14 VITALS — BP 120/64 | HR 90 | Temp 98.0°F | Resp 14 | Ht 69.0 in | Wt 180.0 lb

## 2020-01-14 DIAGNOSIS — F411 Generalized anxiety disorder: Secondary | ICD-10-CM | POA: Diagnosis not present

## 2020-01-14 DIAGNOSIS — K580 Irritable bowel syndrome with diarrhea: Secondary | ICD-10-CM | POA: Diagnosis not present

## 2020-01-14 DIAGNOSIS — K529 Noninfective gastroenteritis and colitis, unspecified: Secondary | ICD-10-CM | POA: Diagnosis not present

## 2020-01-14 MED ORDER — BACLOFEN 20 MG PO TABS: 20.0000 mg | ORAL_TABLET | Freq: Three times a day (TID) | ORAL | 6 refills | Status: DC

## 2020-01-14 NOTE — Progress Notes (Signed)
   Subjective:    Patient ID: Francis [name], male    DOB: 1971-06-14, 49 y.o.   MRN: [id number]  Patient presents for Medication Review (Baclofen)  Pt here to f/u chronic medical problems. Medications reviewed  at last visit in Nov, he was given baclofen to help with his IBS symptoms as well as his anxiety.  This is helped tremendously.  He has noticed sometimes he takes an extra once a day and taking 20 mg once some days and this seems to work the best.  He states that all the different medication combinations he has been on the baclofen is work the most.  He is not afraid to go out in public due to his bowel issues.  He also does not get the anxiety attacks.  He would like to increase the baclofen to 20 mg 3 times a day.  He is still taking omeprazole for his stomach that controls his reflux symptoms.  He is due to see GI to discuss colonoscopy and biopsy based on abnormal CT scan and his previous symptoms.  Of note he has not required Bentyl or Imodium since our last visit     Review Of Systems:  GEN- denies fatigue, fever, weight loss,weakness, recent illness HEENT- denies eye drainage, change in vision, nasal discharge, CVS- denies chest pain, palpitations RESP- denies SOB, cough, wheeze ABD- denies N/V, change in stools, abd pain GU- denies dysuria, hematuria, dribbling, incontinence MSK- denies joint pain, muscle aches, injury Neuro- denies headache, dizziness, syncope, seizure activity       Objective:    BP 120/64   Pulse 90   Temp 98 F (36.7 C) (Temporal)   Resp 14   Ht 5\' 9"  (1.753 m)   Wt 180 lb (81.6 kg)   SpO2 100%   BMI 26.58 kg/m  GEN- NAD, alert and oriented x3 HEENT- PERRL, EOMI, non injected sclera,  CVS- RRR, no murmur RESP-CTAB ABD-NABS,soft,NT,ND Psych- normal affect and mood  EXT- No edema Pulses- Radial, DP- 2+        Assessment & Plan:      Problem List Items Addressed This Visit      Unprioritized   Chronic diarrhea   GAD  (generalized anxiety disorder)   IBS (irritable bowel syndrome) - Primary    IBS ,chronic diarrhea Improved with baclofen, has tried multiple other med combinations He did have abnormal CT so will still followup with GI, colonoscopy needed Continue baclofen this helps the anxiety and GI components, changed to 20mg  TID for now          Note: This dictation was prepared with Dragon dictation along with smaller phrase technology. Any transcriptional errors that result from this process are unintentional.

## 2020-01-14 NOTE — Patient Instructions (Addendum)
F/U 4 months for Physical  

## 2020-01-16 ENCOUNTER — Encounter: Payer: Self-pay | Admitting: Family Medicine

## 2020-01-16 NOTE — Assessment & Plan Note (Signed)
IBS ,chronic diarrhea Improved with baclofen, has tried multiple other med combinations He did have abnormal CT so will still followup with GI, colonoscopy needed Continue baclofen this helps the anxiety and GI components, changed to 20mg  TID for now

## 2020-01-18 ENCOUNTER — Ambulatory Visit (INDEPENDENT_AMBULATORY_CARE_PROVIDER_SITE_OTHER): Payer: Medicare Other | Admitting: Internal Medicine

## 2020-01-18 ENCOUNTER — Other Ambulatory Visit: Payer: Self-pay

## 2020-01-18 ENCOUNTER — Encounter (INDEPENDENT_AMBULATORY_CARE_PROVIDER_SITE_OTHER): Payer: Self-pay | Admitting: Internal Medicine

## 2020-01-18 VITALS — BP 127/84 | HR 90 | Temp 97.3°F | Ht 69.0 in | Wt 181.2 lb

## 2020-01-18 DIAGNOSIS — K219 Gastro-esophageal reflux disease without esophagitis: Secondary | ICD-10-CM

## 2020-01-18 DIAGNOSIS — K580 Irritable bowel syndrome with diarrhea: Secondary | ICD-10-CM | POA: Diagnosis not present

## 2020-01-18 NOTE — Patient Instructions (Signed)
Please call office if diarrhea recurs.
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
## 2020-05-15 ENCOUNTER — Encounter: Payer: Medicare Other | Admitting: Family Medicine

## 2020-06-09 ENCOUNTER — Encounter: Payer: Self-pay | Admitting: Emergency Medicine

## 2020-06-09 ENCOUNTER — Ambulatory Visit
Admission: EM | Admit: 2020-06-09 | Discharge: 2020-06-09 | Disposition: A | Discharge status: Home / Self Care | Payer: Medicare Other | Attending: Emergency Medicine | Admitting: Emergency Medicine

## 2020-06-09 ENCOUNTER — Other Ambulatory Visit: Payer: Self-pay

## 2020-06-09 DIAGNOSIS — Z20822 Contact with and (suspected) exposure to covid-19: Secondary | ICD-10-CM

## 2020-06-09 NOTE — ED Triage Notes (Signed)
Exposed to covid + person on Tuesday. Pt is having no s/s

## 2020-06-10 LAB — NOVEL CORONAVIRUS, NAA: SARS-CoV-2, NAA: NOT DETECTED

## 2020-06-10 LAB — SARS-COV-2, NAA 2 DAY TAT

## 2020-07-22 ENCOUNTER — Ambulatory Visit
Admission: EM | Admit: 2020-07-22 | Discharge: 2020-07-22 | Disposition: A | Discharge status: Home / Self Care | Payer: Medicare Other | Attending: Emergency Medicine | Admitting: Emergency Medicine

## 2020-07-22 ENCOUNTER — Other Ambulatory Visit: Payer: Self-pay

## 2020-07-22 DIAGNOSIS — Z1152 Encounter for screening for COVID-19: Secondary | ICD-10-CM | POA: Diagnosis not present

## 2020-07-22 NOTE — ED Triage Notes (Signed)
Exposed to covid positive person. No s/s

## 2020-07-24 LAB — NOVEL CORONAVIRUS, NAA: SARS-CoV-2, NAA: NOT DETECTED

## 2020-07-24 LAB — SARS-COV-2, NAA 2 DAY TAT

## 2020-07-25 ENCOUNTER — Ambulatory Visit (INDEPENDENT_AMBULATORY_CARE_PROVIDER_SITE_OTHER): Payer: Medicare Other | Admitting: Internal Medicine

## 2020-08-17 ENCOUNTER — Encounter (INDEPENDENT_AMBULATORY_CARE_PROVIDER_SITE_OTHER): Payer: Self-pay | Admitting: Gastroenterology

## 2020-08-17 ENCOUNTER — Other Ambulatory Visit: Payer: Self-pay

## 2020-08-17 ENCOUNTER — Ambulatory Visit (INDEPENDENT_AMBULATORY_CARE_PROVIDER_SITE_OTHER): Payer: Medicare Other | Admitting: Gastroenterology

## 2020-08-17 VITALS — BP 135/88 | HR 108 | Temp 99.0°F | Ht 69.0 in | Wt 161.1 lb

## 2020-08-17 DIAGNOSIS — K589 Irritable bowel syndrome without diarrhea: Secondary | ICD-10-CM

## 2020-08-17 DIAGNOSIS — K219 Gastro-esophageal reflux disease without esophagitis: Secondary | ICD-10-CM
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
## 2020-08-17 NOTE — Patient Instructions (Signed)
Monitor weight - please notify me if continues to loose weight or do not regain. Continue current medications as discussed.

## 2020-08-22 ENCOUNTER — Other Ambulatory Visit: Payer: Self-pay | Admitting: Family Medicine

## 2020-08-30 ENCOUNTER — Encounter: Payer: Self-pay | Admitting: Family Medicine

## 2020-08-30 ENCOUNTER — Other Ambulatory Visit: Payer: Self-pay

## 2020-08-30 ENCOUNTER — Ambulatory Visit (INDEPENDENT_AMBULATORY_CARE_PROVIDER_SITE_OTHER): Payer: Medicare Other | Admitting: Family Medicine

## 2020-08-30 VITALS — BP 112/80 | HR 98 | Temp 98.2°F | Resp 14 | Ht 69.0 in | Wt 162.0 lb

## 2020-08-30 DIAGNOSIS — Z0000 Encounter for general adult medical examination without abnormal findings: Secondary | ICD-10-CM

## 2020-08-30 DIAGNOSIS — Z0001 Encounter for general adult medical examination with abnormal findings: Secondary | ICD-10-CM | POA: Diagnosis not present

## 2020-08-30 DIAGNOSIS — Z8619 Personal history of other infectious and parasitic diseases: Secondary | ICD-10-CM | POA: Diagnosis not present

## 2020-08-30 DIAGNOSIS — Z13220 Encounter for screening for lipoid disorders: Secondary | ICD-10-CM | POA: Diagnosis not present

## 2020-08-30 DIAGNOSIS — R634 Abnormal weight loss: Secondary | ICD-10-CM | POA: Diagnosis not present

## 2020-08-30 DIAGNOSIS — K219 Gastro-esophageal reflux disease without esophagitis: Secondary | ICD-10-CM

## 2020-08-30 DIAGNOSIS — F17200 Nicotine dependence, unspecified, uncomplicated: Secondary | ICD-10-CM | POA: Diagnosis not present

## 2020-08-30 DIAGNOSIS — Z125 Encounter for screening for malignant neoplasm of prostate: Secondary | ICD-10-CM

## 2020-08-30 DIAGNOSIS — Z136 Encounter for screening for cardiovascular disorders: Secondary | ICD-10-CM | POA: Diagnosis not present

## 2020-08-30 DIAGNOSIS — K580 Irritable bowel syndrome with diarrhea: Secondary | ICD-10-CM

## 2020-08-30 MED ORDER — OMEPRAZOLE 40 MG PO CPDR: 40.0000 mg | DELAYED_RELEASE_CAPSULE | Freq: Every day | ORAL | 1 refills | Status: DC | PRN

## 2020-08-30 MED ORDER — BACLOFEN 20 MG PO TABS: ORAL_TABLET | ORAL | 1 refills | Status: DC

## 2020-08-30 NOTE — Patient Instructions (Signed)
F/u 6 months

## 2020-08-30 NOTE — Progress Notes (Signed)
Subjective:   Patient presents for Medicare Annual/Subsequent preventive examination.   Pt here for CPE IBS with diarrhea- he is doing well baclofen   GERD- taking protonix as needed, does not need every day   His weight is down 20lbs since march He has a good appetite He is not trying to lose weight  Recent visit with gastroenterology but they recommended he does follow-up with me for the weight loss no particular symptoms associated with the weight loss.  Still smoking 1/2 ppd       Review Past Medical/Family/Social:   Risk Factors  Current exercise habits: walks, stays active  Dietary issues discussed: WEIGHT LOSS PER ABOVE   Cardiac risk factors:smoker, family history   Depression Screen  (Note: if answer to either of the following is "Yes", a more complete depression screening is indicated)  Over the past two weeks, have you felt down, depressed or hopeless? No Over the past two weeks, have you felt little interest or pleasure in doing things? No Have you lost interest or pleasure in daily life? No Do you often feel hopeless? No Do you cry easily over simple problems? No   Activities of Daily Living  In your present state of health, do you have any difficulty performing the following activities?:  Driving? No  Managing money? No  Feeding yourself? No  Getting from bed to chair? No  Climbing a flight of stairs? No  Preparing food and eating?: No  Bathing or showering? No  Getting dressed: No  Getting to the toilet? No  Using the toilet:No  Moving around from place to place: No  In the past year have you fallen or had a near fall?:No  Are you sexually active? Yes, same sex partner  Do you have more than one partner? No   Hearing Difficulties: No  Do you often ask people to speak up or repeat themselves? No  Do you experience ringing or noises in your ears? No Do you have difficulty understanding soft or whispered voices? No  Do you feel that you have a  problem with memory? No Do you often misplace items? No  Do you feel safe at home? Yes  Cognitive Testing  Alert? Yes Normal Appearance?Yes  Oriented to person? Yes Place? Yes  Time? Yes  Recall of three objects? Yes  Can perform simple calculations? Yes  Displays appropriate judgment?Yes  Can read the correct time from a watch face?Yes   List the Names of Other Physician/Practitioners you currently use: GI     Screening Tests / Date Declines vaccines    Assessment:    Annual wellness medicare exam   Plan:    During the course of the visit the patient was educated and counseled about appropriate screening and preventive services including:   Wellness  exam completed.  He declines vaccinations.   IBS continue baclofen working well  GERD prn PPI  WEight loss unintentional, check labs, TSH, cancer screening CXR due to long term smoker, PSA, has history of BPH and HIV screening  Add more protein, in past increasing calories he was able to regain weight   History of Hep C but cleared infection  F/U pending results                Diet review for nutrition referral? Yes ____ Not Indicated __x__  Patient Instructions (the written plan) was given to the patient.  Medicare Attestation  I have personally reviewed:  The patient's medical and social history  Their use of alcohol, tobacco or illicit drugs  Their current medications and supplements  The patient's functional ability including ADLs,fall risks, home safety risks, cognitive, and hearing and visual impairment  Diet and physical activities  Evidence for depression or mood disorders  The patient's weight, height, BMI, and visual acuity have been recorded in the chart. I have made referrals, counseling, and provided education to the patient based on review of the above and I have provided the patient with a written personalized care plan for preventive services.

## 2020-08-31 LAB — COMPREHENSIVE METABOLIC PANEL
AG Ratio: 2.1 (calc) (ref 1.0–2.5)
ALT: 10 U/L (ref 9–46)
AST: 14 U/L (ref 10–40)
Albumin: 4.9 g/dL (ref 3.6–5.1)
Alkaline phosphatase (APISO): 62 U/L (ref 36–130)
BUN: 20 mg/dL (ref 7–25)
CO2: 25 mmol/L (ref 20–32)
Calcium: 10.1 mg/dL (ref 8.6–10.3)
Chloride: 106 mmol/L (ref 98–110)
Creat: 1.01 mg/dL (ref 0.60–1.35)
Globulin: 2.3 g/dL (calc) (ref 1.9–3.7)
Glucose, Bld: 89 mg/dL (ref 65–99)
Potassium: 4 mmol/L (ref 3.5–5.3)
Sodium: 143 mmol/L (ref 135–146)
Total Bilirubin: 0.6 mg/dL (ref 0.2–1.2)
Total Protein: 7.2 g/dL (ref 6.1–8.1)

## 2020-08-31 LAB — LIPID PANEL
Cholesterol: 180 mg/dL (ref <200)
HDL: 45 mg/dL (ref >=40)
LDL Cholesterol (Calc): 118 mg/dL (calc) — ABNORMAL HIGH
Non-HDL Cholesterol (Calc): 135 mg/dL (calc) — ABNORMAL HIGH (ref <130)
Total CHOL/HDL Ratio: 4 (calc) (ref <5.0)
Triglycerides: 75 mg/dL (ref <150)

## 2020-08-31 LAB — TSH: TSH: 0.89 mIU/L (ref 0.40–4.50)

## 2020-08-31 LAB — CBC WITH DIFFERENTIAL/PLATELET
Absolute Monocytes: 370 cells/uL (ref 200–950)
Basophils Absolute: 59 cells/uL (ref 0–200)
Basophils Relative: 0.8 %
Eosinophils Absolute: 7 cells/uL — ABNORMAL LOW (ref 15–500)
Eosinophils Relative: 0.1 %
HCT: 41.9 % (ref 38.5–50.0)
Hemoglobin: 14.2 g/dL (ref 13.2–17.1)
Lymphs Abs: 1206 cells/uL (ref 850–3900)
MCH: 28.7 pg (ref 27.0–33.0)
MCHC: 33.9 g/dL (ref 32.0–36.0)
MCV: 84.6 fL (ref 80.0–100.0)
MPV: 11.6 fL (ref 7.5–12.5)
Monocytes Relative: 5 %
Neutro Abs: 5757 cells/uL (ref 1500–7800)
Neutrophils Relative %: 77.8 %
Platelets: 196 10*3/uL (ref 140–400)
RBC: 4.95 10*6/uL (ref 4.20–5.80)
RDW: 12.8 % (ref 11.0–15.0)
Total Lymphocyte: 16.3 %
WBC: 7.4 10*3/uL (ref 3.8–10.8)

## 2020-08-31 LAB — PSA: PSA: 1.77 ng/mL (ref <=4.0)

## 2020-09-02 LAB — HIV ANTIBODY (ROUTINE TESTING W REFLEX): HIV 1&2 Ab, 4th Generation: NONREACTIVE

## 2020-09-02 LAB — HEPATITIS C RNA QUANTITATIVE
HCV RNA, PCR, QN (Log): <1.18 log IU/mL
HCV RNA, PCR, QN: <15 IU/mL

## 2020-10-23 ENCOUNTER — Other Ambulatory Visit: Payer: Self-pay | Admitting: Family Medicine

## 2020-10-23 NOTE — Telephone Encounter (Signed)
Ok to refill 

## 2020-10-31 ENCOUNTER — Other Ambulatory Visit: Payer: Self-pay

## 2020-10-31 ENCOUNTER — Ambulatory Visit (INDEPENDENT_AMBULATORY_CARE_PROVIDER_SITE_OTHER): Payer: Medicare Other | Admitting: Family Medicine

## 2020-10-31 ENCOUNTER — Encounter: Payer: Self-pay | Admitting: Family Medicine

## 2020-10-31 VITALS — BP 158/90 | HR 98 | Temp 98.1°F | Resp 16 | Ht 69.0 in | Wt 156.0 lb

## 2020-10-31 DIAGNOSIS — R000 Tachycardia, unspecified: Secondary | ICD-10-CM | POA: Diagnosis not present

## 2020-10-31 DIAGNOSIS — R6881 Early satiety: Secondary | ICD-10-CM

## 2020-10-31 DIAGNOSIS — K529 Noninfective gastroenteritis and colitis, unspecified: Secondary | ICD-10-CM

## 2020-10-31 DIAGNOSIS — R634 Abnormal weight loss: Secondary | ICD-10-CM | POA: Diagnosis not present

## 2020-10-31 DIAGNOSIS — Z8719 Personal history of other diseases of the digestive system: Secondary | ICD-10-CM

## 2020-10-31 MED ORDER — MIRTAZAPINE 15 MG PO TABS: 15.0000 mg | ORAL_TABLET | Freq: Every day | ORAL | 2 refills | Status: DC

## 2020-10-31 MED ORDER — METOPROLOL SUCCINATE ER 25 MG PO TB24: ORAL_TABLET | ORAL | 3 refills | Status: DC

## 2020-10-31 NOTE — Progress Notes (Signed)
Subjective:    Patient ID: Francis [name], male    DOB: 04-May-1971, 50 y.o.   MRN: [id number]  Patient presents for  Weight Loss (Continue to loose weight, tachycardia) Here due to ongoing weight loss.  He was seen back in November due to weight loss.  He has had history of this in the past but also increase his calories and weight would improve.  However since March of this year his weight is down 25 pounds.  He states that his appetite is so-so he does get full very quickly.  He has chronic IBS-like symptoms which he does see GI for.  He is on baclofen for this as no other medications have helped.  He denies any nausea vomiting.  He denies any pain anywhere.  He has noticed that his heart rate continues to go up which he has had as well throughout the years but he denies any chest pain or shortness of breath.  He does have a history of smoking at our last visit we discussed getting a chest x-ray but there was some confusion on when he was supposed to get this done.  He had labs done including HIV and hepatitis C which were negative 2 months ago.  Today his weight is down another 6 pounds from his November visit.  He feels like his muscle is wasting and he does notice some weakness in his muscles.. Dates that he is able to do his regular activities and exert himself normally occasionally he feels his heart racing but typically he does not  He states that mentally he feels like he is doing okay he does get stressed when he sees his weight continues to go down but he does not feel overly anxious or depressed.  Review Of Systems:  GEN- denies fatigue, fever, +weight loss,weakness, recent illness HEENT- denies eye drainage, change in vision, nasal discharge, CVS- denies chest pain, palpitations RESP- denies SOB, cough, wheeze ABD- denies N/V, change in stools, abd pain GU- denies dysuria, hematuria, dribbling, incontinence MSK- denies joint pain, muscle aches, injury Neuro- denies headache,  dizziness, syncope, seizure activity       Objective:    BP (!) 158/90   Pulse 98   Temp 98.1 F (36.7 C) (Temporal)   Resp 16   Ht 5\' 9"  (1.753 m)   Wt 156 lb (70.8 kg)   SpO2 98%   BMI 23.04 kg/m  GEN- NAD, alert and oriented x3, repeat blood pressure 180/90 HEENT- PERRL, EOMI, non injected sclera, pink conjunctiva, MMM, oropharynx clear Neck- Supple, no thyromegaly CVS-tachycardia regular rate no murmur RESP-CTAB ABD-NABS,soft,NT,ND Psych mild anxiety not depressed appearing EXT- No edema Pulses- Radial, DP- 2+  EKG sinus tachycardia mild atrial enlargement      Assessment & Plan:      Problem List Items Addressed This Visit      Unprioritized   Chronic diarrhea    Continue baclofen, has history of colitis as well       Relevant Orders   CT Abdomen Pelvis W Contrast   Tachycardia - Primary    Recurrent episodes, was on meds in past for this He didn't tolerate higher doses of propranolol Will try Toprol  12.5mg  as bp also elevated today  He has some underlying anxiety, worsened by the unknown reason for weight loss       Relevant Orders   EKG 12-Lead (Completed)   CBC with Differential/Platelet (Completed)   Comprehensive metabolic panel (Completed)   Unintentional weight loss  25LB WEIGHT LOSS since March 2021 He has tried adding more protein, but has early saitiety  Will add remeron at bedtime Obtain CT abd/pelvis, has chronic IBS issues followed by GI  But no recent imaging  CXR to be done as he is a smoker He denies any substance abuse- has been clean for 8 years  Per previous note, PSA,HIV/HEP C/TSH all normal       Relevant Orders   CBC with Differential/Platelet (Completed)   Comprehensive metabolic panel (Completed)   CT Abdomen Pelvis W Contrast    Other Visit Diagnoses    Early satiety       Relevant Orders   CT Abdomen Pelvis W Contrast   History of colitis       Relevant Orders   CT Abdomen Pelvis W Contrast      Note:  This dictation was prepared with Dragon dictation along with smaller phrase technology. Any transcriptional errors that result from this process are unintentional.

## 2020-10-31 NOTE — Patient Instructions (Addendum)
Take metoprolol take 1/2 tablet once a day  remeron at bedtime  CT scan to be done/ chest xray to be done F/U pending results

## 2020-11-01 ENCOUNTER — Encounter: Payer: Self-pay | Admitting: Family Medicine

## 2020-11-01 DIAGNOSIS — R634 Abnormal weight loss: Secondary | ICD-10-CM | POA: Insufficient documentation

## 2020-11-01 DIAGNOSIS — R000 Tachycardia, unspecified: Secondary | ICD-10-CM | POA: Insufficient documentation

## 2020-11-01 LAB — CBC WITH DIFFERENTIAL/PLATELET
Absolute Monocytes: 362 cells/uL (ref 200–950)
Basophils Absolute: 64 cells/uL (ref 0–200)
Basophils Relative: 0.9 %
Eosinophils Absolute: 50 cells/uL (ref 15–500)
Eosinophils Relative: 0.7 %
HCT: 43 % (ref 38.5–50.0)
Hemoglobin: 14.5 g/dL (ref 13.2–17.1)
Lymphs Abs: 1484 cells/uL (ref 850–3900)
MCH: 28.3 pg (ref 27.0–33.0)
MCHC: 33.7 g/dL (ref 32.0–36.0)
MCV: 84 fL (ref 80.0–100.0)
MPV: 12.1 fL (ref 7.5–12.5)
Monocytes Relative: 5.1 %
Neutro Abs: 5140 cells/uL (ref 1500–7800)
Neutrophils Relative %: 72.4 %
Platelets: 158 10*3/uL (ref 140–400)
RBC: 5.12 10*6/uL (ref 4.20–5.80)
RDW: 12.8 % (ref 11.0–15.0)
Total Lymphocyte: 20.9 %
WBC: 7.1 10*3/uL (ref 3.8–10.8)

## 2020-11-01 LAB — COMPREHENSIVE METABOLIC PANEL
AG Ratio: 1.9 (calc) (ref 1.0–2.5)
ALT: 10 U/L (ref 9–46)
AST: 14 U/L (ref 10–40)
Albumin: 4.8 g/dL (ref 3.6–5.1)
Alkaline phosphatase (APISO): 67 U/L (ref 36–130)
BUN/Creatinine Ratio: 24 (calc) — ABNORMAL HIGH (ref 6–22)
BUN: 27 mg/dL — ABNORMAL HIGH (ref 7–25)
CO2: 29 mmol/L (ref 20–32)
Calcium: 10.4 mg/dL — ABNORMAL HIGH (ref 8.6–10.3)
Chloride: 106 mmol/L (ref 98–110)
Creat: 1.13 mg/dL (ref 0.60–1.35)
Globulin: 2.5 g/dL (calc) (ref 1.9–3.7)
Glucose, Bld: 136 mg/dL — ABNORMAL HIGH (ref 65–99)
Potassium: 4.5 mmol/L (ref 3.5–5.3)
Sodium: 143 mmol/L (ref 135–146)
Total Bilirubin: 0.7 mg/dL (ref 0.2–1.2)
Total Protein: 7.3 g/dL (ref 6.1–8.1)

## 2020-11-01 NOTE — Assessment & Plan Note (Addendum)
25LB WEIGHT LOSS since March 2021 He has tried adding more protein, but has early saitiety  Will add remeron at bedtime Obtain CT abd/pelvis, has chronic IBS issues followed by GI  But no recent imaging  CXR to be done as he is a smoker He denies any substance abuse- has been clean for 8 years  Per previous note, PSA,HIV/HEP C/TSH all normal

## 2020-11-01 NOTE — Assessment & Plan Note (Signed)
Recurrent episodes, was on meds in past for this He didn't tolerate higher doses of propranolol Will try Toprol  12.5mg  as bp also elevated today  He has some underlying anxiety, worsened by the unknown reason for weight loss

## 2020-11-01 NOTE — Assessment & Plan Note (Signed)
Continue baclofen, has history of colitis as well

## 2020-11-06 ENCOUNTER — Other Ambulatory Visit: Payer: Self-pay

## 2020-11-06 DIAGNOSIS — R000 Tachycardia, unspecified: Secondary | ICD-10-CM

## 2020-11-12 ENCOUNTER — Other Ambulatory Visit: Payer: Self-pay | Admitting: Family Medicine

## 2020-11-15 ENCOUNTER — Other Ambulatory Visit: Payer: Self-pay | Admitting: Family Medicine

## 2020-11-29 ENCOUNTER — Telehealth: Payer: Self-pay | Admitting: *Deleted

## 2020-11-29 NOTE — Telephone Encounter (Signed)
Try reducing dose to 1/2 tablet at bedtime for 2 weeks If still having the fogginess, then stop the medication

## 2020-11-29 NOTE — Telephone Encounter (Signed)
Call placed to patient and patient made aware.   States that he will try 1/2 tab at this time. Reports that he is scheduled to have imaging on 11/30/2020.

## 2020-11-29 NOTE — Telephone Encounter (Signed)
Received call from patient.   Reports that he has been taking Remeron for his appetite. States that his appetite has improved, but he has been noted more irritable and has noted increased "fogginess" to his thoughts.   Please advise.

## 2020-11-30 ENCOUNTER — Ambulatory Visit (HOSPITAL_COMMUNITY)
Admission: RE | Admit: 2020-11-30 | Discharge: 2020-11-30 | Disposition: A | Discharge status: Home / Self Care | Payer: Medicare Other | Source: Ambulatory Visit | Attending: Family Medicine | Admitting: Family Medicine

## 2020-11-30 ENCOUNTER — Other Ambulatory Visit: Payer: Self-pay

## 2020-11-30 DIAGNOSIS — K529 Noninfective gastroenteritis and colitis, unspecified: Secondary | ICD-10-CM

## 2020-11-30 DIAGNOSIS — R6881 Early satiety: Secondary | ICD-10-CM | POA: Diagnosis not present

## 2020-11-30 DIAGNOSIS — R634 Abnormal weight loss: Secondary | ICD-10-CM

## 2020-11-30 DIAGNOSIS — Z8719 Personal history of other diseases of the digestive system: Secondary | ICD-10-CM | POA: Diagnosis present

## 2020-11-30 DIAGNOSIS — K3189 Other diseases of stomach and duodenum: Secondary | ICD-10-CM | POA: Diagnosis not present

## 2020-11-30 DIAGNOSIS — I878 Other specified disorders of veins: Secondary | ICD-10-CM | POA: Diagnosis not present

## 2020-11-30 DIAGNOSIS — K5730 Diverticulosis of large intestine without perforation or abscess without bleeding: Secondary | ICD-10-CM | POA: Diagnosis not present

## 2020-11-30 MED ORDER — IOHEXOL 300 MG/ML  SOLN
100.0000 mL | Freq: Once | INTRAMUSCULAR | Status: AC | PRN
  Administered 2020-11-30: 100 mL via INTRAVENOUS

## 2020-12-01 ENCOUNTER — Ambulatory Visit (HOSPITAL_COMMUNITY)
Admission: RE | Admit: 2020-12-01 | Discharge: 2020-12-01 | Disposition: A | Discharge status: Home / Self Care | Payer: Medicare Other | Source: Ambulatory Visit | Attending: Family Medicine | Admitting: Family Medicine

## 2020-12-01 DIAGNOSIS — F17200 Nicotine dependence, unspecified, uncomplicated: Secondary | ICD-10-CM | POA: Insufficient documentation

## 2020-12-01 DIAGNOSIS — R634 Abnormal weight loss: Secondary | ICD-10-CM | POA: Insufficient documentation

## 2020-12-05 ENCOUNTER — Other Ambulatory Visit: Payer: Self-pay | Admitting: *Deleted

## 2020-12-07 ENCOUNTER — Other Ambulatory Visit: Payer: Self-pay

## 2020-12-07 ENCOUNTER — Ambulatory Visit (INDEPENDENT_AMBULATORY_CARE_PROVIDER_SITE_OTHER): Payer: Medicare Other | Admitting: Gastroenterology

## 2020-12-07 ENCOUNTER — Encounter (INDEPENDENT_AMBULATORY_CARE_PROVIDER_SITE_OTHER): Payer: Self-pay

## 2020-12-07 ENCOUNTER — Encounter (INDEPENDENT_AMBULATORY_CARE_PROVIDER_SITE_OTHER): Payer: Self-pay | Admitting: Gastroenterology

## 2020-12-07 ENCOUNTER — Other Ambulatory Visit (INDEPENDENT_AMBULATORY_CARE_PROVIDER_SITE_OTHER): Payer: Self-pay

## 2020-12-07 VITALS — BP 127/81 | HR 94 | Temp 98.2°F | Ht 69.0 in | Wt 162.0 lb

## 2020-12-07 DIAGNOSIS — R634 Abnormal weight loss: Secondary | ICD-10-CM

## 2020-12-07 DIAGNOSIS — R6881 Early satiety: Secondary | ICD-10-CM | POA: Insufficient documentation

## 2020-12-07 DIAGNOSIS — K580 Irritable bowel syndrome with diarrhea: Secondary | ICD-10-CM

## 2020-12-07 NOTE — Patient Instructions (Signed)
Schedule EGD  Continue Baclofen
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
## 2020-12-11 ENCOUNTER — Other Ambulatory Visit (HOSPITAL_COMMUNITY)
Admission: RE | Admit: 2020-12-11 | Discharge: 2020-12-11 | Disposition: A | Discharge status: Home / Self Care | Payer: Medicare Other | Source: Ambulatory Visit | Attending: Gastroenterology | Admitting: Gastroenterology

## 2020-12-11 ENCOUNTER — Other Ambulatory Visit: Payer: Self-pay

## 2020-12-11 DIAGNOSIS — Z01812 Encounter for preprocedural laboratory examination: Secondary | ICD-10-CM | POA: Diagnosis not present

## 2020-12-11 DIAGNOSIS — Z20822 Contact with and (suspected) exposure to covid-19: Secondary | ICD-10-CM | POA: Insufficient documentation

## 2020-12-12 LAB — SARS CORONAVIRUS 2 (TAT 6-24 HRS): SARS Coronavirus 2: NEGATIVE

## 2020-12-13 ENCOUNTER — Encounter (HOSPITAL_COMMUNITY)
Admission: RE | Disposition: A | Discharge status: Home / Self Care | Payer: Self-pay | Source: Home / Self Care | Attending: Gastroenterology

## 2020-12-13 ENCOUNTER — Other Ambulatory Visit: Payer: Self-pay

## 2020-12-13 ENCOUNTER — Ambulatory Visit (HOSPITAL_COMMUNITY): Payer: Medicare Other | Admitting: Anesthesiology

## 2020-12-13 ENCOUNTER — Ambulatory Visit (HOSPITAL_COMMUNITY)
Admission: RE | Admit: 2020-12-13 | Discharge: 2020-12-13 | Disposition: A | Discharge status: Home / Self Care | Payer: Medicare Other | Attending: Gastroenterology | Admitting: Gastroenterology

## 2020-12-13 ENCOUNTER — Encounter (HOSPITAL_COMMUNITY): Payer: Self-pay | Admitting: Gastroenterology

## 2020-12-13 DIAGNOSIS — R634 Abnormal weight loss: Secondary | ICD-10-CM | POA: Insufficient documentation

## 2020-12-13 DIAGNOSIS — K3189 Other diseases of stomach and duodenum: Secondary | ICD-10-CM | POA: Diagnosis not present

## 2020-12-13 DIAGNOSIS — K319 Disease of stomach and duodenum, unspecified: Secondary | ICD-10-CM | POA: Insufficient documentation

## 2020-12-13 DIAGNOSIS — Z79899 Other long term (current) drug therapy: Secondary | ICD-10-CM | POA: Diagnosis not present

## 2020-12-13 DIAGNOSIS — Z6823 Body mass index (BMI) 23.0-23.9, adult: Secondary | ICD-10-CM | POA: Diagnosis not present

## 2020-12-13 DIAGNOSIS — R6881 Early satiety: Secondary | ICD-10-CM | POA: Insufficient documentation

## 2020-12-13 DIAGNOSIS — K259 Gastric ulcer, unspecified as acute or chronic, without hemorrhage or perforation: Secondary | ICD-10-CM | POA: Insufficient documentation

## 2020-12-13 DIAGNOSIS — F17210 Nicotine dependence, cigarettes, uncomplicated: Secondary | ICD-10-CM | POA: Diagnosis not present

## 2020-12-13 DIAGNOSIS — G4730 Sleep apnea, unspecified: Secondary | ICD-10-CM | POA: Diagnosis not present

## 2020-12-13 HISTORY — PX: ESOPHAGOGASTRODUODENOSCOPY (EGD) WITH PROPOFOL: SHX5813

## 2020-12-13 HISTORY — PX: BIOPSY: SHX5522

## 2020-12-13 SURGERY — ESOPHAGOGASTRODUODENOSCOPY (EGD) WITH PROPOFOL
Anesthesia: General

## 2020-12-13 MED ORDER — PROPOFOL 10 MG/ML IV BOLUS
INTRAVENOUS | Status: AC
  Filled 2020-12-13: qty 80

## 2020-12-13 MED ORDER — PROPOFOL 10 MG/ML IV BOLUS
INTRAVENOUS | Status: DC | PRN
  Administered 2020-12-13: 50 mg via INTRAVENOUS

## 2020-12-13 MED ORDER — LACTATED RINGERS IV SOLN
INTRAVENOUS | Status: DC
  Administered 2020-12-13: 1000 mL via INTRAVENOUS

## 2020-12-13 MED ORDER — MIRTAZAPINE 15 MG PO TABS: 7.5000 mg | ORAL_TABLET | Freq: Every day | ORAL | 3 refills | Status: DC

## 2020-12-13 MED ORDER — PROPOFOL 500 MG/50ML IV EMUL
INTRAVENOUS | Status: DC | PRN
  Administered 2020-12-13: 150 ug/kg/min via INTRAVENOUS

## 2020-12-13 NOTE — Anesthesia Preprocedure Evaluation (Addendum)
Anesthesia Evaluation  Patient identified by MRN, date of birth, ID band Patient awake    Reviewed: Allergy & Precautions, NPO status , Patient's Chart, lab work & pertinent test results, reviewed documented beta blocker date and time   History of Anesthesia Complications Negative for: history of anesthetic complications  Airway Mallampati: III  TM Distance: >3 FB Neck ROM: Full   Comment: Neck sx Dental  (+) Dental Advisory Given, Partial Lower   Pulmonary sleep apnea and Continuous Positive Airway Pressure Ventilation , Current SmokerPatient did not abstain from smoking.,    Pulmonary exam normal breath sounds clear to auscultation       Cardiovascular Exercise Tolerance: Good Pt. on home beta blockers Normal cardiovascular exam+ dysrhythmias (tachycardia)  Rhythm:Regular Rate:Normal     Neuro/Psych Seizures -, Well Controlled,  PSYCHIATRIC DISORDERS Anxiety Depression Bipolar Disorder  Neuromuscular disease    GI/Hepatic GERD  Medicated and Controlled,(+)     (-) substance abuse  , Hepatitis -, C  Endo/Other  negative endocrine ROS  Renal/GU negative Renal ROS     Musculoskeletal Neck sx   Abdominal   Peds  Hematology negative hematology ROS (+)   Anesthesia Other Findings Unexplained Loss of weight  Reproductive/Obstetrics negative OB ROS                            Anesthesia Physical Anesthesia Plan  ASA: III  Anesthesia Plan: General   Post-op Pain Management:    Induction: Intravenous  PONV Risk Score and Plan: TIVA  Airway Management Planned: Nasal Cannula and Natural Airway  Additional Equipment:   Intra-op Plan:   Post-operative Plan:   Informed Consent: I have reviewed the patients History and Physical, chart, labs and discussed the procedure including the risks, benefits and alternatives for the proposed anesthesia with the patient or authorized representative  who has indicated his/her understanding and acceptance.     Dental advisory given  Plan Discussed with: CRNA and Surgeon  Anesthesia Plan Comments:        Anesthesia Quick Evaluation

## 2020-12-13 NOTE — Anesthesia Postprocedure Evaluation (Signed)
Anesthesia Post Note  Patient: Francis [name]  Procedure(s) Performed: ESOPHAGOGASTRODUODENOSCOPY (EGD) WITH PROPOFOL (N/A ) BIOPSY  Patient location during evaluation: Phase II Anesthesia Type: General Level of consciousness: awake, oriented, awake and alert and patient cooperative Pain management: satisfactory to patient Vital Signs Assessment: post-procedure vital signs reviewed and stable Respiratory status: spontaneous breathing, respiratory function stable and nonlabored ventilation Cardiovascular status: stable Postop Assessment: no apparent nausea or vomiting Anesthetic complications: no   No complications documented.   Last Vitals:  Vitals:   12/13/20 0839 12/13/20 0935  BP: 130/88 101/71  Pulse: 86   Resp: 14 18  Temp: 36.8 C (!) 36.3 C  SpO2: 99% 95%    Last Pain:  Vitals:   12/13/20 0935  TempSrc: Oral  PainSc: 0-No pain                 Calysta Craigo

## 2020-12-13 NOTE — Op Note (Signed)
[facility] Patient Name: Francis [name] Procedure Date: 12/13/2020 9:02 AM MRN: [id number] Date of Birth: 10-12-1971 Attending MD: Maylon Peppers ,  CSN: [id number] Age: 50 Admit Type: Outpatient Procedure:                Upper GI endoscopy Indications:              Early satiety, Weight loss Providers:                Maylon Peppers, Gwenlyn Fudge, RN, Aram Candela Referring MD:              Medicines:                Monitored Anesthesia Care Complications:            No immediate complications. Estimated Blood Loss:     Estimated blood loss: none. Procedure:                Pre-Anesthesia Assessment:                           - Prior to the procedure, a History and Physical                            was performed, and patient medications, allergies                            and sensitivities were reviewed. The patient's                            tolerance of previous anesthesia was reviewed.                           - The risks and benefits of the procedure and the                            sedation options and risks were discussed with the                            patient. All questions were answered and informed                            consent was obtained.                           - ASA Grade Assessment: II - A patient with mild                            systemic disease.                           After obtaining informed consent, the endoscope was                            passed under direct vision. Throughout the                            procedure, the patient's blood  pressure, pulse, and                            oxygen saturations were monitored continuously. The                            [id number]-[id number] ([id number]) scope was introduced through the                            mouth, and advanced to the second part of duodenum.                            The upper GI endoscopy was accomplished without                            difficulty. The patient  tolerated the procedure                            well. Scope In: 9:21:15 AM Scope Out: 9:32:17 AM Total Procedure Duration: 0 hours 11 minutes 2 seconds  Findings:      The examined esophagus was normal.      Few non-bleeding linear and superficial gastric ulcers with a clean       ulcer base (Forrest Class III) and erosions were found in the gastric       antrum. The largest lesion was 5 mm in largest dimension. Biopsies were       taken with a cold forceps for Helicobacter pylori testing.      The examined duodenum was normal. Biopsies were taken with a cold       forceps for histology. Impression:               - Normal esophagus.                           - Non-bleeding gastric ulcers with a clean ulcer                            base (Forrest Class III). Biopsied.                           - Normal examined duodenum. Biopsied. Moderate Sedation:      Per Anesthesia Care Recommendation:           - Discharge patient to home (ambulatory).                           - Resume previous diet.                           - Await pathology results.                           - Check H. pylori serology.                           - Will consider GES if biopsy studies are negative.                           -  No ibuprofen, naproxen, or other non-steroidal                            anti-inflammatory drugs. Procedure Code(s):        --- Professional ---                           [phone], Esophagogastroduodenoscopy, flexible,                            transoral; with biopsy, single or multiple Diagnosis Code(s):        --- Professional ---                           K25.9, Gastric ulcer, unspecified as acute or                            chronic, without hemorrhage or perforation                           R68.81, Early satiety                           R63.4, Abnormal weight loss CPT copyright 2019 American Medical Association. All rights reserved. The codes documented in this report are  preliminary and upon coder review may  be revised to meet current compliance requirements. Maylon Peppers, MD Maylon Peppers,  12/13/2020 9:39:41 AM This report has been signed electronically. Number of Addenda: 0

## 2020-12-13 NOTE — Discharge Instructions (Signed)
You are being discharged to home.  Resume your previous diet.  We are waiting for your pathology results.  Do not take any ibuprofen (including Advil, Motrin or Nuprin), naproxen, or other non-steroidal anti-inflammatory drugs.     Upper Endoscopy, Adult, Care After This sheet gives you information about how to care for yourself after your procedure. Your health care provider may also give you more specific instructions. If you have problems or questions, contact your health care provider. What can I expect after the procedure? After the procedure, it is common to have:  A sore throat.  Mild stomach pain or discomfort.  Bloating.  Nausea. Follow these instructions at home:  Follow instructions from your health care provider about what to eat or drink after your procedure.  Return to your normal activities as told by your health care provider. Ask your health care provider what activities are safe for you.  Take over-the-counter and prescription medicines only as told by your health care provider.  If you were given a sedative during the procedure, it can affect you for several hours. Do not drive or operate machinery until your health care provider says that it is safe.  Keep all follow-up visits as told by your health care provider. This is important.   Contact a health care provider if you have:  A sore throat that lasts longer than one day.  Trouble swallowing. Get help right away if:  You vomit blood or your vomit looks like coffee grounds.  You have: ? A fever. ? Bloody, black, or tarry stools. ? A severe sore throat or you cannot swallow. ? Difficulty breathing. ? Severe pain in your chest or abdomen. Summary  After the procedure, it is common to have a sore throat, mild stomach discomfort, bloating, and nausea.  If you were given a sedative during the procedure, it can affect you for several hours. Do not drive or operate machinery until your health care  provider says that it is safe.  Follow instructions from your health care provider about what to eat or drink after your procedure.  Return to your normal activities as told by your health care provider. This information is not intended to replace advice given to you by your health care provider. Make sure you discuss any questions you have with your health care provider. Document Revised: 10/12/2019 Document Reviewed: 03/16/2018 Elsevier Patient Education  2021 [employer].

## 2020-12-13 NOTE — Transfer of Care (Signed)
Immediate Anesthesia Transfer of Care Note  Patient: Kayd Launer  Procedure(s) Performed: ESOPHAGOGASTRODUODENOSCOPY (EGD) WITH PROPOFOL (N/A ) BIOPSY  Patient Location: PACU  Anesthesia Type:General  Level of Consciousness: awake, alert , oriented and patient cooperative  Airway & Oxygen Therapy: Patient Spontanous Breathing  Post-op Assessment: Report given to RN, Post -op Vital signs reviewed and stable and Patient moving all extremities X 4  Post vital signs: Reviewed and stable  Last Vitals:  Vitals Value Taken Time  BP 101/71 12/13/20 0935  Temp 36.3 C 12/13/20 0935  Pulse    Resp 18 12/13/20 0935  SpO2 95 % 12/13/20 0935    Last Pain:  Vitals:   12/13/20 0935  TempSrc: Oral  PainSc: 0-No pain      Patients Stated Pain Goal: 9 (48/47/20 7218)  Complications: No complications documented.

## 2020-12-13 NOTE — Interval H&P Note (Signed)
History and Physical Interval Note:  12/13/2020 9:11 AM.  Francis [name] is a 50 y.o. male with PMH C. Diff colitis, hepatitis C s/p Epclusa with SVR, OSA, seizures, IBS-D, bipolar disorder, who presents to the hospital for evaluation of weight loss and early satiety.  The patient states that he still having early satiety and is only eating 1 meal a day.  Denies having any nausea, vomiting, fever, chills, abdominal pain or distention.  Has felt well otherwise.  His last EGD was performed in 2017 but no official report is available for [place] to review although he reports that he had a dilation performed at that time.  BP 130/88   Pulse 86   Temp 98.2 F (36.8 C) (Oral)   Resp 14   Ht 5\' 9"  (1.753 m)   Wt 70.8 kg   SpO2 99%   BMI 23.04 kg/m  GENERAL: The patient is AO x3, in no acute distress. HEENT: Head is normocephalic and atraumatic. EOMI are intact. Mouth is well hydrated and without lesions. NECK: Supple. No masses LUNGS: Clear to auscultation. No presence of rhonchi/wheezing/rales. Adequate chest expansion HEART: RRR, normal s1 and s2. ABDOMEN: Soft, nontender, no guarding, no peritoneal signs, and nondistended. BS +. No masses. EXTREMITIES: Without any cyanosis, clubbing, rash, lesions or edema. NEUROLOGIC: AOx3, no focal motor deficit. SKIN: no jaundice, no rashes   [name]  has presented today for surgery, with the diagnosis of Early Satiety Weight Loss.  The various methods of treatment have been discussed with the patient and family. After consideration of risks, benefits and other options for treatment, the patient has consented to  Procedure(s) with comments: ESOPHAGOGASTRODUODENOSCOPY (EGD) WITH PROPOFOL (N/A) - 10:00 as a surgical intervention.  The patient's history has been reviewed, patient examined, no change in status, stable for surgery.  I have reviewed the patient's chart and labs.  Questions were answered to the patient's satisfaction.     [name]

## 2020-12-14 LAB — SURGICAL PATHOLOGY

## 2020-12-18 ENCOUNTER — Encounter (HOSPITAL_COMMUNITY): Payer: Self-pay | Admitting: Gastroenterology

## 2020-12-18 ENCOUNTER — Other Ambulatory Visit: Payer: Self-pay | Admitting: Family Medicine

## 2020-12-18 NOTE — Telephone Encounter (Signed)
Ok to refill 

## 2020-12-28 ENCOUNTER — Ambulatory Visit: Payer: Medicare Other | Admitting: Internal Medicine

## 2020-12-28 ENCOUNTER — Encounter: Payer: Self-pay | Admitting: Nurse Practitioner

## 2020-12-28 ENCOUNTER — Ambulatory Visit (INDEPENDENT_AMBULATORY_CARE_PROVIDER_SITE_OTHER): Payer: Medicare Other | Admitting: Nurse Practitioner

## 2020-12-28 ENCOUNTER — Other Ambulatory Visit: Payer: Self-pay

## 2020-12-28 DIAGNOSIS — K580 Irritable bowel syndrome with diarrhea: Secondary | ICD-10-CM | POA: Diagnosis not present

## 2020-12-28 DIAGNOSIS — R634 Abnormal weight loss: Secondary | ICD-10-CM | POA: Diagnosis not present

## 2020-12-28 DIAGNOSIS — G4733 Obstructive sleep apnea (adult) (pediatric): Secondary | ICD-10-CM | POA: Diagnosis not present

## 2020-12-28 DIAGNOSIS — R000 Tachycardia, unspecified: Secondary | ICD-10-CM | POA: Diagnosis not present

## 2020-12-28 DIAGNOSIS — R6881 Early satiety: Secondary | ICD-10-CM

## 2020-12-28 DIAGNOSIS — Z7689 Persons encountering health services in other specified circumstances: Secondary | ICD-10-CM

## 2020-12-28 DIAGNOSIS — Z720 Tobacco use: Secondary | ICD-10-CM | POA: Diagnosis not present
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
## 2020-12-28 NOTE — Assessment & Plan Note (Signed)
-  he states this resolved as he has lost weight unintentionally

## 2020-12-28 NOTE — Assessment & Plan Note (Signed)
-  had Feb 2022 EGD with Dr. Olevia Perches office [facility] Readings from Last 3 Encounters:  12/28/20 161 lb (73 kg)  12/13/20 156 lb (70.8 kg)  12/07/20 162 lb (73.5 kg)

## 2020-12-28 NOTE — Assessment & Plan Note (Signed)
-  may needs CXR in the future for weight loss to screen for  Lung etiology -been a smoker for > 30 years

## 2020-12-28 NOTE — Assessment & Plan Note (Signed)
-  IBS-D; doing well with baclofen -followed by Dr. Jenetta Downer

## 2020-12-28 NOTE — Assessment & Plan Note (Signed)
-  he states he has had unintentional weight loss for 2-3 years (since 2019) -he has pending cardiac and GI work-up -TSH was WNL with last check, but may consider T3 and T4 check with next set of labs

## 2020-12-28 NOTE — Assessment & Plan Note (Signed)
-  taking metoprolol and has no tachycardia today -has upcoming appt with Dr. Domenic Polite

## 2020-12-28 NOTE — Assessment & Plan Note (Signed)
-  briefly reviewed records in [place] from Dr. Buelah Manis

## 2020-12-29 ENCOUNTER — Encounter: Payer: Self-pay | Admitting: Cardiology
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
## 2021-01-01 ENCOUNTER — Ambulatory Visit (INDEPENDENT_AMBULATORY_CARE_PROVIDER_SITE_OTHER): Payer: Medicare Other

## 2021-01-01 ENCOUNTER — Encounter: Payer: Self-pay | Admitting: Cardiology

## 2021-01-01 ENCOUNTER — Other Ambulatory Visit: Payer: Self-pay | Admitting: Cardiology

## 2021-01-01 ENCOUNTER — Other Ambulatory Visit: Payer: Self-pay

## 2021-01-01 ENCOUNTER — Ambulatory Visit: Payer: Medicare Other | Admitting: Cardiology

## 2021-01-01 VITALS — BP 132/78 | HR 88 | Ht 69.0 in | Wt 162.5 lb

## 2021-01-01 DIAGNOSIS — R0602 Shortness of breath: Secondary | ICD-10-CM | POA: Diagnosis not present

## 2021-01-01 DIAGNOSIS — R000 Tachycardia, unspecified: Secondary | ICD-10-CM

## 2021-01-01 DIAGNOSIS — R002 Palpitations: Secondary | ICD-10-CM

## 2021-01-01 DIAGNOSIS — I479 Paroxysmal tachycardia, unspecified: Secondary | ICD-10-CM

## 2021-01-01 NOTE — Patient Instructions (Addendum)
Medication Instructions:  Your physician recommends that you continue on your current medications as directed. Please refer to the Current Medication list given to you today.  *If you need a refill on your cardiac medications before your next appointment, please call your pharmacy*   Lab Work: none If you have labs (blood work) drawn today and your tests are completely normal, you will receive your results only by: Marland Kitchen MyChart Message (if you have MyChart) OR . A paper copy in the mail If you have any lab test that is abnormal or we need to change your treatment, we will call you to review the results.   Testing/Procedures: Your physician has requested that you have an echocardiogram. Echocardiography is a painless test that uses sound waves to create images of your heart. It provides your doctor with information about the size and shape of your heart and how well your heart's chambers and valves are working. This procedure takes approximately one hour. There are no restrictions for this procedure.     Follow-Up: At [facility], you and your health needs are our priority.  As part of our continuing mission to provide you with exceptional heart care, we have created designated Provider Care Teams.  These Care Teams include your primary Cardiologist (physician) and Advanced Practice Providers (APPs -  Physician Assistants and Nurse Practitioners) who all work together to provide you with the care you need, when you need it.  We recommend signing up for the patient portal called "MyChart".  Sign up information is provided on this After Visit Summary.  MyChart is used to connect with patients for Virtual Visits (Telemedicine).  Patients are able to view lab/test results, encounter notes, upcoming appointments, etc.  Non-urgent messages can be sent to your provider as well.   To learn more about what you can do with MyChart, go to [email or web address].    Your next appointment:   We will  call with follow up instructions   Other Instructions  Echocardiogram An echocardiogram is a test that uses sound waves (ultrasound) to produce images of the heart. Images from an echocardiogram can provide important information about:  Heart size and shape.  The size and thickness and movement of your heart's walls.  Heart muscle function and strength.  Heart valve function or if you have stenosis. Stenosis is when the heart valves are too narrow.  If blood is flowing backward through the heart valves (regurgitation).  A tumor or infectious growth around the heart valves.  Areas of heart muscle that are not working well because of poor blood flow or injury from a heart attack.  Aneurysm detection. An aneurysm is a weak or damaged part of an artery wall. The wall bulges out from the normal force of blood pumping through the body. Tell a health care provider about:  Any allergies you have.  All medicines you are taking, including vitamins, herbs, eye drops, creams, and over-the-counter medicines.  Any blood disorders you have.  Any surgeries you have had.  Any medical conditions you have.  Whether you are pregnant or may be pregnant. What are the risks? Generally, this is a safe test. However, problems may occur, including an allergic reaction to dye (contrast) that may be used during the test. What happens before the test? No specific preparation is needed. You may eat and drink normally. What happens during the test?  You will take off your clothes from the waist up and put on a hospital gown.  Electrodes or  electrocardiogram (ECG)patches may be placed on your chest. The electrodes or patches are then connected to a device that monitors your heart rate and rhythm.  You will lie down on a table for an ultrasound exam. A gel will be applied to your chest to help sound waves pass through your skin.  A handheld device, called a transducer, will be pressed against your  chest and moved over your heart. The transducer produces sound waves that travel to your heart and bounce back (or "echo" back) to the transducer. These sound waves will be captured in real-time and changed into images of your heart that can be viewed on a video monitor. The images will be recorded on a computer and reviewed by your health care provider.  You may be asked to change positions or hold your breath for a short time. This makes it easier to get different views or better views of your heart.  In some cases, you may receive contrast through an IV in one of your veins. This can improve the quality of the pictures from your heart. The procedure may vary among health care providers and hospitals.   What can I expect after the test? You may return to your normal, everyday life, including diet, activities, and medicines, unless your health care provider tells you not to do that. Follow these instructions at home:  It is up to you to get the results of your test. Ask your health care provider, or the department that is doing the test, when your results will be ready.  Keep all follow-up visits. This is important. Summary  An echocardiogram is a test that uses sound waves (ultrasound) to produce images of the heart.  Images from an echocardiogram can provide important information about the size and shape of your heart, heart muscle function, heart valve function, and other possible heart problems.  You do not need to do anything to prepare before this test. You may eat and drink normally.  After the echocardiogram is completed, you may return to your normal, everyday life, unless your health care provider tells you not to do that. This information is not intended to replace advice given to you by your health care provider. Make sure you discuss any questions you have with your health care provider. Document Revised: 06/06/2020 Document Reviewed: 06/06/2020 Elsevier Patient Education  2021  [employer].

## 2021-01-10 DIAGNOSIS — R002 Palpitations: Secondary | ICD-10-CM | POA: Diagnosis not present

## 2021-01-15 ENCOUNTER — Telehealth (INDEPENDENT_AMBULATORY_CARE_PROVIDER_SITE_OTHER): Payer: Self-pay | Admitting: Gastroenterology

## 2021-01-15 ENCOUNTER — Other Ambulatory Visit (INDEPENDENT_AMBULATORY_CARE_PROVIDER_SITE_OTHER): Payer: Self-pay | Admitting: Gastroenterology

## 2021-01-15 DIAGNOSIS — R630 Anorexia: Secondary | ICD-10-CM

## 2021-01-15 MED ORDER — MEGESTROL ACETATE 625 MG/5ML PO SUSP
625.0000 mg | Freq: Every day | ORAL | 0 refills | Status: DC
Start: 2021-01-15 — End: 2021-02-21

## 2021-01-15 NOTE — Telephone Encounter (Signed)
Patient left voice mail stating he wants to stop taking Remeron - states he doesn't like the side effects and the way it makes him feel - would like to be put on something else for his appetite - please advise - ph# [phone]

## 2021-01-15 NOTE — Telephone Encounter (Signed)
Last seen 12/07/2020 by Dr. Jenetta Downer for IBS-D. Please advise.

## 2021-01-15 NOTE — Telephone Encounter (Signed)
Patient aware of all.

## 2021-01-15 NOTE — Telephone Encounter (Signed)
Will send Megace for only one month to help him regain some weight but will not prescribe this for long term. Prescription sent to pharmacy

## 2021-01-22 ENCOUNTER — Other Ambulatory Visit (HOSPITAL_COMMUNITY): Payer: Medicare Other

## 2021-01-31 ENCOUNTER — Other Ambulatory Visit: Payer: Self-pay

## 2021-01-31 ENCOUNTER — Ambulatory Visit (HOSPITAL_COMMUNITY)
Admission: RE | Admit: 2021-01-31 | Discharge: 2021-01-31 | Disposition: A | Discharge status: Home / Self Care | Payer: Medicare Other | Source: Ambulatory Visit | Attending: Cardiology | Admitting: Cardiology

## 2021-01-31 DIAGNOSIS — R0602 Shortness of breath: Secondary | ICD-10-CM | POA: Diagnosis not present

## 2021-01-31 LAB — ECHOCARDIOGRAM COMPLETE
AR max vel: 1.83 cm2
AV Area VTI: 1.87 cm2
AV Area mean vel: 1.71 cm2
AV Mean grad: 4.8 mmHg
AV Peak grad: 9 mmHg
Ao pk vel: 1.5 m/s
Area-P 1/2: 3.42 cm2
S' Lateral: 3 cm

## 2021-01-31 NOTE — Progress Notes (Signed)
*  PRELIMINARY RESULTS* Echocardiogram 2D Echocardiogram has been performed.  Francis [name] 01/31/2021, 12:40 PM

## 2021-02-01 ENCOUNTER — Other Ambulatory Visit: Payer: Self-pay

## 2021-02-01 ENCOUNTER — Ambulatory Visit (INDEPENDENT_AMBULATORY_CARE_PROVIDER_SITE_OTHER): Payer: Medicare Other | Admitting: Nurse Practitioner

## 2021-02-01 ENCOUNTER — Encounter: Payer: Self-pay | Admitting: Nurse Practitioner

## 2021-02-01 VITALS — BP 119/82 | HR 88 | Temp 98.8°F | Resp 20 | Ht 69.0 in | Wt 164.0 lb

## 2021-02-01 DIAGNOSIS — R634 Abnormal weight loss: Secondary | ICD-10-CM

## 2021-02-01 DIAGNOSIS — R000 Tachycardia, unspecified: Secondary | ICD-10-CM | POA: Diagnosis not present

## 2021-02-01 DIAGNOSIS — R6881 Early satiety: Secondary | ICD-10-CM | POA: Diagnosis not present

## 2021-02-01 DIAGNOSIS — Z7689 Persons encountering health services in other specified circumstances: Secondary | ICD-10-CM

## 2021-02-01 NOTE — Assessment & Plan Note (Signed)
-  saw cardiology; results reviewed -no reason for further testing after heart monitor and echo
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
## 2021-02-01 NOTE — Assessment & Plan Note (Addendum)
-  followed by Dr. Laural Golden -weight stable -taking megace now; GI stopped remeron

## 2021-02-01 NOTE — Assessment & Plan Note (Addendum)
Lab Results  Component Value Date   TSH 0.89 08/30/2020  -will check TSH today Wt Readings from Last 3 Encounters:  02/01/21 164 lb (74.4 kg)  01/01/21 162 lb 8 oz (73.7 kg)  12/28/20 161 lb (73 kg)  -weight looks stable over the last month -he states that feeling of early satiety may be in his head, but not interested in psych consult at this time

## 2021-02-02 LAB — CBC WITH DIFFERENTIAL/PLATELET
Basophils Absolute: 0.1 10*3/uL (ref 0.0–0.2)
Basos: 1 %
EOS (ABSOLUTE): 0 10*3/uL (ref 0.0–0.4)
Eos: 0 %
Hematocrit: 41 % (ref 37.5–51.0)
Hemoglobin: 13.6 g/dL (ref 13.0–17.7)
Immature Grans (Abs): 0.1 10*3/uL (ref 0.0–0.1)
Immature Granulocytes: 1 %
Lymphocytes Absolute: 2.2 10*3/uL (ref 0.7–3.1)
Lymphs: 30 %
MCH: 27.9 pg (ref 26.6–33.0)
MCHC: 33.2 g/dL (ref 31.5–35.7)
MCV: 84 fL (ref 79–97)
Monocytes Absolute: 0.5 10*3/uL (ref 0.1–0.9)
Monocytes: 7 %
Neutrophils Absolute: 4.5 10*3/uL (ref 1.4–7.0)
Neutrophils: 61 %
Platelets: 223 10*3/uL (ref 150–450)
RBC: 4.87 x10E6/uL (ref 4.14–5.80)
RDW: 13.1 % (ref 11.6–15.4)
WBC: 7.4 10*3/uL (ref 3.4–10.8)

## 2021-02-02 LAB — CMP14+EGFR
ALT: 10 IU/L (ref 0–44)
AST: 17 IU/L (ref 0–40)
Albumin/Globulin Ratio: 2.5 — ABNORMAL HIGH (ref 1.2–2.2)
Albumin: 5 g/dL (ref 4.0–5.0)
Alkaline Phosphatase: 60 IU/L (ref 44–121)
BUN/Creatinine Ratio: 25 — ABNORMAL HIGH (ref 9–20)
BUN: 28 mg/dL — ABNORMAL HIGH (ref 6–24)
Bilirubin Total: 0.8 mg/dL (ref 0.0–1.2)
CO2: 18 mmol/L — ABNORMAL LOW (ref 20–29)
Calcium: 9.5 mg/dL (ref 8.7–10.2)
Chloride: 102 mmol/L (ref 96–106)
Creatinine, Ser: 1.14 mg/dL (ref 0.76–1.27)
Globulin, Total: 2 g/dL (ref 1.5–4.5)
Glucose: 102 mg/dL — ABNORMAL HIGH (ref 65–99)
Potassium: 4.4 mmol/L (ref 3.5–5.2)
Sodium: 139 mmol/L (ref 134–144)
Total Protein: 7 g/dL (ref 6.0–8.5)
eGFR: 78 mL/min/{1.73_m2} (ref >59)

## 2021-02-02 LAB — LIPID PANEL WITH LDL/HDL RATIO
Cholesterol, Total: 166 mg/dL (ref 100–199)
HDL: 47 mg/dL (ref >39)
LDL Chol Calc (NIH): 107 mg/dL — ABNORMAL HIGH (ref 0–99)
LDL/HDL Ratio: 2.3 ratio (ref 0.0–3.6)
Triglycerides: 60 mg/dL (ref 0–149)
VLDL Cholesterol Cal: 12 mg/dL (ref 5–40)

## 2021-02-02 LAB — TSH: TSH: 1.28 u[IU]/mL (ref 0.450–4.500)

## 2021-02-02 NOTE — Progress Notes (Signed)
Labs are good. Thyroid function is WNL.

## 2021-02-20 ENCOUNTER — Ambulatory Visit (INDEPENDENT_AMBULATORY_CARE_PROVIDER_SITE_OTHER): Payer: Medicare Other | Admitting: Internal Medicine

## 2021-02-21 ENCOUNTER — Ambulatory Visit (INDEPENDENT_AMBULATORY_CARE_PROVIDER_SITE_OTHER): Payer: Medicare Other | Admitting: Gastroenterology

## 2021-02-21 ENCOUNTER — Encounter (INDEPENDENT_AMBULATORY_CARE_PROVIDER_SITE_OTHER): Payer: Self-pay | Admitting: Gastroenterology

## 2021-02-21 ENCOUNTER — Other Ambulatory Visit: Payer: Self-pay

## 2021-02-21 VITALS — BP 121/78 | HR 80 | Temp 98.9°F | Ht 69.0 in | Wt 162.0 lb

## 2021-02-21 DIAGNOSIS — R634 Abnormal weight loss: Secondary | ICD-10-CM

## 2021-02-21 DIAGNOSIS — K580 Irritable bowel syndrome with diarrhea: Secondary | ICD-10-CM

## 2021-02-21 MED ORDER — MEGESTROL ACETATE 20 MG PO TABS: 20.0000 mg | ORAL_TABLET | ORAL | 3 refills | Status: DC

## 2021-02-21 NOTE — Patient Instructions (Signed)
Continue Megace every other day If not having a BM in 2 days, start Miralax daily
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
## 2021-04-24 ENCOUNTER — Other Ambulatory Visit: Payer: Self-pay

## 2021-04-24 ENCOUNTER — Encounter: Payer: Self-pay | Admitting: Nurse Practitioner

## 2021-04-24 ENCOUNTER — Telehealth (INDEPENDENT_AMBULATORY_CARE_PROVIDER_SITE_OTHER): Payer: Medicare Other | Admitting: Nurse Practitioner

## 2021-04-24 DIAGNOSIS — U071 COVID-19: Secondary | ICD-10-CM

## 2021-04-24 MED ORDER — NIRMATRELVIR/RITONAVIR (PAXLOVID)TABLET: 3.0000 | ORAL_TABLET | Freq: Two times a day (BID) | ORAL | 0 refills | Status: AC

## 2021-04-24 MED ORDER — NOREL AD 4-10-325 MG PO TABS: 1.0000 | ORAL_TABLET | ORAL | 1 refills | Status: DC | PRN

## 2021-04-24 NOTE — Assessment & Plan Note (Signed)
-  Rx. paxlovid and norel -call if symptoms get worse or last over a week
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
## 2021-04-25 ENCOUNTER — Other Ambulatory Visit: Payer: Self-pay | Admitting: *Deleted

## 2021-04-25 ENCOUNTER — Telehealth: Payer: Medicare Other | Admitting: Nurse Practitioner

## 2021-04-25 ENCOUNTER — Other Ambulatory Visit: Payer: Self-pay | Admitting: Nurse Practitioner

## 2021-04-25 ENCOUNTER — Telehealth: Payer: Self-pay

## 2021-04-25 DIAGNOSIS — G4700 Insomnia, unspecified: Secondary | ICD-10-CM | POA: Insufficient documentation

## 2021-04-25 DIAGNOSIS — G4701 Insomnia due to medical condition: Secondary | ICD-10-CM

## 2021-04-25 MED ORDER — TRAZODONE HCL 50 MG PO TABS: 25.0000 mg | ORAL_TABLET | Freq: Every evening | ORAL | 3 refills | Status: DC | PRN

## 2021-04-25 MED ORDER — BACLOFEN 20 MG PO TABS: ORAL_TABLET | ORAL | 1 refills | Status: DC

## 2021-04-25 NOTE — Telephone Encounter (Signed)
Yeah. He can take both.

## 2021-04-25 NOTE — Telephone Encounter (Signed)
Baclofen sent for pt

## 2021-04-25 NOTE — Telephone Encounter (Signed)
Pt is calling states he has not slept in 48 hrs, not sure if this is covid related or something else, needs to talk to a nurse--just tested pos for covid

## 2021-04-25 NOTE — Telephone Encounter (Signed)
I sent in some trazodone. That should help him get to sleep. If the restless legs continue to be an issue after he gets some sleep, we should set up a visit.

## 2021-04-25 NOTE — Telephone Encounter (Signed)
Pt is not sleeping he has not slept in 48 hours and has epilepsy he is not sure if this is coming from covid he also has restless legs really bad and not sure what he needs to do about this please advise

## 2021-04-25 NOTE — Assessment & Plan Note (Signed)
-  unable to sleep x 48 hours; is COVID positive -Rx. trazodone

## 2021-04-25 NOTE — Telephone Encounter (Signed)
Pt notified trazodone sent to pharmacy with verbal understanding would like his baclofen refilled also. No this is a muscle relaxer so is this ok to fill being you started him on trazodone?

## 2021-05-21 ENCOUNTER — Other Ambulatory Visit: Payer: Self-pay

## 2021-05-21 MED ORDER — METOPROLOL SUCCINATE ER 25 MG PO TB24: ORAL_TABLET | ORAL | 3 refills | Status: DC

## 2021-06-02 ENCOUNTER — Other Ambulatory Visit: Payer: Self-pay | Admitting: Family Medicine

## 2021-06-13 ENCOUNTER — Encounter: Payer: Self-pay | Admitting: Nurse Practitioner

## 2021-06-13 ENCOUNTER — Other Ambulatory Visit: Payer: Self-pay

## 2021-06-13 ENCOUNTER — Ambulatory Visit (INDEPENDENT_AMBULATORY_CARE_PROVIDER_SITE_OTHER): Payer: Medicare Other | Admitting: Nurse Practitioner

## 2021-06-13 VITALS — BP 143/85 | HR 120 | Ht 69.0 in | Wt 158.0 lb

## 2021-06-13 DIAGNOSIS — R413 Other amnesia: Secondary | ICD-10-CM | POA: Diagnosis not present

## 2021-06-13 DIAGNOSIS — M5416 Radiculopathy, lumbar region: Secondary | ICD-10-CM | POA: Insufficient documentation

## 2021-06-13 DIAGNOSIS — M5431 Sciatica, right side: Secondary | ICD-10-CM

## 2021-06-13 DIAGNOSIS — S39012A Strain of muscle, fascia and tendon of lower back, initial encounter: Secondary | ICD-10-CM | POA: Insufficient documentation

## 2021-06-13 MED ORDER — PREDNISONE 20 MG PO TABS: 40.0000 mg | ORAL_TABLET | Freq: Every day | ORAL | 0 refills | Status: DC

## 2021-06-13 MED ORDER — IBUPROFEN 600 MG PO TABS
600.0000 mg | ORAL_TABLET | Freq: Three times a day (TID) | ORAL | 0 refills | Status: DC | PRN
Start: 2021-06-13 — End: 2021-08-02

## 2021-06-13 NOTE — Assessment & Plan Note (Signed)
-  Rx. Prednisone and ibuprofen -he has baclofen at home -if no improvement in 1 week, will consider ortho referral
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
## 2021-06-13 NOTE — Assessment & Plan Note (Addendum)
MMSE today 27/30 -he is requesting neurology referral; will honor that request -he is having trouble recalling faces and is telling the same stories over and over again

## 2021-06-20 ENCOUNTER — Telehealth: Payer: Self-pay

## 2021-06-20 ENCOUNTER — Other Ambulatory Visit: Payer: Self-pay

## 2021-06-20 MED ORDER — BACLOFEN 20 MG PO TABS: ORAL_TABLET | ORAL | 1 refills | Status: DC

## 2021-06-20 NOTE — Telephone Encounter (Signed)
Rx sent 

## 2021-06-20 NOTE — Telephone Encounter (Signed)
Patient called need med refill  baclofen (LIORESAL) 20 MG tablet [id number]   [facility]

## 2021-07-11 ENCOUNTER — Ambulatory Visit: Payer: Medicare Other | Admitting: Urology

## 2021-07-24 ENCOUNTER — Ambulatory Visit: Payer: Medicare Other | Admitting: Psychiatry

## 2021-08-02 ENCOUNTER — Ambulatory Visit (INDEPENDENT_AMBULATORY_CARE_PROVIDER_SITE_OTHER): Payer: Medicare Other | Admitting: Nurse Practitioner

## 2021-08-02 ENCOUNTER — Other Ambulatory Visit: Payer: Self-pay

## 2021-08-02 ENCOUNTER — Encounter: Payer: Self-pay | Admitting: Nurse Practitioner

## 2021-08-02 VITALS — BP 121/76 | HR 98 | Temp 98.5°F | Ht 69.0 in | Wt 162.0 lb

## 2021-08-02 DIAGNOSIS — Z7252 High risk homosexual behavior: Secondary | ICD-10-CM

## 2021-08-02 DIAGNOSIS — R7301 Impaired fasting glucose: Secondary | ICD-10-CM | POA: Diagnosis not present

## 2021-08-02 DIAGNOSIS — E7800 Pure hypercholesterolemia, unspecified: Secondary | ICD-10-CM | POA: Diagnosis not present

## 2021-08-02 DIAGNOSIS — Z7251 High risk heterosexual behavior: Secondary | ICD-10-CM | POA: Insufficient documentation

## 2021-08-02 MED ORDER — DESCOVY 200-25 MG PO TABS
1.0000 | ORAL_TABLET | Freq: Every day | ORAL | 11 refills | Status: DC
Start: 2021-08-02 — End: 2021-10-23

## 2021-08-02 NOTE — Patient Instructions (Addendum)
   Product Labeling Descovy (FTC/TAF) is indicated in at-risk adults and adolescents weighing ?35 kg for PrEP to reduce the risk of HIV-1 infection from sexual acquisition, excluding individuals at risk from receptive vaginal sex. Individuals must have a negative HIV-1 test immediately prior to initiating FTC/TAF for HIV-1 PrEP.1 Limitations of Use: The indication does not include the use of FTC/TAF in individuals at risk of HIV-1 from receptive vaginal sex because effectiveness in this population has not been evaluated.1 Use FTC/TAF for HIV-1 PrEP to reduce the risk of HIV-1 infection as part of a comprehensive prevention strategy, including adherence to daily administration and safer sex practices, including condoms, to reduce the risk of STIs.1 The time from initiation of FTC/TAF for HIV-1 PrEP to maximal protection against HIV-1 infection is unknown.1  EC90 was reached within 1 to 2 hours after a single dose of FTC/TAF

## 2021-08-02 NOTE — Assessment & Plan Note (Signed)
-  check for HIV with this set of labs -he will need q3 month HIV testing -Rx. Descovy
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
## 2021-08-03 DIAGNOSIS — E7800 Pure hypercholesterolemia, unspecified: Secondary | ICD-10-CM | POA: Diagnosis not present

## 2021-08-03 DIAGNOSIS — R7301 Impaired fasting glucose: Secondary | ICD-10-CM | POA: Diagnosis not present

## 2021-08-04 LAB — CMP14+EGFR
ALT: 12 IU/L (ref 0–44)
AST: 14 IU/L (ref 0–40)
Albumin/Globulin Ratio: 2.1 (ref 1.2–2.2)
Albumin: 4.6 g/dL (ref 4.0–5.0)
Alkaline Phosphatase: 75 IU/L (ref 44–121)
BUN/Creatinine Ratio: 21 — ABNORMAL HIGH (ref 9–20)
BUN: 23 mg/dL (ref 6–24)
Bilirubin Total: 0.4 mg/dL (ref 0.0–1.2)
CO2: 24 mmol/L (ref 20–29)
Calcium: 9.7 mg/dL (ref 8.7–10.2)
Chloride: 105 mmol/L (ref 96–106)
Creatinine, Ser: 1.12 mg/dL (ref 0.76–1.27)
Globulin, Total: 2.2 g/dL (ref 1.5–4.5)
Glucose: 100 mg/dL — ABNORMAL HIGH (ref 70–99)
Potassium: 4.7 mmol/L (ref 3.5–5.2)
Sodium: 143 mmol/L (ref 134–144)
Total Protein: 6.8 g/dL (ref 6.0–8.5)
eGFR: 80 mL/min/{1.73_m2} (ref >59)

## 2021-08-04 LAB — CBC WITH DIFFERENTIAL/PLATELET
Basophils Absolute: 0 10*3/uL (ref 0.0–0.2)
Basos: 1 %
EOS (ABSOLUTE): 0 10*3/uL (ref 0.0–0.4)
Eos: 1 %
Hematocrit: 40.3 % (ref 37.5–51.0)
Hemoglobin: 13.3 g/dL (ref 13.0–17.7)
Immature Grans (Abs): 0 10*3/uL (ref 0.0–0.1)
Immature Granulocytes: 0 %
Lymphocytes Absolute: 1.2 10*3/uL (ref 0.7–3.1)
Lymphs: 32 %
MCH: 27.5 pg (ref 26.6–33.0)
MCHC: 33 g/dL (ref 31.5–35.7)
MCV: 83 fL (ref 79–97)
Monocytes Absolute: 0.3 10*3/uL (ref 0.1–0.9)
Monocytes: 9 %
Neutrophils Absolute: 2.1 10*3/uL (ref 1.4–7.0)
Neutrophils: 57 %
Platelets: 154 10*3/uL (ref 150–450)
RBC: 4.83 x10E6/uL (ref 4.14–5.80)
RDW: 13 % (ref 11.6–15.4)
WBC: 3.7 10*3/uL (ref 3.4–10.8)

## 2021-08-04 LAB — HEMOGLOBIN A1C
Est. average glucose Bld gHb Est-mCnc: 100 mg/dL
Hgb A1c MFr Bld: 5.1 % (ref 4.8–5.6)

## 2021-08-04 LAB — LIPID PANEL WITH LDL/HDL RATIO
Cholesterol, Total: 182 mg/dL (ref 100–199)
HDL: 47 mg/dL (ref >39)
LDL Chol Calc (NIH): 126 mg/dL — ABNORMAL HIGH (ref 0–99)
LDL/HDL Ratio: 2.7 ratio (ref 0.0–3.6)
Triglycerides: 47 mg/dL (ref 0–149)
VLDL Cholesterol Cal: 9 mg/dL (ref 5–40)

## 2021-08-04 LAB — HIV ANTIBODY (ROUTINE TESTING W REFLEX): HIV Screen 4th Generation wRfx: NONREACTIVE

## 2021-08-06 ENCOUNTER — Other Ambulatory Visit: Payer: Self-pay

## 2021-08-06 ENCOUNTER — Ambulatory Visit: Payer: Medicare Other | Admitting: Urology

## 2021-08-06 ENCOUNTER — Telehealth: Payer: Self-pay

## 2021-08-06 MED ORDER — BACLOFEN 20 MG PO TABS: ORAL_TABLET | ORAL | 1 refills | Status: DC

## 2021-08-06 NOTE — Telephone Encounter (Signed)
Patient called asking does Francis [name] have  any programs or grant that will help patient pay for this medicine? Descovy, can't afford this medicine too expensive. $335.00 out of pocket after his insurance.  Patient call back # [phone]

## 2021-08-06 NOTE — Telephone Encounter (Signed)
https://www.[email or web address]/copay-[email or web address]-card  Have him go to this website, and he can activate a savings card that should dramatically reduce the price of the medication.

## 2021-08-06 NOTE — Progress Notes (Signed)
Cholesterol is slightly elevated, so cut back on fried/fatty foods. HIV was negative, so he is good to start descovy.

## 2021-08-09 ENCOUNTER — Ambulatory Visit: Payer: Medicare Other | Admitting: Psychiatry

## 2021-08-20 ENCOUNTER — Ambulatory Visit: Payer: Medicare Other | Admitting: Psychiatry

## 2021-08-28 ENCOUNTER — Other Ambulatory Visit: Payer: Self-pay | Admitting: *Deleted

## 2021-08-28 DIAGNOSIS — G4701 Insomnia due to medical condition: Secondary | ICD-10-CM

## 2021-08-28 MED ORDER — TRAZODONE HCL 50 MG PO TABS: 25.0000 mg | ORAL_TABLET | Freq: Every evening | ORAL | 3 refills | Status: DC | PRN

## 2021-09-03 ENCOUNTER — Telehealth: Payer: Self-pay | Admitting: Nurse Practitioner

## 2021-09-03 ENCOUNTER — Telehealth: Payer: Self-pay

## 2021-09-03 NOTE — Telephone Encounter (Signed)
Pt states that he was possibly having a reaction to medication , or something, there was no Nurse available.  I advised the pt to go to the Urgent Care--there is only one provider in the office.

## 2021-09-03 NOTE — Telephone Encounter (Signed)
He said hes not sure its a reaction, just feels like something is crawling on his skin and very itchy. Will need an in office appt to be evaluated this week

## 2021-09-03 NOTE — Telephone Encounter (Signed)
Pt states about a week ago he started itching on his eyelids and itching on his neck and chest. Seems to be getting worse but no swelling in face/lips/tongue, no SOB. Michela Pitcher he started the Descovy about a month ago and thinks it may be due to that. What does he need to do? Should he stop that med and see if the symptoms get better or keep taking and come in for appt? Aware to go to ER if conditions worsens

## 2021-09-11 ENCOUNTER — Other Ambulatory Visit: Payer: Self-pay

## 2021-09-11 ENCOUNTER — Ambulatory Visit (INDEPENDENT_AMBULATORY_CARE_PROVIDER_SITE_OTHER): Payer: Medicare Other | Admitting: Family Medicine

## 2021-09-11 ENCOUNTER — Encounter: Payer: Self-pay | Admitting: Family Medicine

## 2021-09-11 VITALS — BP 120/65 | HR 97 | Resp 16 | Ht 69.0 in | Wt 162.8 lb

## 2021-09-11 DIAGNOSIS — Z7252 High risk homosexual behavior: Secondary | ICD-10-CM | POA: Diagnosis not present

## 2021-09-11 DIAGNOSIS — L309 Dermatitis, unspecified: Secondary | ICD-10-CM | POA: Diagnosis not present

## 2021-09-11 MED ORDER — CETIRIZINE HCL 10 MG PO TABS: 10.0000 mg | ORAL_TABLET | Freq: Every day | ORAL | 3 refills | Status: DC

## 2021-09-11 MED ORDER — PREDNISONE 5 MG PO TABS: 5.0000 mg | ORAL_TABLET | Freq: Two times a day (BID) | ORAL | 0 refills | Status: AC

## 2021-09-11 NOTE — Patient Instructions (Addendum)
F/U with Dr Posey Pronto as befoe Call if you need to be seen sooner  Prednisone and zyrtec are prescribed for itching around eyes and on neck  You are also referred to Dermatology in next 1 to 2 weeks, if your symptoms resolve then you may cancel   Thanks for choosing [facility], we consider it a privelige to serve you.

## 2021-09-17 ENCOUNTER — Encounter: Payer: Self-pay | Admitting: Family Medicine

## 2021-09-17 DIAGNOSIS — L309 Dermatitis, unspecified: Secondary | ICD-10-CM | POA: Insufficient documentation

## 2021-09-17 NOTE — Assessment & Plan Note (Signed)
Advised toi continue vescovy, allergic reaction to drug or food would not cause localized symptoms

## 2021-09-17 NOTE — Assessment & Plan Note (Signed)
Allergy based dermatitis affecting supraorbital skin and neck, short courbe of prednisone and daily zyrtec

## 2021-09-17 NOTE — Progress Notes (Signed)
   Francis [name]     MRN: [id number]      DOB: 11/11/1970   HPI Mr. Belmar is here c/o itchy neck and upper eyelids with supraorbital swelling that started 09/21/2021. He was started on vescovy on 08/02/2021 and is concerned that this may be triggering the symptoms. I explained that a reaction to oral medication would not be so localized so he should continue the medication. Denies tongue swelling, difficulty breathing or any other significant symptoms of allergic reaction Denies vision change , eye pain or drainage from the eye ROS Denies recent fever or chills. C/o increased , nasal congestion with post nasal drainage, denies  ear pain or sore throat. Denies chest congestion, productive cough or wheezing. Denies chest pains, palpitations and leg swelling Denies abdominal pain, nausea, vomiting,diarrhea or constipation.   Denies dysuria, frequency, hesitancy or incontinence. Denies joint pain, swelling and limitation in mobility. Denies headaches, seizures, numbness, or tingling. Denies depression, anxiety or insomnia. PE  BP 120/65   Pulse 97   Resp 16   Ht 5\' 9"  (1.753 m)   Wt 162 lb 12.8 oz (73.8 kg)   SpO2 97%   BMI 24.04 kg/m   Patient alert and oriented and in no cardiopulmonary distress.  HEENT: No facial asymmetry, EOMI,     Neck supple . Mild supraorbital swelling Chest: Clear to auscultation bilaterally.  CVS: S1, S2 no murmurs, no S3.Regular rate.  ABD: Soft non tender.   Ext: No edema  MS: Adequate ROM spine, shoulders, hips and knees.  Skin: Intact, mildly erythematous papular rash on neck and supraorbital swelling and mild eryhtema  Psych: Good eye contact, normal affect. Memory intact not anxious or depressed appearing.  CNS: CN 2-12 intact, power,  normal throughout.no focal deficits noted.   Assessment & Plan  Dermatitis Allergy based dermatitis affecting supraorbital skin and neck, short courbe of prednisone and daily zyrtec  High risk  sexual behavior Advised toi continue vescovy, allergic reaction to drug or food would not cause localized symptoms

## 2021-09-25 ENCOUNTER — Other Ambulatory Visit: Payer: Self-pay | Admitting: *Deleted

## 2021-09-25 ENCOUNTER — Telehealth: Payer: Self-pay

## 2021-09-25 MED ORDER — BACLOFEN 20 MG PO TABS: ORAL_TABLET | ORAL | 1 refills | Status: DC

## 2021-09-25 NOTE — Telephone Encounter (Signed)
Patient called need med refill baclofen (LIORESAL) 20 MG tablet.  Pharmacy will not refill.  Patient has been taking this medicine for 3 years now and needs this medicine.  Pharmacy: [facility]

## 2021-09-25 NOTE — Telephone Encounter (Signed)
Medication sent to pharmacy  

## 2021-10-11 ENCOUNTER — Ambulatory Visit: Payer: Medicare Other | Admitting: Nurse Practitioner

## 2021-10-15 ENCOUNTER — Ambulatory Visit: Payer: Medicare Other | Admitting: Internal Medicine

## 2021-10-16 ENCOUNTER — Encounter: Payer: Self-pay | Admitting: Internal Medicine

## 2021-10-16 ENCOUNTER — Other Ambulatory Visit: Payer: Self-pay

## 2021-10-16 ENCOUNTER — Ambulatory Visit (INDEPENDENT_AMBULATORY_CARE_PROVIDER_SITE_OTHER): Payer: Medicare Other | Admitting: Internal Medicine

## 2021-10-16 VITALS — BP 128/84 | HR 76 | Resp 18 | Ht 69.0 in | Wt 165.0 lb

## 2021-10-16 DIAGNOSIS — L309 Dermatitis, unspecified: Secondary | ICD-10-CM | POA: Diagnosis not present

## 2021-10-16 MED ORDER — TRIAMCINOLONE ACETONIDE 0.1 % EX CREA: 1.0000 "application " | TOPICAL_CREAM | Freq: Two times a day (BID) | CUTANEOUS | 0 refills | Status: DC
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
## 2021-10-16 NOTE — Patient Instructions (Signed)
Please start applying Kenalog cream for itching and rash.  Please stop taking Descovy for now and contact after 1 week to update about rash.

## 2021-10-16 NOTE — Assessment & Plan Note (Signed)
Unclear whether contact dermatitis vs medication allergy Has completed Prednisone Takes Zyrtec Kenalog cream for now Had been referred to Dr Nevada Crane, but has not seen them yet. Advised to stop Descovy for now and will see response - will switch to Truvada if rash improves

## 2021-10-23 ENCOUNTER — Other Ambulatory Visit: Payer: Self-pay | Admitting: Internal Medicine

## 2021-10-23 ENCOUNTER — Telehealth: Payer: Self-pay

## 2021-10-23 DIAGNOSIS — Z717 Human immunodeficiency virus [HIV] counseling: Secondary | ICD-10-CM

## 2021-10-23 DIAGNOSIS — Z7252 High risk homosexual behavior: Secondary | ICD-10-CM

## 2021-10-23 MED ORDER — EMTRICITABINE-TENOFOVIR DF 200-300 MG PO TABS: 1.0000 | ORAL_TABLET | Freq: Every day | ORAL | 11 refills | Status: DC

## 2021-10-23 NOTE — Telephone Encounter (Signed)
Patient called and said 12/20 cream worked good and the rash is gone.  "Follow up AOK"  "where do I go from here?"

## 2021-10-24 NOTE — Telephone Encounter (Signed)
LVM for pt to call the office.

## 2021-10-24 NOTE — Telephone Encounter (Signed)
Pt does not need refill as this time advised with verbal understanding

## 2021-10-24 NOTE — Telephone Encounter (Signed)
Pt doesn't think it is dexcovy causing rash the cream has worked great the rash is gone and it doesn't itch anymore he doesn't know why you need to change the medication as he doesn't know that that is what caused it please advise

## 2021-10-24 NOTE — Telephone Encounter (Signed)
Please call the pt when you have time

## 2021-10-25 ENCOUNTER — Other Ambulatory Visit: Payer: Self-pay | Admitting: Internal Medicine

## 2021-11-05 ENCOUNTER — Ambulatory Visit: Payer: Medicare Other | Admitting: Nurse Practitioner

## 2021-11-06 ENCOUNTER — Ambulatory Visit: Payer: Medicare Other | Admitting: Internal Medicine

## 2021-11-07 ENCOUNTER — Ambulatory Visit: Payer: Medicare Other | Admitting: Internal Medicine

## 2021-11-19 ENCOUNTER — Other Ambulatory Visit: Payer: Self-pay | Admitting: Internal Medicine

## 2021-12-13 ENCOUNTER — Other Ambulatory Visit: Payer: Self-pay

## 2021-12-13 ENCOUNTER — Ambulatory Visit (INDEPENDENT_AMBULATORY_CARE_PROVIDER_SITE_OTHER): Payer: Medicare Other

## 2021-12-13 DIAGNOSIS — Z0000 Encounter for general adult medical examination without abnormal findings: Secondary | ICD-10-CM
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
## 2021-12-13 NOTE — Patient Instructions (Signed)
°  Francis [name] , Thank you for taking time to come for your Medicare Wellness Visit. I appreciate your ongoing commitment to your health goals. Please review the following plan we discussed and let me know if I can assist you in the future.   These are the goals we discussed:  Goals   None     This is a list of the screening recommended for you and due dates:  Health Maintenance  Topic Date Due   Tetanus Vaccine  Never done   Zoster (Shingles) Vaccine (1 of 2) Never done   Flu Shot  01/25/2022*   COVID-19 Vaccine (1) 09/27/2022*   Colon Cancer Screening  11/05/2025   Hepatitis C Screening: USPSTF Recommendation to screen - Ages 18-79 yo.  Completed   HIV Screening  Completed   HPV Vaccine  Aged Out  *Topic was postponed. The date shown is not the original due date.

## 2021-12-21 ENCOUNTER — Other Ambulatory Visit: Payer: Self-pay

## 2021-12-21 ENCOUNTER — Ambulatory Visit
Admission: EM | Admit: 2021-12-21 | Discharge: 2021-12-21 | Disposition: A | Discharge status: Home / Self Care | Payer: Medicare Other | Attending: Family Medicine | Admitting: Family Medicine

## 2021-12-21 ENCOUNTER — Encounter: Payer: Self-pay | Admitting: Emergency Medicine

## 2021-12-21 DIAGNOSIS — G5701 Lesion of sciatic nerve, right lower limb: Secondary | ICD-10-CM | POA: Diagnosis not present

## 2021-12-21 MED ORDER — PREDNISONE 10 MG PO TABS: ORAL_TABLET | ORAL | 0 refills | Status: DC

## 2021-12-21 MED ORDER — DEXAMETHASONE SODIUM PHOSPHATE 10 MG/ML IJ SOLN
10.0000 mg | Freq: Once | INTRAMUSCULAR | Status: AC
  Administered 2021-12-21: 10 mg via INTRAMUSCULAR

## 2021-12-21 NOTE — ED Triage Notes (Signed)
Right hip and leg pain x 4 days.  States right leg feels weak.  Has been taking aleve for pain without relief.  Has been taking a muscle relaxer without relief.
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
## 2021-12-25 ENCOUNTER — Encounter (HOSPITAL_COMMUNITY): Payer: Self-pay | Admitting: Emergency Medicine

## 2021-12-25 ENCOUNTER — Telehealth: Payer: Self-pay

## 2021-12-25 ENCOUNTER — Emergency Department (HOSPITAL_COMMUNITY): Payer: Medicare Other

## 2021-12-25 ENCOUNTER — Other Ambulatory Visit: Payer: Self-pay | Admitting: Internal Medicine

## 2021-12-25 ENCOUNTER — Emergency Department (HOSPITAL_COMMUNITY)
Admission: EM | Admit: 2021-12-25 | Discharge: 2021-12-25 | Disposition: A | Discharge status: Home / Self Care | Payer: Medicare Other | Attending: Emergency Medicine | Admitting: Emergency Medicine

## 2021-12-25 ENCOUNTER — Other Ambulatory Visit: Payer: Self-pay

## 2021-12-25 DIAGNOSIS — R0600 Dyspnea, unspecified: Secondary | ICD-10-CM | POA: Diagnosis not present

## 2021-12-25 DIAGNOSIS — R110 Nausea: Secondary | ICD-10-CM | POA: Diagnosis not present

## 2021-12-25 DIAGNOSIS — Z79899 Other long term (current) drug therapy: Secondary | ICD-10-CM | POA: Insufficient documentation

## 2021-12-25 DIAGNOSIS — R000 Tachycardia, unspecified: Secondary | ICD-10-CM | POA: Insufficient documentation

## 2021-12-25 DIAGNOSIS — R0789 Other chest pain: Secondary | ICD-10-CM | POA: Insufficient documentation

## 2021-12-25 DIAGNOSIS — G4701 Insomnia due to medical condition: Secondary | ICD-10-CM

## 2021-12-25 DIAGNOSIS — R42 Dizziness and giddiness: Secondary | ICD-10-CM | POA: Insufficient documentation

## 2021-12-25 HISTORY — DX: Tachycardia, unspecified: R00.0

## 2021-12-25 LAB — CBC
HCT: 47 % (ref 39.0–52.0)
Hemoglobin: 15.5 g/dL (ref 13.0–17.0)
MCH: 28.8 pg (ref 26.0–34.0)
MCHC: 33 g/dL (ref 30.0–36.0)
MCV: 87.4 fL (ref 80.0–100.0)
Platelets: 219 10*3/uL (ref 150–400)
RBC: 5.38 MIL/uL (ref 4.22–5.81)
RDW: 13.1 % (ref 11.5–15.5)
WBC: 7.6 10*3/uL (ref 4.0–10.5)
nRBC: 0 % (ref 0.0–0.2)

## 2021-12-25 LAB — BASIC METABOLIC PANEL
Anion gap: 13 (ref 5–15)
BUN: 33 mg/dL — ABNORMAL HIGH (ref 6–20)
CO2: 22 mmol/L (ref 22–32)
Calcium: 10 mg/dL (ref 8.9–10.3)
Chloride: 103 mmol/L (ref 98–111)
Creatinine, Ser: 1.1 mg/dL (ref 0.61–1.24)
GFR, Estimated: >60 mL/min (ref >60)
Glucose, Bld: 108 mg/dL — ABNORMAL HIGH (ref 70–99)
Potassium: 3.7 mmol/L (ref 3.5–5.1)
Sodium: 138 mmol/L (ref 135–145)

## 2021-12-25 LAB — TROPONIN I (HIGH SENSITIVITY)
Troponin I (High Sensitivity): <2 ng/L (ref <18)
Troponin I (High Sensitivity): <2 ng/L (ref <18)

## 2021-12-25 LAB — D-DIMER, QUANTITATIVE: D-Dimer, Quant: <0.27 ug/mL-FEU (ref 0.00–0.50)

## 2021-12-25 MED ORDER — MECLIZINE HCL 12.5 MG PO TABS
25.0000 mg | ORAL_TABLET | Freq: Once | ORAL | Status: AC
  Administered 2021-12-25: 25 mg via ORAL
  Filled 2021-12-25: qty 2

## 2021-12-25 MED ORDER — TRAZODONE HCL 50 MG PO TABS: 25.0000 mg | ORAL_TABLET | Freq: Every evening | ORAL | 1 refills | Status: DC | PRN

## 2021-12-25 MED ORDER — SODIUM CHLORIDE 0.9 % IV BOLUS
1000.0000 mL | Freq: Once | INTRAVENOUS | Status: AC
  Administered 2021-12-25: 1000 mL via INTRAVENOUS
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
## 2021-12-25 NOTE — ED Notes (Signed)
Pt reports symptoms improving but still feels the tingling/pins and needles down arms bilaterally and sides of face. No numbness, no chest pain. BP stabilizing, pt appears to be much calmer at this time

## 2021-12-25 NOTE — ED Triage Notes (Signed)
Pt c/ waking this am with tingling in his chest/sob/dizziness/"funny in head"/nausea and sweating. Pt arrived nad, palms sweaty, mild labored breathing. Pt appears anxious. Denies pain/tightness or heaviness in chest. Pt gait steady.

## 2021-12-25 NOTE — Telephone Encounter (Signed)
Patient called need med refill traZODone (DESYREL) 50 MG tablet [place]

## 2021-12-25 NOTE — Discharge Instructions (Signed)
It was a pleasure caring for you today in the emergency department. ° °Please return to the emergency department for any worsening or worrisome symptoms. ° ° °

## 2022-01-01 ENCOUNTER — Telehealth: Payer: Medicare Other | Admitting: Internal Medicine

## 2022-01-10 ENCOUNTER — Other Ambulatory Visit: Payer: Self-pay | Admitting: *Deleted

## 2022-01-14 ENCOUNTER — Other Ambulatory Visit: Payer: Self-pay

## 2022-01-14 ENCOUNTER — Encounter: Payer: Self-pay | Admitting: Internal Medicine

## 2022-01-14 ENCOUNTER — Ambulatory Visit (INDEPENDENT_AMBULATORY_CARE_PROVIDER_SITE_OTHER): Payer: Medicare Other | Admitting: Internal Medicine

## 2022-01-14 VITALS — BP 132/80 | HR 101 | Ht 69.0 in | Wt 158.0 lb

## 2022-01-14 DIAGNOSIS — K219 Gastro-esophageal reflux disease without esophagitis: Secondary | ICD-10-CM

## 2022-01-14 DIAGNOSIS — Z09 Encounter for follow-up examination after completed treatment for conditions other than malignant neoplasm: Secondary | ICD-10-CM

## 2022-01-14 DIAGNOSIS — F411 Generalized anxiety disorder: Secondary | ICD-10-CM

## 2022-01-14 DIAGNOSIS — M5431 Sciatica, right side: Secondary | ICD-10-CM | POA: Diagnosis not present

## 2022-01-14 MED ORDER — NAPROXEN 500 MG PO TABS: 500.0000 mg | ORAL_TABLET | Freq: Two times a day (BID) | ORAL | 2 refills | Status: DC

## 2022-01-14 MED ORDER — HYDROXYZINE PAMOATE 25 MG PO CAPS: 25.0000 mg | ORAL_CAPSULE | Freq: Three times a day (TID) | ORAL | 2 refills | Status: DC | PRN

## 2022-01-14 MED ORDER — OMEPRAZOLE 40 MG PO CPDR: 40.0000 mg | DELAYED_RELEASE_CAPSULE | Freq: Every day | ORAL | 1 refills | Status: DC | PRN

## 2022-01-14 NOTE — Assessment & Plan Note (Signed)
ER chart reviewed, including imaging ?Prednisone was given in urgent care for low back pain ?

## 2022-01-14 NOTE — Patient Instructions (Signed)
Please start taking Naproxen for back pain. You are being referred to Spine surgery. ? ?Please avoid heavy lifting or frequent bending. ? ?Please start taking Vistaril as needed for anxiety. ?

## 2022-01-14 NOTE — Addendum Note (Signed)
Addended by: Quentin Angst on: 01/14/2022 03:59 PM ? ? Modules accepted: Orders ? ?

## 2022-01-14 NOTE — Addendum Note (Signed)
Addended byIhor Dow on: 01/14/2022 04:57 PM ? ? Modules accepted: Orders ? ?
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
## 2022-01-14 NOTE — Assessment & Plan Note (Signed)
Uncontrolled currently ?Has tried Zoloft and Xanax in the past amongst others ?Was on lithium for bipolar disorder in the past ?Denies to see psychiatry ?Continue trazodone for insomnia ?Added Vistaril as needed for anxiety ?

## 2022-01-14 NOTE — Assessment & Plan Note (Addendum)
Atraumatic ?Could be herniated disc or DDD of the lumbar spine ?Naproxen as needed for now ?Referred to spine surgery ?Advised to avoid heavy lifting and frequent bending ?Has had urgent care visit, was given prednisone, did not help much ?

## 2022-01-23 ENCOUNTER — Other Ambulatory Visit (HOSPITAL_COMMUNITY): Payer: Self-pay | Admitting: Neurosurgery

## 2022-01-23 ENCOUNTER — Other Ambulatory Visit: Payer: Self-pay | Admitting: Neurosurgery

## 2022-01-23 DIAGNOSIS — M5416 Radiculopathy, lumbar region: Secondary | ICD-10-CM

## 2022-02-07 ENCOUNTER — Encounter: Payer: Self-pay | Admitting: Internal Medicine

## 2022-02-07 ENCOUNTER — Ambulatory Visit (INDEPENDENT_AMBULATORY_CARE_PROVIDER_SITE_OTHER): Payer: Medicare Other | Admitting: Internal Medicine

## 2022-02-07 DIAGNOSIS — F410 Panic disorder [episodic paroxysmal anxiety] without agoraphobia: Secondary | ICD-10-CM | POA: Diagnosis not present

## 2022-02-07 DIAGNOSIS — F411 Generalized anxiety disorder: Secondary | ICD-10-CM

## 2022-02-07 MED ORDER — ALPRAZOLAM 0.5 MG PO TABS: 0.5000 mg | ORAL_TABLET | Freq: Every evening | ORAL | 0 refills | Status: DC | PRN

## 2022-02-07 NOTE — Patient Instructions (Signed)
Please take Xanax as needed for severe anxiety/panic episode. ?
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
## 2022-02-07 NOTE — Assessment & Plan Note (Signed)
Uncontrolled currently ?Has tried Zoloft and Xanax in the past amongst others ?Was on lithium for bipolar disorder in the past ?Denies to see psychiatry ?Continue trazodone for insomnia ?Added Vistaril as needed for anxiety, but did not see any improvement ?Started Xanax 0.5 mg qHS PRN for now ?

## 2022-02-07 NOTE — Assessment & Plan Note (Signed)
Has tried multiple anxiolytics in the past, including SSRI and propranolol ?Started Xanax as needed for severe anxiety/panic episode ?

## 2022-02-11 ENCOUNTER — Ambulatory Visit (HOSPITAL_COMMUNITY)
Admission: RE | Admit: 2022-02-11 | Discharge: 2022-02-11 | Disposition: A | Discharge status: Home / Self Care | Payer: Medicare Other | Source: Ambulatory Visit | Attending: Neurosurgery | Admitting: Neurosurgery

## 2022-02-11 DIAGNOSIS — M5416 Radiculopathy, lumbar region: Secondary | ICD-10-CM | POA: Diagnosis present

## 2022-02-21 ENCOUNTER — Ambulatory Visit (INDEPENDENT_AMBULATORY_CARE_PROVIDER_SITE_OTHER): Payer: Medicare Other | Admitting: Gastroenterology

## 2022-02-21 ENCOUNTER — Encounter (INDEPENDENT_AMBULATORY_CARE_PROVIDER_SITE_OTHER): Payer: Self-pay | Admitting: Gastroenterology

## 2022-02-21 VITALS — BP 126/81 | HR 76 | Temp 98.4°F | Ht 69.0 in | Wt 161.1 lb

## 2022-02-21 DIAGNOSIS — K580 Irritable bowel syndrome with diarrhea: Secondary | ICD-10-CM | POA: Diagnosis not present
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
## 2022-02-21 NOTE — Patient Instructions (Addendum)
Continue baclofen 20 mg TID ?Can try low calorie Boost/Ensure daily ?

## 2022-02-27 ENCOUNTER — Other Ambulatory Visit: Payer: Self-pay | Admitting: *Deleted

## 2022-02-27 MED ORDER — CETIRIZINE HCL 10 MG PO TABS
10.0000 mg | ORAL_TABLET | Freq: Every day | ORAL | 3 refills | Status: DC
Start: 2022-02-27 — End: 2022-07-10

## 2022-03-04 ENCOUNTER — Telehealth: Payer: Self-pay | Admitting: Internal Medicine

## 2022-03-04 ENCOUNTER — Other Ambulatory Visit: Payer: Self-pay | Admitting: Internal Medicine

## 2022-03-04 DIAGNOSIS — F411 Generalized anxiety disorder: Secondary | ICD-10-CM

## 2022-03-04 MED ORDER — ALPRAZOLAM 0.5 MG PO TABS: 0.5000 mg | ORAL_TABLET | Freq: Every evening | ORAL | 2 refills | Status: DC | PRN

## 2022-03-04 NOTE — Telephone Encounter (Signed)
Patient called in for refill on ALPRAZolam (XANAX) 0.5 MG tablet  ? ?And to give update on how med was working. ? ?Patient wants a call back  ?

## 2022-03-04 NOTE — Telephone Encounter (Signed)
Pt stated xanax is working so much better he feels calm in the mornings and wanted to know if this can be refilled  ?

## 2022-03-10 ENCOUNTER — Other Ambulatory Visit: Payer: Self-pay | Admitting: Internal Medicine

## 2022-04-13 ENCOUNTER — Other Ambulatory Visit: Payer: Self-pay | Admitting: Internal Medicine

## 2022-04-20 ENCOUNTER — Other Ambulatory Visit: Payer: Self-pay | Admitting: Internal Medicine

## 2022-04-20 DIAGNOSIS — K219 Gastro-esophageal reflux disease without esophagitis: Secondary | ICD-10-CM

## 2022-05-14 ENCOUNTER — Other Ambulatory Visit: Payer: Self-pay | Admitting: Internal Medicine

## 2022-05-16 ENCOUNTER — Encounter: Payer: Self-pay | Admitting: Internal Medicine

## 2022-05-16 ENCOUNTER — Ambulatory Visit (INDEPENDENT_AMBULATORY_CARE_PROVIDER_SITE_OTHER): Payer: Medicare Other | Admitting: Internal Medicine

## 2022-05-16 VITALS — BP 120/73 | HR 93 | Ht 69.0 in | Wt 155.2 lb

## 2022-05-16 DIAGNOSIS — F411 Generalized anxiety disorder: Secondary | ICD-10-CM

## 2022-05-16 DIAGNOSIS — F17210 Nicotine dependence, cigarettes, uncomplicated: Secondary | ICD-10-CM

## 2022-05-16 DIAGNOSIS — G4701 Insomnia due to medical condition: Secondary | ICD-10-CM

## 2022-05-16 DIAGNOSIS — Z79899 Other long term (current) drug therapy: Secondary | ICD-10-CM

## 2022-05-16 DIAGNOSIS — Z720 Tobacco use: Secondary | ICD-10-CM

## 2022-05-16 DIAGNOSIS — F410 Panic disorder [episodic paroxysmal anxiety] without agoraphobia: Secondary | ICD-10-CM | POA: Diagnosis not present

## 2022-05-16 DIAGNOSIS — K219 Gastro-esophageal reflux disease without esophagitis: Secondary | ICD-10-CM | POA: Diagnosis not present

## 2022-05-16 DIAGNOSIS — M5416 Radiculopathy, lumbar region: Secondary | ICD-10-CM

## 2022-05-16 MED ORDER — VARENICLINE TARTRATE 1 MG PO TABS: 1.0000 mg | ORAL_TABLET | Freq: Two times a day (BID) | ORAL | 2 refills | Status: DC

## 2022-05-16 MED ORDER — TRAZODONE HCL 50 MG PO TABS: 50.0000 mg | ORAL_TABLET | Freq: Every evening | ORAL | 1 refills | Status: DC | PRN

## 2022-05-16 MED ORDER — VARENICLINE TARTRATE 0.5 MG PO TABS: ORAL_TABLET | ORAL | 0 refills | Status: AC

## 2022-05-16 NOTE — Assessment & Plan Note (Signed)
Well controlled with trazodone and as needed Xanax

## 2022-05-16 NOTE — Assessment & Plan Note (Signed)
Followed by spine surgery On Lyrica and as needed Percocet

## 2022-05-16 NOTE — Assessment & Plan Note (Signed)
Has tried multiple anxiolytics in the past, including SSRI and propranolol Started Xanax as needed for severe anxiety/panic episode - now better PDMP reviewed Check urine Toxassure

## 2022-05-16 NOTE — Assessment & Plan Note (Signed)
Well-controlled currently Has tried Zoloft and Xanax in the past amongst others Was on lithium for bipolar disorder in the past Denies to see psychiatry Continue trazodone for insomnia On Xanax 0.5 mg qHS PRN for now

## 2022-05-16 NOTE — Assessment & Plan Note (Addendum)
Smokes about 0.5 pack/day  Asked about quitting: confirms that he/she currently smokes cigarettes Advise to quit smoking: Educated about QUITTING to reduce the risk of cancer, cardio and cerebrovascular disease. Assess willingness: Unwilling to quit at this time, but is working on cutting back. Assist with counseling and pharmacotherapy: Counseled for 5 minutes and literature provided. Started Chantix. Arrange for follow up: follow up in 3 months and continue to offer help.

## 2022-05-16 NOTE — Patient Instructions (Addendum)
Please continue taking medications as prescribed.  Please maintain simple sleep hygiene. - Maintain dark and non-noisy environment in the bedroom. - Please use the bedroom for sleep and sexual activity only. - Do not use electronic devices in the bedroom. - Please take dinner at least 2 hours before bedtime. - Please avoid caffeinated products in the evening, including coffee, soft drinks. - Please try to maintain the regular sleep-wake cycle - Go to bed and wake up at the same time.  Please consider getting Shingrix and Tdap vaccine at your local pharmacy.
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
## 2022-05-16 NOTE — Assessment & Plan Note (Signed)
Well-controlled with Omeprazole 

## 2022-05-21 LAB — TOXASSURE SELECT 13 (MW), URINE

## 2022-06-13 ENCOUNTER — Other Ambulatory Visit: Payer: Self-pay | Admitting: Internal Medicine

## 2022-06-13 DIAGNOSIS — F411 Generalized anxiety disorder: Secondary | ICD-10-CM

## 2022-07-04 ENCOUNTER — Other Ambulatory Visit: Payer: Self-pay | Admitting: Internal Medicine

## 2022-07-04 DIAGNOSIS — G4701 Insomnia due to medical condition: Secondary | ICD-10-CM

## 2022-07-10 ENCOUNTER — Other Ambulatory Visit: Payer: Self-pay | Admitting: Internal Medicine

## 2022-07-23 ENCOUNTER — Other Ambulatory Visit: Payer: Self-pay | Admitting: Internal Medicine

## 2022-07-23 DIAGNOSIS — K219 Gastro-esophageal reflux disease without esophagitis: Secondary | ICD-10-CM

## 2022-08-16 ENCOUNTER — Other Ambulatory Visit (HOSPITAL_COMMUNITY): Payer: Self-pay | Admitting: Neurosurgery

## 2022-08-16 DIAGNOSIS — M5416 Radiculopathy, lumbar region: Secondary | ICD-10-CM

## 2022-08-16 DIAGNOSIS — G959 Disease of spinal cord, unspecified: Secondary | ICD-10-CM

## 2022-08-29 ENCOUNTER — Other Ambulatory Visit: Payer: Self-pay | Admitting: Neurosurgery

## 2022-09-03 ENCOUNTER — Other Ambulatory Visit: Payer: Self-pay | Admitting: Neurosurgery

## 2022-09-09 ENCOUNTER — Ambulatory Visit (HOSPITAL_COMMUNITY): Payer: Medicare Other

## 2022-09-09 ENCOUNTER — Encounter (HOSPITAL_COMMUNITY): Payer: Self-pay

## 2022-09-09 ENCOUNTER — Other Ambulatory Visit: Payer: Self-pay | Admitting: Internal Medicine

## 2022-09-09 NOTE — Progress Notes (Signed)
Surgical Instructions    Your procedure is scheduled on Wednesday, 09/18/22.  Report to [facility] Main Entrance "A" at 11:00 A.M., then check in with the Admitting office.  Call this number if you have problems the morning of surgery:  [phone]   If you have any questions prior to your surgery date call [phone]: Open Monday-Friday 8am-4pm If you experience any cold or flu symptoms such as cough, fever, chills, shortness of breath, etc. between now and your scheduled surgery, please notify us at the above number     Remember:  Do not eat after midnight the night before your surgery  You may drink clear liquids until 10:00am the morning of your surgery.   Clear liquids allowed are: Water, Non-Citrus Juices (without pulp), Carbonated Beverages, Clear Tea, Black Coffee ONLY (NO MILK, CREAM OR POWDERED CREAMER of any kind), and Gatorade    Take these medicines the morning of surgery with A SIP OF WATER:  baclofen (LIORESAL)  cetirizine (ZYRTEC)  methocarbamol (ROBAXIN)  metoprolol succinate (TOPROL-XL)  pregabalin (LYRICA)   IF NEEDED: ALPRAZolam (XANAX)  omeprazole (PRILOSEC)  oxyCODONE-acetaminophen (PERCOCET/ROXICET   As of today, STOP taking any Aspirin (unless otherwise instructed by your surgeon) Aleve, Naproxen, Ibuprofen, Motrin, Advil, Goody's, BC's, all herbal medications, fish oil, and all vitamins.           Do not wear jewelry or makeup. Do not wear lotions, powders, cologne or deodorant. Men may shave face and neck. Do not bring valuables to the hospital. Do not wear nail polish, gel polish, artificial nails, or any other type of covering on natural nails (fingers and toes) If you have artificial nails or gel coating that need to be removed by a nail salon, please have this removed prior to surgery. Artificial nails or gel coating may interfere with anesthesia's ability to adequately monitor your vital signs.  [facility] is not responsible for any  belongings or valuables.    Do NOT Smoke (Tobacco/Vaping)  24 hours prior to your procedure  If you use a CPAP at night, you may bring your mask for your overnight stay.   Contacts, glasses, hearing aids, dentures or partials may not be worn into surgery, please bring cases for these belongings   For patients admitted to the hospital, discharge time will be determined by your treatment team.   Patients discharged the day of surgery will not be allowed to drive home, and someone needs to stay with them for 24 hours.   SURGICAL WAITING ROOM VISITATION Patients having surgery or a procedure may have no more than 2 support people in the waiting area - these visitors may rotate.   Children under the age of 22 must have an adult with them who is not the patient. If the patient needs to stay at the hospital during part of their recovery, the visitor guidelines for inpatient rooms apply. Pre-op nurse will coordinate an appropriate time for 1 support person to accompany patient in pre-op.  This support person may not rotate.   Please refer to [email or web address] for the visitor guidelines for Inpatients (after your surgery is over and you are in a regular room).    Special instructions:    Oral Hygiene is also important to reduce your risk of infection.  Remember - BRUSH YOUR TEETH THE MORNING OF SURGERY WITH YOUR REGULAR TOOTHPASTE   [place]- Preparing For Surgery  Before surgery, you can play an important role. Because skin is not sterile, your skin needs to  be as free of germs as possible. You can reduce the number of germs on your skin by washing with CHG (chlorahexidine gluconate) Soap before surgery.  CHG is an antiseptic cleaner which kills germs and bonds with the skin to continue killing germs even after washing.     Please do not use if you have an allergy to CHG or antibacterial soaps. If your skin becomes reddened/irritated stop  using the CHG.  Do not shave (including legs and underarms) for at least 48 hours prior to first CHG shower. It is OK to shave your face.  Please follow these instructions carefully.     Shower the NIGHT BEFORE SURGERY and the MORNING OF SURGERY with CHG Soap.   If you chose to wash your hair, wash your hair first as usual with your normal shampoo. After you shampoo, rinse your hair and body thoroughly to remove the shampoo.  Then [employer] and genitals (private parts) with your normal soap and rinse thoroughly to remove soap.  After that Use CHG Soap as you would any other liquid soap. You can apply CHG directly to the skin and wash gently with a scrungie or a clean washcloth.   Apply the CHG Soap to your body ONLY FROM THE NECK DOWN.  Do not use on open wounds or open sores. Avoid contact with your eyes, ears, mouth and genitals (private parts). Wash Face and genitals (private parts)  with your normal soap.   Wash thoroughly, paying special attention to the area where your surgery will be performed.  Thoroughly rinse your body with warm water from the neck down.  DO NOT shower/wash with your normal soap after using and rinsing off the CHG Soap.  Pat yourself dry with a CLEAN TOWEL.  Wear CLEAN PAJAMAS to bed the night before surgery  Place CLEAN SHEETS on your bed the night before your surgery  DO NOT SLEEP WITH PETS.   Day of Surgery: Take a shower with CHG soap. Wear Clean/Comfortable clothing the morning of surgery Do not apply any deodorants/lotions.   Remember to brush your teeth WITH YOUR REGULAR TOOTHPASTE.    If you received a COVID test during your pre-op visit, it is requested that you wear a mask when out in public, stay away from anyone that may not be feeling well, and notify your surgeon if you develop symptoms. If you have been in contact with anyone that has tested positive in the last 10 days, please notify your surgeon.    Please read over the following fact  sheets that you were given.

## 2022-09-10 ENCOUNTER — Encounter (HOSPITAL_COMMUNITY)
Admission: RE | Admit: 2022-09-10 | Discharge: 2022-09-10 | Disposition: A | Discharge status: Home / Self Care | Payer: Medicare Other | Source: Ambulatory Visit | Attending: Neurosurgery | Admitting: Neurosurgery

## 2022-09-10 ENCOUNTER — Encounter (HOSPITAL_COMMUNITY): Payer: Self-pay

## 2022-09-10 ENCOUNTER — Other Ambulatory Visit: Payer: Self-pay

## 2022-09-10 VITALS — BP 117/83 | HR 100 | Temp 97.6°F | Resp 18 | Ht 69.0 in | Wt 164.9 lb

## 2022-09-10 DIAGNOSIS — Z9889 Other specified postprocedural states: Secondary | ICD-10-CM

## 2022-09-10 DIAGNOSIS — Z01812 Encounter for preprocedural laboratory examination: Secondary | ICD-10-CM | POA: Diagnosis present

## 2022-09-10 DIAGNOSIS — Z01818 Encounter for other preprocedural examination: Secondary | ICD-10-CM

## 2022-09-10 HISTORY — DX: Gastro-esophageal reflux disease without esophagitis: K21.9

## 2022-09-10 LAB — SURGICAL PCR SCREEN
MRSA, PCR: NEGATIVE
Staphylococcus aureus: NEGATIVE

## 2022-09-10 LAB — CBC
HCT: 44.6 % (ref 39.0–52.0)
Hemoglobin: 14.4 g/dL (ref 13.0–17.0)
MCH: 27.7 pg (ref 26.0–34.0)
MCHC: 32.3 g/dL (ref 30.0–36.0)
MCV: 85.9 fL (ref 80.0–100.0)
Platelets: 180 10*3/uL (ref 150–400)
RBC: 5.19 MIL/uL (ref 4.22–5.81)
RDW: 13.3 % (ref 11.5–15.5)
WBC: 6 10*3/uL (ref 4.0–10.5)
nRBC: 0 % (ref 0.0–0.2)

## 2022-09-10 LAB — COMPREHENSIVE METABOLIC PANEL
ALT: 66 U/L — ABNORMAL HIGH (ref 0–44)
AST: 33 U/L (ref 15–41)
Albumin: 4.3 g/dL (ref 3.5–5.0)
Alkaline Phosphatase: 67 U/L (ref 38–126)
Anion gap: 8 (ref 5–15)
BUN: 19 mg/dL (ref 6–20)
CO2: 27 mmol/L (ref 22–32)
Calcium: 9.7 mg/dL (ref 8.9–10.3)
Chloride: 106 mmol/L (ref 98–111)
Creatinine, Ser: 1.16 mg/dL (ref 0.61–1.24)
GFR, Estimated: >60 mL/min (ref >60)
Glucose, Bld: 116 mg/dL — ABNORMAL HIGH (ref 70–99)
Potassium: 4.3 mmol/L (ref 3.5–5.1)
Sodium: 141 mmol/L (ref 135–145)
Total Bilirubin: 0.7 mg/dL (ref 0.3–1.2)
Total Protein: 7.1 g/dL (ref 6.5–8.1)

## 2022-09-10 LAB — TYPE AND SCREEN
ABO/RH(D): B POS
Antibody Screen: NEGATIVE

## 2022-09-10 NOTE — Progress Notes (Signed)
PCP - Ihor Dow MD Cardiologist - denies  PPM/ICD - denies  Chest x-ray - denies EKG - 12/25/21- patient states it was likely a panic attack Stress Test - denies ECHO - 01/31/21 Cardiac Cath - denies  Sleep Study - yes + OSA CPAP - no  No diabetes.   As of today, STOP taking any Aspirin (unless otherwise instructed by your surgeon) Aleve, Naproxen, Ibuprofen, Motrin, Advil, Goody's, BC's, all herbal medications, fish oil, and all vitamins.   ERAS Protcol - yes PRE-SURGERY Ensure or G2- no  COVID TEST- n/a  Anesthesia review: no  Patient OK with receiving blood transfusion now.  Patient denies shortness of breath, fever, cough and chest pain at PAT appointment  All instructions explained to the patient, with a verbal understanding of the material. Patient agrees to go over the instructions while at home for a better understanding. The opportunity to ask questions was provided.

## 2022-09-12 ENCOUNTER — Other Ambulatory Visit: Payer: Self-pay | Admitting: Internal Medicine

## 2022-09-12 DIAGNOSIS — F411 Generalized anxiety disorder: Secondary | ICD-10-CM

## 2022-09-13 ENCOUNTER — Other Ambulatory Visit: Payer: Self-pay | Admitting: Neurosurgery

## 2022-09-16 ENCOUNTER — Other Ambulatory Visit: Payer: Self-pay | Admitting: Family Medicine

## 2022-09-16 DIAGNOSIS — F411 Generalized anxiety disorder: Secondary | ICD-10-CM

## 2022-09-16 MED ORDER — ALPRAZOLAM 0.5 MG PO TABS: ORAL_TABLET | ORAL | 0 refills | Status: DC

## 2022-09-16 NOTE — Telephone Encounter (Signed)
Pt called in to check on this refill to be sent to [facility] on [address] please advised

## 2022-09-16 NOTE — Telephone Encounter (Signed)
Pt aware.

## 2022-09-16 NOTE — Telephone Encounter (Signed)
Rx sent 

## 2022-09-24 ENCOUNTER — Encounter: Payer: Medicare Other | Admitting: Internal Medicine

## 2022-09-24 NOTE — Progress Notes (Signed)
Patient was called multiple times with questions and instructions for the surgery day with no answer. Patient's brother has the voicemail box full and I was unable to leave a message on his phone.  Patient surgery was rescheduled for 09/25/22; this writer left a message on patient's cell-phone and encouraged him to follow the instructions that he received in PAT on 09/10/22.  Patient was instructed to be at [facility] main entrance tomorrow afternoon at 13:20 o'clock and got to the admitting office.  Patient was instructed to not eat after midnight but to drink clear liquids until 12:17 o'clock tomorrow.  This writer left a phone number for call back.

## 2022-09-25 ENCOUNTER — Observation Stay (HOSPITAL_COMMUNITY)
Admission: RE | Admit: 2022-09-25 | Discharge: 2022-09-26 | Disposition: A | Discharge status: Home / Self Care | Payer: Medicare Other | Attending: Neurosurgery | Admitting: Neurosurgery

## 2022-09-25 ENCOUNTER — Other Ambulatory Visit: Payer: Self-pay

## 2022-09-25 ENCOUNTER — Encounter (HOSPITAL_COMMUNITY)
Admission: RE | Disposition: A | Discharge status: Home / Self Care | Payer: Self-pay | Source: Home / Self Care | Attending: Neurosurgery

## 2022-09-25 ENCOUNTER — Ambulatory Visit (HOSPITAL_BASED_OUTPATIENT_CLINIC_OR_DEPARTMENT_OTHER): Payer: Medicare Other | Admitting: Anesthesiology

## 2022-09-25 ENCOUNTER — Ambulatory Visit (HOSPITAL_COMMUNITY): Payer: Medicare Other

## 2022-09-25 ENCOUNTER — Encounter (HOSPITAL_COMMUNITY): Payer: Self-pay

## 2022-09-25 ENCOUNTER — Ambulatory Visit (HOSPITAL_COMMUNITY): Payer: Medicare Other | Admitting: Anesthesiology

## 2022-09-25 DIAGNOSIS — Z79899 Other long term (current) drug therapy: Secondary | ICD-10-CM | POA: Insufficient documentation

## 2022-09-25 DIAGNOSIS — F17210 Nicotine dependence, cigarettes, uncomplicated: Secondary | ICD-10-CM

## 2022-09-25 DIAGNOSIS — M4316 Spondylolisthesis, lumbar region: Secondary | ICD-10-CM | POA: Insufficient documentation

## 2022-09-25 DIAGNOSIS — M5416 Radiculopathy, lumbar region: Secondary | ICD-10-CM | POA: Diagnosis not present

## 2022-09-25 DIAGNOSIS — G4730 Sleep apnea, unspecified: Secondary | ICD-10-CM | POA: Diagnosis not present

## 2022-09-25 DIAGNOSIS — I10 Essential (primary) hypertension: Secondary | ICD-10-CM

## 2022-09-25 HISTORY — PX: TRANSFORAMINAL LUMBAR INTERBODY FUSION W/ MIS 1 LEVEL: SHX6145

## 2022-09-25 LAB — TYPE AND SCREEN
ABO/RH(D): B POS
Antibody Screen: NEGATIVE

## 2022-09-25 SURGERY — MINIMALLY INVASIVE (MIS) TRANSFORAMINAL LUMBAR INTERBODY FUSION (TLIF) 1 LEVEL
Anesthesia: General | Laterality: Right

## 2022-09-25 MED ORDER — 0.9 % SODIUM CHLORIDE (POUR BTL) OPTIME
TOPICAL | Status: DC | PRN
  Administered 2022-09-25: 1000 mL

## 2022-09-25 MED ORDER — LIDOCAINE-EPINEPHRINE 1 %-1:100000 IJ SOLN
INTRAMUSCULAR | Status: AC
  Filled 2022-09-25: qty 1

## 2022-09-25 MED ORDER — MIDAZOLAM HCL 2 MG/2ML IJ SOLN
INTRAMUSCULAR | Status: AC
  Filled 2022-09-25: qty 2

## 2022-09-25 MED ORDER — SUGAMMADEX SODIUM 200 MG/2ML IV SOLN
INTRAVENOUS | Status: DC | PRN
  Administered 2022-09-25: 200 mg via INTRAVENOUS

## 2022-09-25 MED ORDER — METOPROLOL SUCCINATE 12.5 MG HALF TABLET
12.5000 mg | ORAL_TABLET | Freq: Every day | ORAL | Status: DC
  Administered 2022-09-25: 12.5 mg via ORAL
  Filled 2022-09-25 (×2): qty 1

## 2022-09-25 MED ORDER — FENTANYL CITRATE (PF) 250 MCG/5ML IJ SOLN
INTRAMUSCULAR | Status: AC
  Filled 2022-09-25: qty 5

## 2022-09-25 MED ORDER — MENTHOL 3 MG MT LOZG: 1.0000 | LOZENGE | OROMUCOSAL | Status: DC | PRN

## 2022-09-25 MED ORDER — PROPOFOL 10 MG/ML IV BOLUS
INTRAVENOUS | Status: AC
  Filled 2022-09-25: qty 40

## 2022-09-25 MED ORDER — SODIUM CHLORIDE 0.9 % IV SOLN: 250.0000 mL | INTRAVENOUS | Status: DC

## 2022-09-25 MED ORDER — PREGABALIN 100 MG PO CAPS
100.0000 mg | ORAL_CAPSULE | Freq: Two times a day (BID) | ORAL | Status: DC
  Administered 2022-09-25: 100 mg via ORAL
  Filled 2022-09-25 (×2): qty 1

## 2022-09-25 MED ORDER — ONDANSETRON HCL 4 MG/2ML IJ SOLN: 4.0000 mg | Freq: Four times a day (QID) | INTRAMUSCULAR | Status: DC | PRN

## 2022-09-25 MED ORDER — SODIUM CHLORIDE 0.9% FLUSH: 3.0000 mL | INTRAVENOUS | Status: DC | PRN

## 2022-09-25 MED ORDER — BUPIVACAINE LIPOSOME 1.3 % IJ SUSP
INTRAMUSCULAR | Status: DC | PRN
  Administered 2022-09-25: 20 mL

## 2022-09-25 MED ORDER — ALPRAZOLAM 0.5 MG PO TABS: 0.5000 mg | ORAL_TABLET | Freq: Every evening | ORAL | Status: DC | PRN

## 2022-09-25 MED ORDER — OXYCODONE HCL 5 MG/5ML PO SOLN: 5.0000 mg | Freq: Once | ORAL | Status: AC | PRN

## 2022-09-25 MED ORDER — ACETAMINOPHEN 650 MG RE SUPP: 650.0000 mg | RECTAL | Status: DC | PRN

## 2022-09-25 MED ORDER — FLEET ENEMA 7-19 GM/118ML RE ENEM: 1.0000 | ENEMA | Freq: Once | RECTAL | Status: DC | PRN

## 2022-09-25 MED ORDER — OXYCODONE HCL 5 MG PO TABS: 5.0000 mg | ORAL_TABLET | ORAL | Status: DC | PRN

## 2022-09-25 MED ORDER — MEPERIDINE HCL 25 MG/ML IJ SOLN: 6.2500 mg | INTRAMUSCULAR | Status: DC | PRN

## 2022-09-25 MED ORDER — THROMBIN 5000 UNITS EX SOLR
OROMUCOSAL | Status: DC | PRN
  Administered 2022-09-25: 5 mL via TOPICAL

## 2022-09-25 MED ORDER — SODIUM CHLORIDE 0.9% FLUSH: 3.0000 mL | Freq: Two times a day (BID) | INTRAVENOUS | Status: DC

## 2022-09-25 MED ORDER — CHLORHEXIDINE GLUCONATE CLOTH 2 % EX PADS: 6.0000 | MEDICATED_PAD | Freq: Once | CUTANEOUS | Status: DC

## 2022-09-25 MED ORDER — LIDOCAINE-EPINEPHRINE 1 %-1:100000 IJ SOLN
INTRAMUSCULAR | Status: DC | PRN
  Administered 2022-09-25: 7 mL
  Administered 2022-09-25: 10 mL

## 2022-09-25 MED ORDER — SODIUM CHLORIDE (PF) 0.9 % IJ SOLN
INTRAMUSCULAR | Status: DC | PRN
  Administered 2022-09-25: 10 mL

## 2022-09-25 MED ORDER — PHENYLEPHRINE HCL-NACL 20-0.9 MG/250ML-% IV SOLN
INTRAVENOUS | Status: DC | PRN
  Administered 2022-09-25: 20 ug/min via INTRAVENOUS

## 2022-09-25 MED ORDER — POTASSIUM CHLORIDE IN NACL 20-0.9 MEQ/L-% IV SOLN: INTRAVENOUS | Status: DC

## 2022-09-25 MED ORDER — ACETAMINOPHEN 500 MG PO TABS
1000.0000 mg | ORAL_TABLET | Freq: Once | ORAL | Status: AC
  Administered 2022-09-25: 1000 mg via ORAL
  Filled 2022-09-25: qty 2

## 2022-09-25 MED ORDER — CEFAZOLIN SODIUM-DEXTROSE 2-4 GM/100ML-% IV SOLN
2.0000 g | Freq: Three times a day (TID) | INTRAVENOUS | Status: AC
  Administered 2022-09-25 – 2022-09-26 (×2): 2 g via INTRAVENOUS
  Filled 2022-09-25 (×2): qty 100

## 2022-09-25 MED ORDER — BUPIVACAINE HCL (PF) 0.5 % IJ SOLN
INTRAMUSCULAR | Status: DC | PRN
  Administered 2022-09-25: 30 mL

## 2022-09-25 MED ORDER — ONDANSETRON HCL 4 MG PO TABS: 4.0000 mg | ORAL_TABLET | Freq: Four times a day (QID) | ORAL | Status: DC | PRN

## 2022-09-25 MED ORDER — BACLOFEN 20 MG PO TABS
20.0000 mg | ORAL_TABLET | Freq: Three times a day (TID) | ORAL | Status: DC
  Administered 2022-09-25: 20 mg via ORAL
  Filled 2022-09-25 (×4): qty 1

## 2022-09-25 MED ORDER — THROMBIN 5000 UNITS EX SOLR
CUTANEOUS | Status: AC
  Filled 2022-09-25: qty 5000

## 2022-09-25 MED ORDER — DOCUSATE SODIUM 100 MG PO CAPS
100.0000 mg | ORAL_CAPSULE | Freq: Two times a day (BID) | ORAL | Status: DC
  Administered 2022-09-25: 100 mg via ORAL
  Filled 2022-09-25 (×2): qty 1

## 2022-09-25 MED ORDER — PHENOL 1.4 % MT LIQD: 1.0000 | OROMUCOSAL | Status: DC | PRN

## 2022-09-25 MED ORDER — BUPIVACAINE HCL (PF) 0.5 % IJ SOLN
INTRAMUSCULAR | Status: AC
  Filled 2022-09-25: qty 30

## 2022-09-25 MED ORDER — PANTOPRAZOLE SODIUM 40 MG PO TBEC
40.0000 mg | DELAYED_RELEASE_TABLET | Freq: Every day | ORAL | Status: DC
  Filled 2022-09-25: qty 1

## 2022-09-25 MED ORDER — LACTATED RINGERS IV SOLN: INTRAVENOUS | Status: DC | PRN

## 2022-09-25 MED ORDER — ORAL CARE MOUTH RINSE: 15.0000 mL | Freq: Once | OROMUCOSAL | Status: AC

## 2022-09-25 MED ORDER — BUPIVACAINE LIPOSOME 1.3 % IJ SUSP
INTRAMUSCULAR | Status: AC
  Filled 2022-09-25: qty 20

## 2022-09-25 MED ORDER — OXYCODONE HCL 5 MG PO TABS
5.0000 mg | ORAL_TABLET | Freq: Once | ORAL | Status: AC | PRN
  Administered 2022-09-25: 5 mg via ORAL

## 2022-09-25 MED ORDER — LACTATED RINGERS IV SOLN: INTRAVENOUS | Status: DC

## 2022-09-25 MED ORDER — PHENYLEPHRINE HCL (PRESSORS) 10 MG/ML IV SOLN
INTRAVENOUS | Status: DC | PRN
  Administered 2022-09-25: 80 ug via INTRAVENOUS

## 2022-09-25 MED ORDER — MIDAZOLAM HCL 2 MG/2ML IJ SOLN: 0.5000 mg | Freq: Once | INTRAMUSCULAR | Status: DC | PRN

## 2022-09-25 MED ORDER — HYDROMORPHONE HCL 1 MG/ML IJ SOLN: 1.0000 mg | INTRAMUSCULAR | Status: DC | PRN

## 2022-09-25 MED ORDER — TRAZODONE HCL 50 MG PO TABS: 50.0000 mg | ORAL_TABLET | Freq: Every evening | ORAL | Status: DC | PRN

## 2022-09-25 MED ORDER — OXYCODONE HCL 5 MG PO TABS
ORAL_TABLET | ORAL | Status: AC
  Filled 2022-09-25: qty 1

## 2022-09-25 MED ORDER — HYDROMORPHONE HCL 1 MG/ML IJ SOLN
INTRAMUSCULAR | Status: DC | PRN
  Administered 2022-09-25: .5 mg via INTRAVENOUS

## 2022-09-25 MED ORDER — OXYCODONE HCL 5 MG PO TABS
10.0000 mg | ORAL_TABLET | ORAL | Status: DC | PRN
  Administered 2022-09-26 (×4): 10 mg via ORAL
  Filled 2022-09-25 (×4): qty 2

## 2022-09-25 MED ORDER — METHOCARBAMOL 750 MG PO TABS
750.0000 mg | ORAL_TABLET | Freq: Three times a day (TID) | ORAL | Status: DC | PRN
  Administered 2022-09-25 – 2022-09-26 (×2): 750 mg via ORAL
  Filled 2022-09-25 (×2): qty 1

## 2022-09-25 MED ORDER — HYDROMORPHONE HCL 1 MG/ML IJ SOLN
INTRAMUSCULAR | Status: AC
  Filled 2022-09-25: qty 1

## 2022-09-25 MED ORDER — FENTANYL CITRATE (PF) 250 MCG/5ML IJ SOLN
INTRAMUSCULAR | Status: DC | PRN
  Administered 2022-09-25: 50 ug via INTRAVENOUS
  Administered 2022-09-25: 100 ug via INTRAVENOUS
  Administered 2022-09-25 (×2): 50 ug via INTRAVENOUS

## 2022-09-25 MED ORDER — PROPOFOL 10 MG/ML IV BOLUS
INTRAVENOUS | Status: DC | PRN
  Administered 2022-09-25: 130 mg via INTRAVENOUS

## 2022-09-25 MED ORDER — LORATADINE 10 MG PO TABS
10.0000 mg | ORAL_TABLET | Freq: Every day | ORAL | Status: DC
  Filled 2022-09-25: qty 1

## 2022-09-25 MED ORDER — HYDROMORPHONE HCL 1 MG/ML IJ SOLN
0.2500 mg | INTRAMUSCULAR | Status: DC | PRN
  Administered 2022-09-25 (×3): 0.5 mg via INTRAVENOUS

## 2022-09-25 MED ORDER — LIDOCAINE HCL (CARDIAC) PF 100 MG/5ML IV SOSY
PREFILLED_SYRINGE | INTRAVENOUS | Status: DC | PRN
  Administered 2022-09-25: 60 mg via INTRATRACHEAL

## 2022-09-25 MED ORDER — POLYETHYLENE GLYCOL 3350 17 G PO PACK: 17.0000 g | PACK | Freq: Every day | ORAL | Status: DC | PRN

## 2022-09-25 MED ORDER — CHLORHEXIDINE GLUCONATE 0.12 % MT SOLN
15.0000 mL | Freq: Once | OROMUCOSAL | Status: AC
  Administered 2022-09-25: 15 mL via OROMUCOSAL
  Filled 2022-09-25: qty 15

## 2022-09-25 MED ORDER — CEFAZOLIN SODIUM-DEXTROSE 2-4 GM/100ML-% IV SOLN
2.0000 g | INTRAVENOUS | Status: DC
  Filled 2022-09-25: qty 100

## 2022-09-25 MED ORDER — ACETAMINOPHEN 325 MG PO TABS
650.0000 mg | ORAL_TABLET | ORAL | Status: DC | PRN
  Administered 2022-09-25: 650 mg via ORAL
  Filled 2022-09-25 (×2): qty 2

## 2022-09-25 MED ORDER — ROCURONIUM BROMIDE 100 MG/10ML IV SOLN
INTRAVENOUS | Status: DC | PRN
  Administered 2022-09-25: 70 mg via INTRAVENOUS
  Administered 2022-09-25 (×2): 30 mg via INTRAVENOUS

## 2022-09-25 MED ORDER — PROMETHAZINE HCL 25 MG/ML IJ SOLN: 6.2500 mg | INTRAMUSCULAR | Status: DC | PRN

## 2022-09-25 MED ORDER — HYDROMORPHONE HCL 1 MG/ML IJ SOLN
INTRAMUSCULAR | Status: AC
  Filled 2022-09-25: qty 0.5

## 2022-09-25 SURGICAL SUPPLY — 91 items
ADH SKN CLS APL DERMABOND .7 (GAUZE/BANDAGES/DRESSINGS) ×2
ADH SKN CLS LQ APL DERMABOND (GAUZE/BANDAGES/DRESSINGS) ×1
BAG COUNTER SPONGE SURGICOUNT (BAG) ×2 IMPLANT
BAG SPNG CNTER NS LX DISP (BAG) ×1
BAND INSRT 18 STRL LF DISP RB (MISCELLANEOUS)
BAND RUBBER #18 3X1/16 STRL (MISCELLANEOUS) IMPLANT
BASKET BONE COLLECTION (BASKET) IMPLANT
BLADE CLIPPER SURG (BLADE) IMPLANT
BLADE SURG 11 STRL SS (BLADE) ×2 IMPLANT
BUR 14 MATCH 3 (BUR) IMPLANT
BUR CARBIDE MATCH 3.0 (BURR) IMPLANT
BUR MATCHSTICK NEURO 3.0 LAGG (BURR) IMPLANT
BUR MR8 14 BALL 5 (BUR) IMPLANT
BUR SURG 4X12.5 HEAD ROUND (BURR) IMPLANT
BUR SURG IBUR 4X12.5 (BURR) ×2 IMPLANT
BURR 14 MATCH 3 (BUR)
BURR MR8 14 BALL 5 (BUR)
BURR SURG 4X12.5 HEAD ROUND (BURR) ×1
BURR SURG IBUR 4X12.5 (BURR) ×1
CAGE EXP CATALYFT 9 (Plate) IMPLANT
CANISTER SUCT 3000ML PPV (MISCELLANEOUS) ×2 IMPLANT
CNTNR URN SCR LID CUP LEK RST (MISCELLANEOUS) ×2 IMPLANT
CONT SPEC 4OZ STRL OR WHT (MISCELLANEOUS) ×1
COVER BACK TABLE 60X90IN (DRAPES) ×2 IMPLANT
COVERAGE SUPPORT O-ARM STEALTH (MISCELLANEOUS) ×1 IMPLANT
DERMABOND ADVANCED .7 DNX12 (GAUZE/BANDAGES/DRESSINGS) ×4 IMPLANT
DERMABOND ADVANCED .7 DNX6 (GAUZE/BANDAGES/DRESSINGS) IMPLANT
DRAIN JACKSON PRATT 10MM FLAT (MISCELLANEOUS) IMPLANT
DRAPE 3/4 80X56 (DRAPES) ×2 IMPLANT
DRAPE C-ARM 42X72 X-RAY (DRAPES) ×2 IMPLANT
DRAPE C-ARMOR (DRAPES) ×2 IMPLANT
DRAPE LAPAROTOMY 100X72X124 (DRAPES) ×2 IMPLANT
DRAPE MICROSCOPE SLANT 54X150 (MISCELLANEOUS) ×2 IMPLANT
DRAPE SHEET LG 3/4 BI-LAMINATE (DRAPES) ×8 IMPLANT
DRSG OPSITE POSTOP 4X6 (GAUZE/BANDAGES/DRESSINGS) IMPLANT
DURAPREP 26ML APPLICATOR (WOUND CARE) ×2 IMPLANT
ELECT BLADE INSULATED 6.5IN (ELECTROSURGICAL) ×1
ELECT COATED BLADE 2.86 ST (ELECTRODE) ×2 IMPLANT
ELECT REM PT RETURN 9FT ADLT (ELECTROSURGICAL) ×1
ELECTRODE BLDE INSULATED 6.5IN (ELECTROSURGICAL) ×2 IMPLANT
ELECTRODE REM PT RTRN 9FT ADLT (ELECTROSURGICAL) ×2 IMPLANT
EVACUATOR SILICONE 100CC (DRAIN) IMPLANT
EXTENDER TAB GUIDE SV 5.5/6.0 (INSTRUMENTS) IMPLANT
FEE COVERAGE SUPPORT O-ARM (MISCELLANEOUS) ×2 IMPLANT
GAUZE 4X4 16PLY ~~LOC~~+RFID DBL (SPONGE) IMPLANT
GAUZE SPONGE 4X4 12PLY STRL (GAUZE/BANDAGES/DRESSINGS) IMPLANT
GLOVE BIOGEL PI IND STRL 7.5 (GLOVE) ×2 IMPLANT
GLOVE ECLIPSE 7.5 STRL STRAW (GLOVE) ×2 IMPLANT
GLOVE EXAM NITRILE XL STR (GLOVE) IMPLANT
GLOVE SURG ENC MOIS LTX SZ8 (GLOVE) ×2 IMPLANT
GLOVE SURG UNDER POLY LF SZ8.5 (GLOVE) ×2 IMPLANT
GOWN STRL REUS W/ TWL LRG LVL3 (GOWN DISPOSABLE) ×2 IMPLANT
GOWN STRL REUS W/ TWL XL LVL3 (GOWN DISPOSABLE) ×4 IMPLANT
GOWN STRL REUS W/TWL 2XL LVL3 (GOWN DISPOSABLE) IMPLANT
GOWN STRL REUS W/TWL LRG LVL3 (GOWN DISPOSABLE) ×1
GOWN STRL REUS W/TWL XL LVL3 (GOWN DISPOSABLE) ×2
HEMOSTAT POWDER KIT SURGIFOAM (HEMOSTASIS) ×2 IMPLANT
KIT BASIN OR (CUSTOM PROCEDURE TRAY) ×2 IMPLANT
KIT INFUSE XX SMALL 0.7CC (Orthopedic Implant) IMPLANT
KIT TURNOVER KIT B (KITS) ×2 IMPLANT
MARKER SPHERE PSV REFLC NDI (MISCELLANEOUS) ×10 IMPLANT
MILL BONE PREP (MISCELLANEOUS) ×2 IMPLANT
NDL HYPO 18GX1.5 BLUNT FILL (NEEDLE) IMPLANT
NDL HYPO 21X1.5 SAFETY (NEEDLE) IMPLANT
NDL SPNL 18GX3.5 QUINCKE PK (NEEDLE) IMPLANT
NEEDLE HYPO 18GX1.5 BLUNT FILL (NEEDLE) IMPLANT
NEEDLE HYPO 21X1.5 SAFETY (NEEDLE) IMPLANT
NEEDLE HYPO 22GX1.5 SAFETY (NEEDLE) ×2 IMPLANT
NEEDLE SPNL 18GX3.5 QUINCKE PK (NEEDLE) IMPLANT
NS IRRIG 1000ML POUR BTL (IV SOLUTION) ×2 IMPLANT
PACK LAMINECTOMY NEURO (CUSTOM PROCEDURE TRAY) ×2 IMPLANT
PAD ARMBOARD 7.5X6 YLW CONV (MISCELLANEOUS) ×6 IMPLANT
PIN BONE FIX 100 (PIN) IMPLANT
PUTTY GRAFTON DBF 6CC W/DELIVE (Putty) IMPLANT
ROD 5.5X45MM SOLERA VOYAGER (Rod) IMPLANT
SCREW 5.5 VOYAGER MAS 6.5X50 (Screw) IMPLANT
SCREW MAS VOYAGER 6.5X55 (Screw) IMPLANT
SCREW SET 5.5/6.0MM SOLERA (Screw) IMPLANT
SCREW SPINAL IFIX 6.5X45 (Screw) IMPLANT
SPIKE FLUID TRANSFER (MISCELLANEOUS) ×2 IMPLANT
SPONGE SURGIFOAM ABS GEL 100 (HEMOSTASIS) IMPLANT
SPONGE T-LAP 4X18 ~~LOC~~+RFID (SPONGE) IMPLANT
SUT MNCRL AB 4-0 PS2 18 (SUTURE) ×2 IMPLANT
SUT VIC AB 0 CT1 18XCR BRD8 (SUTURE) ×2 IMPLANT
SUT VIC AB 0 CT1 8-18 (SUTURE) ×1
SUT VIC AB 2-0 CP2 18 (SUTURE) ×2 IMPLANT
SYR 30ML LL (SYRINGE) ×2 IMPLANT
TOWEL GREEN STERILE (TOWEL DISPOSABLE) ×2 IMPLANT
TOWEL GREEN STERILE FF (TOWEL DISPOSABLE) ×2 IMPLANT
TRAY FOLEY MTR SLVR 16FR STAT (SET/KITS/TRAYS/PACK) ×2 IMPLANT
WATER STERILE IRR 1000ML POUR (IV SOLUTION) ×2 IMPLANT

## 2022-09-25 NOTE — Progress Notes (Signed)
Orthopedic Tech Progress Note Patient Details:  Francis [name] Dec 10, 1970 [id number]  Ortho Devices Type of Ortho Device: Lumbar corsett Ortho Device/Splint Interventions: Ordered   Post Interventions Instructions Provided: Adjustment of device, Care of device  [name] 09/25/2022, 9:11 PM

## 2022-09-25 NOTE — Transfer of Care (Signed)
Immediate Anesthesia Transfer of Care Note  Patient: Francis [name]  Procedure(s) Performed: MINIMALLY INVASIVE TRANSFORAMINAL LUMBAR INTERBODY FUSION, RIGHT  LUMBAR FOUR-FIVE (Right) Application of O-Arm (Right)  Patient Location: PACU  Anesthesia Type:General  Level of Consciousness: drowsy  Airway & Oxygen Therapy: Patient Spontanous Breathing and Patient connected to nasal cannula oxygen  Post-op Assessment: Report given to RN and Post -op Vital signs reviewed and stable  Post vital signs: Reviewed  Last Vitals:  Vitals Value Taken Time  BP    Temp    Pulse 96 09/25/22 1959  Resp 18 09/25/22 1959  SpO2 96 % 09/25/22 1959  Vitals shown include unvalidated device data.  Last Pain:  Vitals:   09/25/22 1252  TempSrc:   PainSc: 5       Patients Stated Pain Goal: 3 (11/06/01 4961)  Complications: No notable events documented.

## 2022-09-25 NOTE — Anesthesia Procedure Notes (Signed)
Procedure Name: Intubation Date/Time: 09/25/2022 4:32 PM  Performed by: Georgia Duff, CRNAPre-anesthesia Checklist: Patient identified, Emergency Drugs available, Suction available and Patient being monitored Patient Re-evaluated:Patient Re-evaluated prior to induction Oxygen Delivery Method: Circle System Utilized Preoxygenation: Pre-oxygenation with 100% oxygen Induction Type: IV induction Ventilation: Mask ventilation without difficulty Laryngoscope Size: Glidescope and 4 Grade View: Grade I Tube type: Oral Tube size: 7.5 mm Number of attempts: 1 Airway Equipment and Method: Stylet and Oral airway Placement Confirmation: ETT inserted through vocal cords under direct vision, positive ETCO2 and breath sounds checked- equal and bilateral Secured at: 21 cm Tube secured with: Tape Dental Injury: Teeth and Oropharynx as per pre-operative assessment
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
## 2022-09-25 NOTE — Op Note (Signed)
PREOP DIAGNOSIS: lumbar radiculopathy  POSTOP DIAGNOSIS: lumbar radiculopathy  PROCEDURE: 1. L4-5 lumbar interbody fusion via right transforaminal approach with tubular retractors 2. Posterolateral arthrodesis, L4-5 3. Placement of interbody cage L4-5 4. Nonsegmental instrumentation with percutaneously placed pedicle screw and rod construct at L4-5 5. Harvest of local autograft 6. Use of morselized allograft 7. Use of microscope for microdissection 8. Intraoperative neuronavigation with Stealth 9. Intraoperative CT scan  SURGEON: Dr. Duffy Rhody, MD  ASSISTANT:  None   ANESTHESIA: General Endotracheal  EBL: 75 ml  IMPLANTS:  Medtronic 6.5 x 55/50 mm screws at L4, 6.5 x 45 mm screws at L5 9 mm Long PL Catalyft cage 45 mm rod x 2 Xxs BMP, DBM  SPECIMENS: None  DRAINS: None  COMPLICATIONS: none  CONDITION: Stable to PACU  HISTORY: Francis [name] is a 51 y.o. male who had a right far lateral MLD and did well initially, but developed recurrent radiculopathy.   MRI showed recurrent disc herniation, progressive disc degeneration with acute Modic changes, foraminal height loss and spondylolisthesis. Treatment options were discussed and the patient elected to proceed with MIS TLIF at L4-5. Risks, benefits, alternatives, and expected convalescence were discussed with the patient.  Risks discussed included but were not limited to bleeding, pain, infection, scar, pseudoarthrosis, CSF leak, neurologic deficit, paralysis, and death.  The patient wished to proceed with surgery and informed consent was obtained.  PROCEDURE IN DETAIL: After informed consent was obtained and witnessed, the patient was brought to the operating room. After induction of general anesthesia, the patient was positioned on the operative table in the prone position on a Jackson table with all pressure points meticulously padded. The skin of the low back was then prepped and draped in the usual sterile  fashion.  A PSIS iliac spine was placed, and intraoperative CT obtained to allow for neuronavigation.  Using the Stealth navigation, paramedian incisions were planned for pedicle screw trajectories.  Paramedian incisions were made with a 10 blade.  Navigated high speed drill was used to cannulate the pedicles, and pedicles were then tapped using navigation.  Ball ended feeler confirmed good cannulation with no breaches.  On the side contralateral to the TLIF, appropriately sized pedicle screws were placed using navigation.   Navigated initial dilator was then docked on the L4-5 facet  on the right side.  Sequential dilators and final tubular retractor was placed and locked in position, with navigation confirming good placement.  The microscope was then introduced in the field to allow for intraoperative microdissection.  Remaining muscle was moved off of the degenerated facet joint.  The inferior facets was removed using an osteotome and harvested for autograft.  Overgrown superior facets was then removed with rongeurs.  The traversing nerve root was identified and good decompression was performed with removal of overgrown ligament and facets with rongeurs.  There was a fair amount of scar in the foramen as well as disc herniation from which the exiting nerve root had to be dissected. Epidural veins in the foramen were coagulated and cut with allowed access to the disc space lateral to the traversing nerve root.  The disc was opened with 15 blade and pituitary rongeur was used to remove disc material.  Disc shavers of increasing size was then used to clean the disc space as well as to restore disc space height.  The interbody space was prepared with rasps and curettes with removal of the cartilage from the endplates.  Appropriate sized interbody spacer was then sized.  The inner body space was packed with morselized allograft mixed with autograft as well as extra extra small BMP.  An expandable size 9 mm interbody  spacer was then tamped into place with a mallet under x-ray guidance.  It was then expanded until snug with the endplates.  The wound was then irrigated thoroughly with bacitracin impregnated irrigation and meticulous hemostasis was obtained.  The retractor was then removed and meticulous hemostasis was obtained in the muscle layer and subcutaneous layer.  Remaining pedicle screws were then placed using navigation.  Good purchase was noted.   Rods were then passed through the screw towers bilaterally and secured with screw caps.  AP and lateral X-ray confirmed good placement of the implants as well as good reduction of spondylolisthesis and restoration of disc space and foraminal height.  The screw caps were final tightened and the towers removed.  Navigated drill was used to decorticate the facet joint contralateral to the TLIF, and bone graft was placed in the facet and lateral gutter.  Wounds were irrigated thoroughly .  Exparel mixed with Marcaine was injected into the paraspinous muscles and subcutaneous tissues bilaterally.  The fascia was closed with 0 Vicryl stitches.  The dermal layer was closed with 2-0 Vicryl stitches in buried interrupted fashion.  The skin incisions were closed with 4-0 Monocryl subcuticular manner followed by Dermabond.  The PSIS pin was removed and small incision closed with 3-0 vicryl in buried fashion and Dermabond.  Sterile dressings were placed.  Patient was then flipped supine and extubated by the anesthesia service following commands and all 4 extremities.  All counts were correct at the end of surgery.  No complications were noted.

## 2022-09-25 NOTE — Anesthesia Postprocedure Evaluation (Signed)
Anesthesia Post Note  Patient: Francis [name]  Procedure(s) Performed: MINIMALLY INVASIVE TRANSFORAMINAL LUMBAR INTERBODY FUSION, RIGHT  LUMBAR FOUR-FIVE (Right) Application of O-Arm (Right)     Patient location during evaluation: PACU Anesthesia Type: General Level of consciousness: awake Pain management: pain level controlled Vital Signs Assessment: post-procedure vital signs reviewed and stable Respiratory status: spontaneous breathing, nonlabored ventilation and respiratory function stable Cardiovascular status: blood pressure returned to baseline and stable Postop Assessment: no apparent nausea or vomiting Anesthetic complications: no   No notable events documented.  Last Vitals:  Vitals:   09/25/22 2107 09/25/22 2300  BP: 132/87 104/72  Pulse: 82 80  Resp: 18 18  Temp: 36.7 C 37.4 C  SpO2: 98% 96%    Last Pain:  Vitals:   09/25/22 2300  TempSrc: Oral  PainSc:                  [name] P Regina Ganci

## 2022-09-26 ENCOUNTER — Encounter (HOSPITAL_COMMUNITY): Payer: Self-pay | Admitting: Neurosurgery

## 2022-09-26 DIAGNOSIS — M5416 Radiculopathy, lumbar region: Secondary | ICD-10-CM | POA: Diagnosis not present

## 2022-09-26 MED ORDER — DOCUSATE SODIUM 100 MG PO CAPS: 100.0000 mg | ORAL_CAPSULE | Freq: Two times a day (BID) | ORAL | 2 refills | Status: AC

## 2022-09-26 MED ORDER — OXYCODONE-ACETAMINOPHEN 5-325 MG PO TABS: 1.0000 | ORAL_TABLET | Freq: Four times a day (QID) | ORAL | 0 refills | Status: DC | PRN

## 2022-09-26 NOTE — Evaluation (Signed)
Physical Therapy Evaluation Patient Details Name: Francis [name] MRN: [id number] DOB: 1971/07/18 Today's Date: 09/26/2022  History of Present Illness  Pt is a 51 y.o. male s/p R TLIF. PMH: GERD, bipolar 1, hepatitis C, anxiety, deppression  Clinical Impression  PT eval complete. Pt required supervision transfers and ambulation 200' with RW. Flexed posture requiring verbal cues to correct. Min guard assist ascend/descend 3 steps with L rail. All education complete. Plan for d/c home today.        Recommendations for follow up therapy are one component of a multi-disciplinary discharge planning process, led by the attending physician.  Recommendations may be updated based on patient status, additional functional criteria and insurance authorization.  Follow Up Recommendations Follow physician's recommendations for discharge plan and follow up therapies      Assistance Recommended at Discharge PRN  Patient can return home with the following       Equipment Recommendations None recommended by PT  Recommendations for Other Services       Functional Status Assessment Patient has had a recent decline in their functional status and demonstrates the ability to make significant improvements in function in a reasonable and predictable amount of time.     Precautions / Restrictions Precautions Precautions: Back Precaution Comments: Educated on 3/3 back precautions. Handout provided. Required Braces or Orthoses: Spinal Brace Spinal Brace: Lumbar corset;Applied in sitting position      Mobility  Bed Mobility Overal bed mobility: Modified Independent             General bed mobility comments: increased time, HOB elevated    Transfers Overall transfer level: Needs assistance Equipment used: Ambulation equipment used Transfers: Sit to/from Stand Sit to Stand: Supervision           General transfer comment: supervision for safety    Ambulation/Gait Ambulation/Gait  assistance: Supervision Gait Distance (Feet): 200 Feet Assistive device: Rolling walker (2 wheels) Gait Pattern/deviations: Step-through pattern, Decreased stride length, Trunk flexed Gait velocity: decreased Gait velocity interpretation: <1.31 ft/sec, indicative of household ambulator   General Gait Details: cues for posture  Stairs Stairs: Yes Stairs assistance: Min guard Stair Management: One rail Left, Sideways Number of Stairs: 3    Wheelchair Mobility    Modified Rankin (Stroke Patients Only)       Balance Overall balance assessment: Mild deficits observed, not formally tested                                           Pertinent Vitals/Pain Pain Assessment Pain Assessment: Faces Faces Pain Scale: Hurts whole lot Pain Location: back Pain Descriptors / Indicators: Discomfort, Operative site guarding Pain Intervention(s): Limited activity within patient's tolerance, Monitored during session, Repositioned    Home Living Family/patient expects to be discharged to:: Private residence Living Arrangements: Non-relatives/Friends Available Help at Discharge: Friend(s);Available 24 hours/day Type of Home: House Home Access: Stairs to enter Entrance Stairs-Rails: Psychiatric nurse of Steps: 3   Home Layout: Able to live on main level with bedroom/bathroom Home Equipment: Conservation officer, nature (2 wheels);Cane - single point      Prior Function Prior Level of Function : Independent/Modified Independent                     Hand Dominance        Extremity/Trunk Assessment   Upper Extremity Assessment Upper Extremity Assessment: Defer to OT evaluation  Lower Extremity Assessment Lower Extremity Assessment: Generalized weakness    Cervical / Trunk Assessment Cervical / Trunk Assessment: Back Surgery  Communication   Communication: No difficulties  Cognition Arousal/Alertness: Awake/alert Behavior During Therapy: WFL for  tasks assessed/performed Overall Cognitive Status: Within Functional Limits for tasks assessed                                          General Comments      Exercises     Assessment/Plan    PT Assessment Patient does not need any further PT services  PT Problem List         PT Treatment Interventions      PT Goals (Current goals can be found in the Care Plan section)  Acute Rehab PT Goals Patient Stated Goal: home today PT Goal Formulation: All assessment and education complete, DC therapy    Frequency       Co-evaluation               AM-PAC PT "6 Clicks" Mobility  Outcome Measure Help needed turning from your back to your side while in a flat bed without using bedrails?: None Help needed moving from lying on your back to sitting on the side of a flat bed without using bedrails?: None Help needed moving to and from a bed to a chair (including a wheelchair)?: A Little Help needed standing up from a chair using your arms (e.g., wheelchair or bedside chair)?: A Little Help needed to walk in hospital room?: A Little Help needed climbing 3-5 steps with a railing? : A Little 6 Click Score: 20    End of Session Equipment Utilized During Treatment: Gait belt;Back brace Activity Tolerance: Patient tolerated treatment well Patient left: in chair;with call bell/phone within reach Nurse Communication: Mobility status PT Visit Diagnosis: Difficulty in walking, not elsewhere classified (R26.2)    Time: 6803-2122 PT Time Calculation (min) (ACUTE ONLY): 19 min   Charges:   PT Evaluation $PT Eval Moderate Complexity: 1 Mod          Lorrin Goodell, PT  Office # [phone] Pager [phone]   Lorriane Shire 09/26/2022, 8:38 AM

## 2022-09-26 NOTE — Plan of Care (Signed)
Patient alert and oriented, void, VSS, surgical site clean and dry no sign of infection. D/c instructions explain and given to the patient. D/c patient home per order. Problem: Education: Goal: Ability to verbalize activity precautions or restrictions will improve Outcome: Completed/Met Goal: Knowledge of the prescribed therapeutic regimen will improve Outcome: Completed/Met Goal: Understanding of discharge needs will improve Outcome: Completed/Met

## 2022-09-26 NOTE — Evaluation (Signed)
Occupational Therapy Evaluation and Discharge Summary Patient Details Name: Francis [name] MRN: [id number] DOB: 1971/09/15 Today's Date: 09/26/2022   History of Present Illness Pt is a 51 y.o. male s/p R TLIF. PMH: GERD, bipolar 1, hepatitis C, anxiety, deppression   Clinical Impression   Pt admitted with the above diagnosis and overall is at baseline with his adls despite being in pain.  Pt has hard time following his back precautions so they were reviewed at length.  Pt will have his partner at home to assist with adls as needed. Reviewed need to wear brace and all adl techniques to protect pt's back. Pt safe to dc home with no follow up OT.      Recommendations for follow up therapy are one component of a multi-disciplinary discharge planning process, led by the attending physician.  Recommendations may be updated based on patient status, additional functional criteria and insurance authorization.   Follow Up Recommendations  No OT follow up     Assistance Recommended at Discharge PRN  Patient can return home with the following A little help with bathing/dressing/bathroom;Assistance with cooking/housework;Assist for transportation;Help with stairs or ramp for entrance    Functional Status Assessment  Patient has not had a recent decline in their functional status  Equipment Recommendations  None recommended by OT    Recommendations for Other Services       Precautions / Restrictions Precautions Precautions: Back Precaution Comments: Educated on 3/3 back precautions. Handout provided. Pt recalled 1/3 precautions from when PT educated pt 30 min ago. Required Braces or Orthoses: Spinal Brace Spinal Brace: Lumbar corset;Applied in sitting position Restrictions Weight Bearing Restrictions: No      Mobility Bed Mobility Overal bed mobility: Modified Independent             General bed mobility comments: increased time, HOB elevated    Transfers Overall transfer  level: Needs assistance Equipment used: Ambulation equipment used Transfers: Sit to/from Stand Sit to Stand: Supervision           General transfer comment: supervision for safety      Balance Overall balance assessment: Mild deficits observed, not formally tested                                         ADL either performed or assessed with clinical judgement   ADL Overall ADL's : At baseline                                       General ADL Comments: Pt can perform most adls. Reviewed how to donn and doff brace and that brace needed to be worn when OOB with exception of bathing and going to bathroom at night.  Partner there to assist with adls if needed and pt did not appear to interested in adl techinques to protect his spine although they all were addressed.     Vision Baseline Vision/History: 0 No visual deficits Ability to See in Adequate Light: 0 Adequate Patient Visual Report: No change from baseline Vision Assessment?: No apparent visual deficits     Perception Perception Perception Tested?: No   Praxis Praxis Praxis tested?: Within functional limits    Pertinent Vitals/Pain Pain Assessment Pain Assessment: Faces Faces Pain Scale: Hurts whole lot Pain Location: back Pain Descriptors / Indicators: Discomfort, Operative  site guarding Pain Intervention(s): Monitored during session, Repositioned, Patient requesting pain meds-RN notified     Hand Dominance Right   Extremity/Trunk Assessment Upper Extremity Assessment Upper Extremity Assessment: Overall WFL for tasks assessed   Lower Extremity Assessment Lower Extremity Assessment: Defer to PT evaluation   Cervical / Trunk Assessment Cervical / Trunk Assessment: Back Surgery   Communication Communication Communication: No difficulties   Cognition Arousal/Alertness: Awake/alert Behavior During Therapy: WFL for tasks assessed/performed Overall Cognitive Status: Within  Functional Limits for tasks assessed                                 General Comments: Pt likes to do things how he wants to do things.  Tried to review back precautions in depth due to above given pt is in for second back surgery.     General Comments  Pt in pain and wanting to go home more than wanting therapy.    Exercises     Shoulder Instructions      Home Living Family/patient expects to be discharged to:: Private residence Living Arrangements: Non-relatives/Friends Available Help at Discharge: Friend(s);Available 24 hours/day Type of Home: House Home Access: Stairs to enter [employer] of Steps: 3 Entrance Stairs-Rails: Right;Left Home Layout: Able to live on main level with bedroom/bathroom     Bathroom Shower/Tub: Teacher, early years/pre: Standard     Home Equipment: Conservation officer, nature (2 wheels);Cane - single point          Prior Functioning/Environment Prior Level of Function : Independent/Modified Independent                        OT Problem List:        OT Treatment/Interventions:      OT Goals(Current goals can be found in the care plan section) Acute Rehab OT Goals Patient Stated Goal: to go home OT Goal Formulation: With patient Time For Goal Achievement: 10/10/22 Potential to Achieve Goals: Good  OT Frequency:      Co-evaluation              AM-PAC OT "6 Clicks" Daily Activity     Outcome Measure Help from another person eating meals?: None Help from another person taking care of personal grooming?: None Help from another person toileting, which includes using toliet, bedpan, or urinal?: None Help from another person bathing (including washing, rinsing, drying)?: A Little Help from another person to put on and taking off regular upper body clothing?: A Little Help from another person to put on and taking off regular lower body clothing?: A Lot 6 Click Score: 20   End of Session Equipment  Utilized During Treatment: Rolling walker (2 wheels);Back brace Nurse Communication: Mobility status  Activity Tolerance: Patient tolerated treatment well Patient left: in chair;with call bell/phone within reach;with family/visitor present  OT Visit Diagnosis: Unsteadiness on feet (R26.81)                Time: 5035-4656 OT Time Calculation (min): 12 min Charges:  OT General Charges $OT Visit: 1 Visit OT Evaluation $OT Eval Low Complexity: 1 Low  Glenford Peers 09/26/2022, 10:53 AM

## 2022-09-26 NOTE — Care Management Obs Status (Signed)
[place] NOTIFICATION   Patient Details  Name: Francis [name] MRN: [id number] Date of Birth: 1971/10/25   Medicare Observation Status Notification Given:  Yes    Ella Bodo, RN 09/26/2022, 9:49 AM

## 2022-09-26 NOTE — Discharge Summary (Signed)
  Physician Discharge Summary  Patient ID: Francis [name] MRN: [id number] DOB/AGE: [birth year] 51 y.o.  Admit date: 09/25/2022 Discharge date: 09/26/2022  Admission Diagnoses:  Lumbar spondylolisthesis with radiculopathy  Discharge Diagnoses:  Same Principal Problem:   Spondylolisthesis of lumbar region   Discharged Condition: Stable  Hospital Course:  Francis [name] is a 51 y.o. male who previously underwent right L4-5 far lateral microdiscectomy but later developed recurrent back and radicular pain.  He had worsening disc degeneration with slightly worse spondylolisthesis and recurrent nerve root impingement.  He underwent elective L4-5 MIS TLIF.  Postoperatively, he was admitted to the spine care unit and was mobilized with help of PT and OT.  His pain was well-controlled with p.o. pain medications.  He was tolerating a regular diet.  He was given an LSO brace to wear when out of bed.  His preoperative right foot weakness appeared to have normalized by postop day #1.  He was deemed ready for discharge home on 1130.  Treatments: Surgery -right L4-5 MIS TLIF  Discharge Exam: Blood pressure 117/85, pulse 77, temperature 98.4 F (36.9 C), temperature source Oral, resp. rate 18, height '5\' 9"'$  (1.753 m), weight 72.6 kg, SpO2 98 %. Awake, alert, oriented Speech fluent, appropriate CN grossly intact 5/5 BUE/BLE Wound c/d/i  Disposition: Discharge disposition: 01-Home or Self Care       Discharge Instructions     Incentive spirometry RT   Complete by: As directed       Allergies as of 09/26/2022   No Known Allergies      Medication List     TAKE these medications    ALPRAZolam 0.5 MG tablet Commonly known as: XANAX TAKE 1 TABLET(0.5 MG) BY MOUTH AT BEDTIME AS NEEDED FOR ANXIETY   baclofen 20 MG tablet Commonly known as: LIORESAL TAKE 1 TABLET(20 MG) BY MOUTH THREE TIMES DAILY   cetirizine 10 MG tablet Commonly known as: ZYRTEC TAKE 1 TABLET(10 MG) BY MOUTH  DAILY   docusate sodium 100 MG capsule Commonly known as: Colace Take 1 capsule (100 mg total) by mouth 2 (two) times daily.   methocarbamol 750 MG tablet Commonly known as: ROBAXIN Take 750 mg by mouth in the morning and at bedtime.   metoprolol succinate 25 MG 24 hr tablet Commonly known as: TOPROL-XL TAKE 1/2 TABLET BY MOUTH EVERY DAY FOR BLOOD PRESSURE AND HEART RATE   omeprazole 40 MG capsule Commonly known as: PRILOSEC TAKE 1 CAPSULE BY MOUTH EVERY DAY AS NEEDED FOR ACID REFLUX   oxyCODONE-acetaminophen 5-325 MG tablet Commonly known as: PERCOCET/ROXICET Take 1-2 tablets by mouth every 6 (six) hours as needed for severe pain.   pregabalin 100 MG capsule Commonly known as: LYRICA Take 100 mg by mouth 2 (two) times daily.   traZODone 50 MG tablet Commonly known as: DESYREL TAKE 1/2 TO 1 TABLET(25 TO 50 MG) BY MOUTH AT BEDTIME AS NEEDED FOR SLEEP        Follow-up Information     Vallarie Mare, MD Follow up in 2 week(s).   Specialty: Neurosurgery Contact information: [address] [place] [address] [phone]                 Signed: Vallarie Mare 09/26/2022, 8:30 AM

## 2022-10-01 ENCOUNTER — Other Ambulatory Visit: Payer: Self-pay | Admitting: Family Medicine

## 2022-10-01 DIAGNOSIS — F411 Generalized anxiety disorder: Secondary | ICD-10-CM

## 2022-10-30 ENCOUNTER — Encounter: Payer: Medicare Other | Admitting: Internal Medicine

## 2022-11-01 ENCOUNTER — Other Ambulatory Visit: Payer: Self-pay | Admitting: Internal Medicine

## 2022-11-25 IMAGING — CT CT ABD-PELV W/ CM
2 of 5 series · 17 of 46 positions shown, 19 images · IV contrast (Omnipaque or Isovue)
Comparison: 09/02/2019

CLINICAL DATA: Unintended weight loss

EXAM:
CT ABDOMEN AND PELVIS WITH CONTRAST
TECHNIQUE: Multidetector CT imaging of the abdomen and pelvis was performed
using the standard protocol following bolus administration of
intravenous contrast.
CONTRAST:  100mL OMNIPAQUE IOHEXOL 300 MG/ML  SOLN

[Series 2: axial st · axial · 0.73mm/px · z∈[+889,+1284]mm · 14 of 89 slices shown, 16 images]
[im 5/89  soft-tissue]
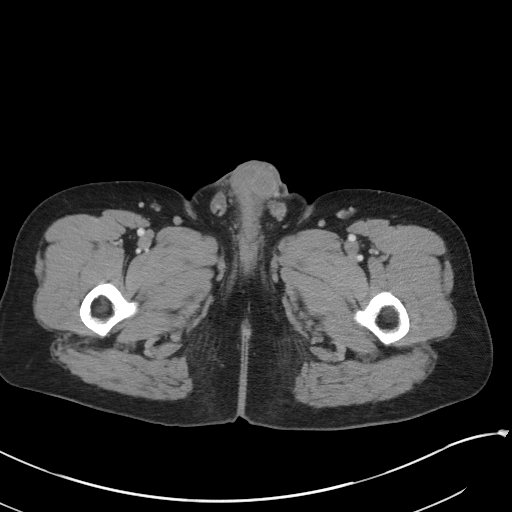
[im 5/89  bone]
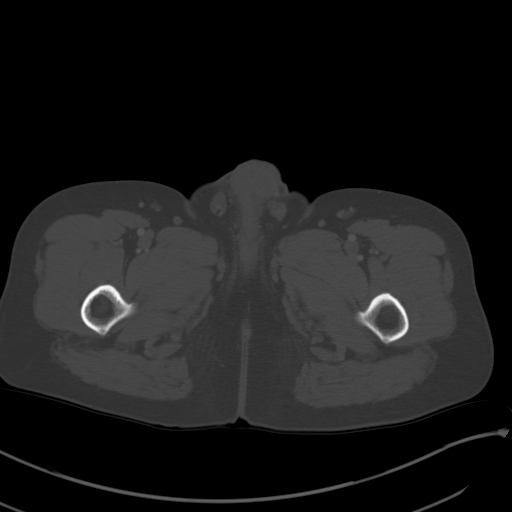
[im 10/89  soft-tissue]
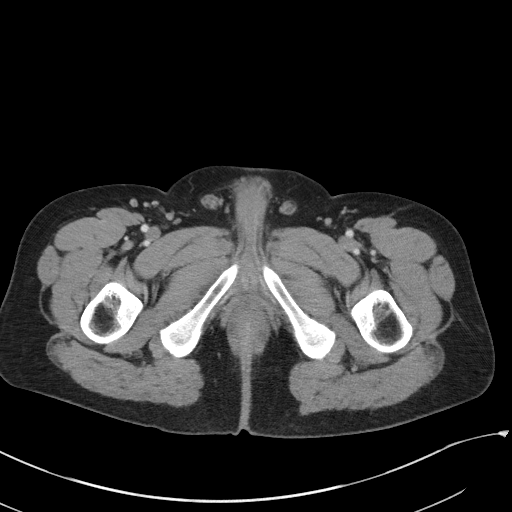
[im 20/89  soft-tissue]
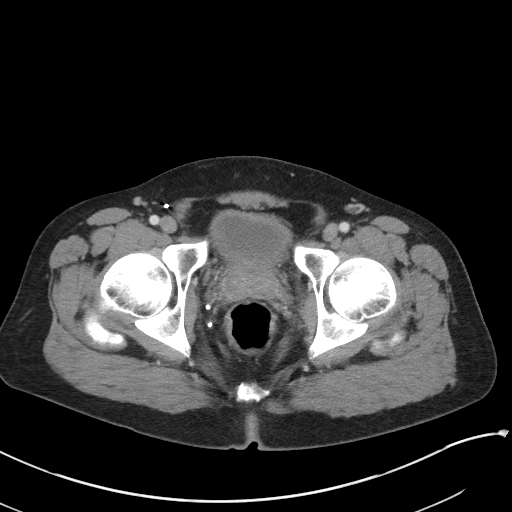
[im 25/89  soft-tissue]
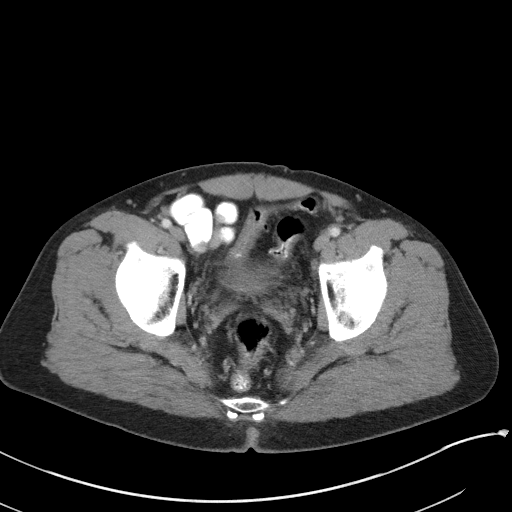
[im 30/89  soft-tissue]
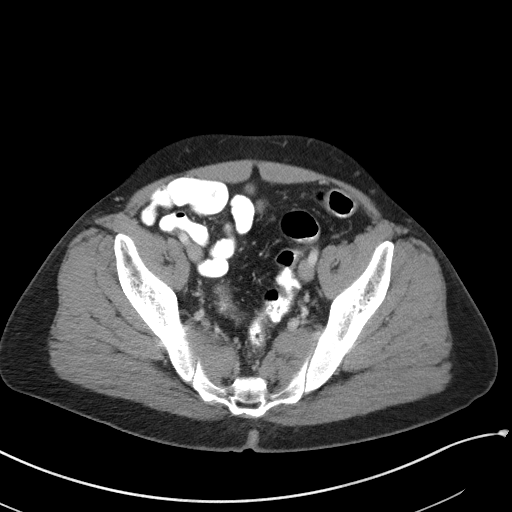
[im 35/89  soft-tissue]
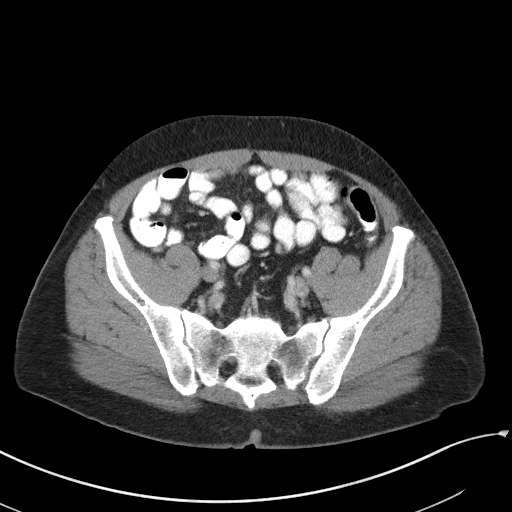
[im 40/89  soft-tissue]
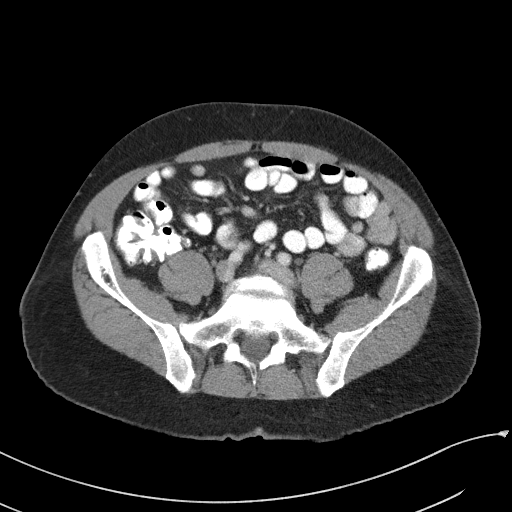
[im 49/89  soft-tissue]
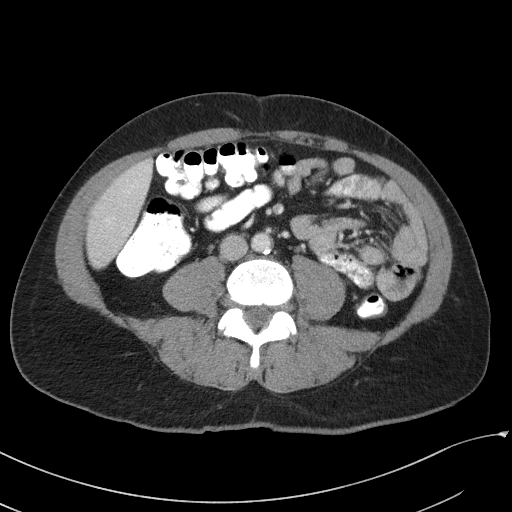
[im 54/89  soft-tissue]
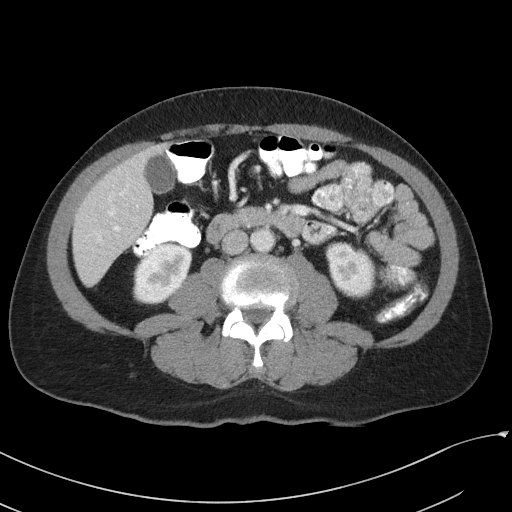
[im 54/89  bone]
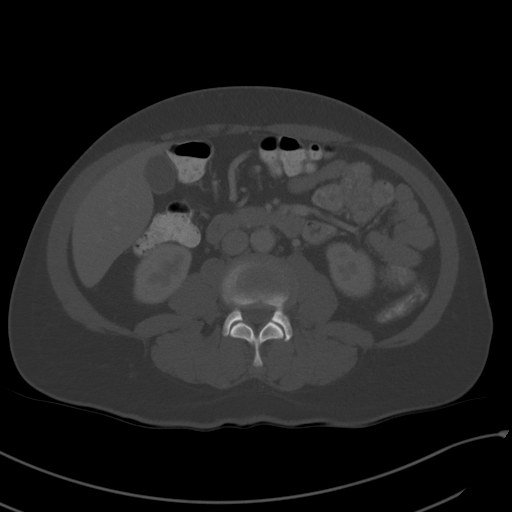
[im 59/89  soft-tissue]
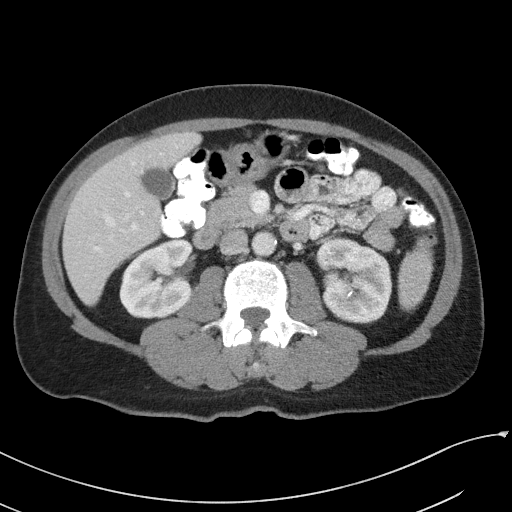
[im 64/89  soft-tissue]
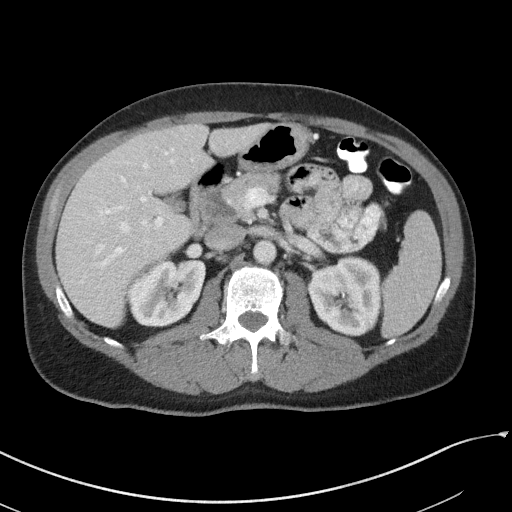
[im 69/89  soft-tissue]
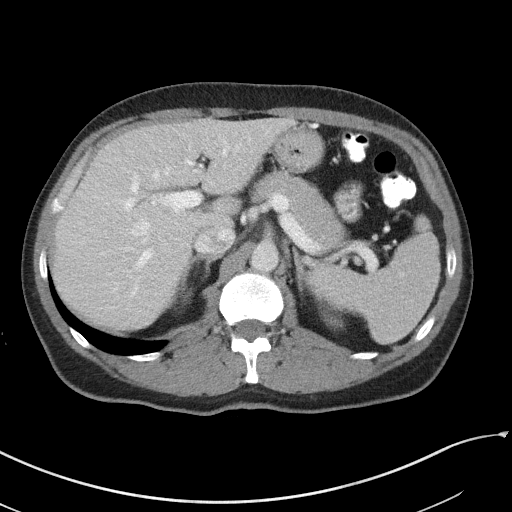
[im 79/89  soft-tissue]
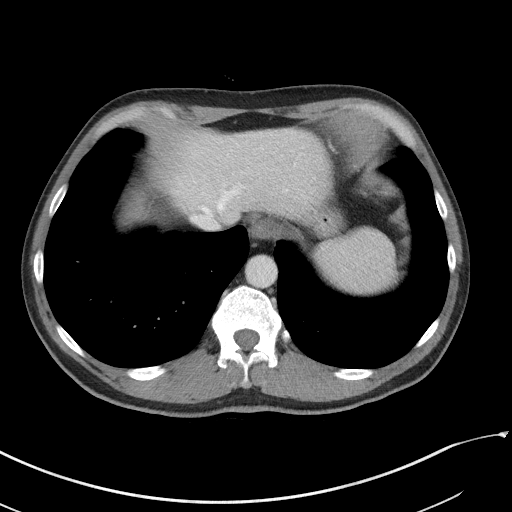
[im 84/89  soft-tissue]
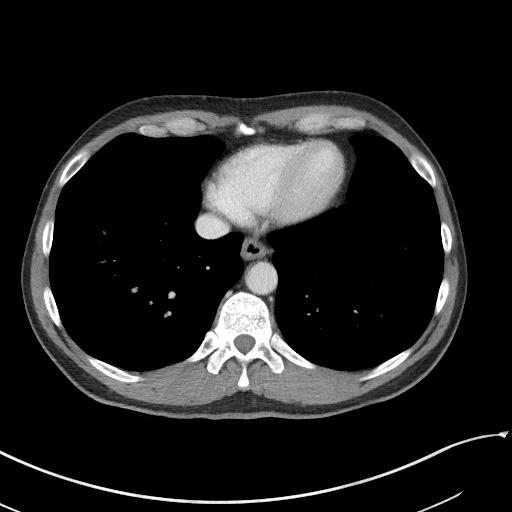

[Series 5: coronal st · coronal · 0.78mm/px · 3 of 95 slices shown]
[im 32/95  soft-tissue]
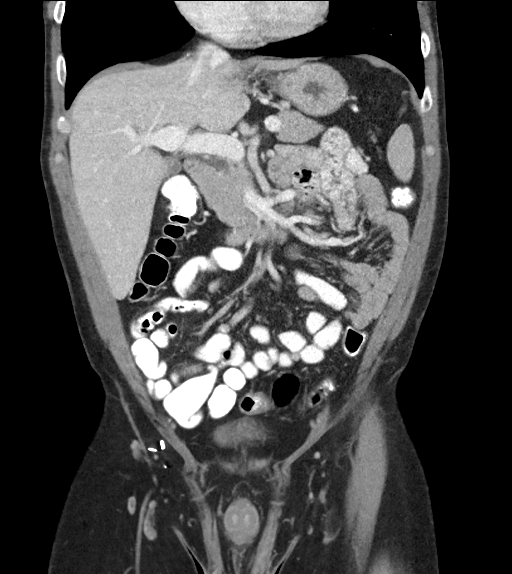
[im 42/95  soft-tissue]
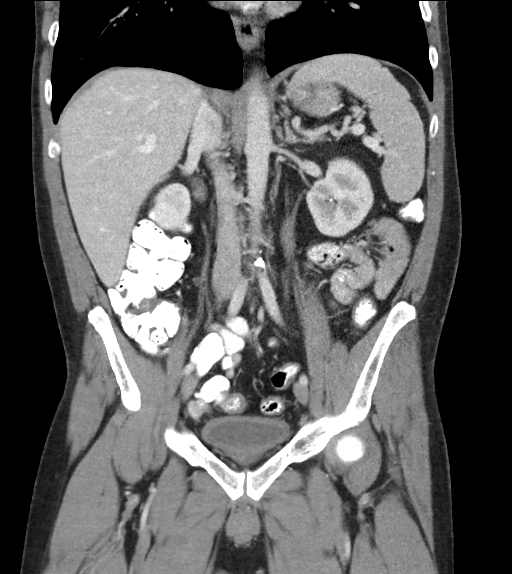
[im 53/95  soft-tissue]
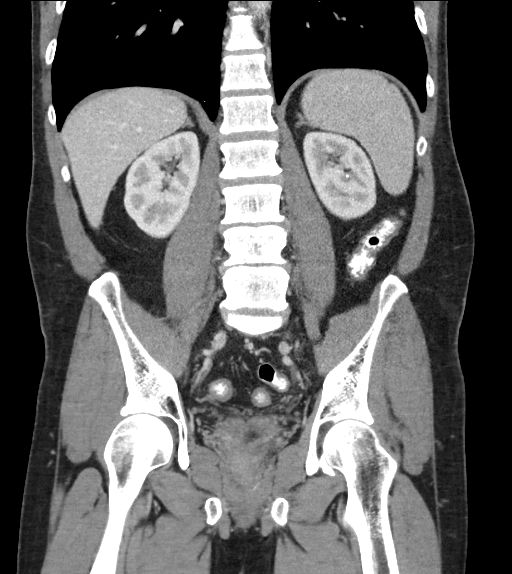

[17 of 46 positions shown; findings below may reference images not displayed]

FINDINGS: Lower chest: No acute abnormality.

Hepatobiliary: No focal liver abnormality is seen. No gallstones,
gallbladder wall thickening, or biliary dilatation.

Pancreas: Unremarkable. No pancreatic ductal dilatation or
surrounding inflammatory changes.

Spleen: Normal in size without focal abnormality.

Adrenals/Urinary Tract: Suspected left nephrolithiasis, largest
stone in the lower pole 5 mm. No hydronephrosis. Adrenal glands
unremarkable. Urinary bladder nondistended.

Stomach/Bowel: Stomach is decompressed. Small bowel is nondilated.
Normal appendix. Colon is nondilated. A few scattered sigmoid
diverticula without adjacent inflammatory change.

Vascular/Lymphatic: Scattered calcified plaque in the aorta without
aneurysm or stenosis. No abdominal or pelvic adenopathy.

Reproductive: Mild prostate enlargement.

Other: Bilateral pelvic phleboliths.  No ascites.  No free air.

Musculoskeletal: Surgical clips overlying the right common femoral
vessels. No fracture or worrisome bone lesion.
IMPRESSION: 1. No acute findings.
2. Left nephrolithiasis without hydronephrosis.
3. Sigmoid diverticulosis.

Aortic Atherosclerosis ([id number]).

## 2022-11-27 ENCOUNTER — Other Ambulatory Visit: Payer: Self-pay | Admitting: Internal Medicine

## 2022-11-28 ENCOUNTER — Ambulatory Visit (HOSPITAL_COMMUNITY): Payer: Medicare Other | Attending: Neurosurgery

## 2022-11-28 ENCOUNTER — Other Ambulatory Visit: Payer: Self-pay

## 2022-11-28 DIAGNOSIS — Z981 Arthrodesis status: Secondary | ICD-10-CM | POA: Insufficient documentation

## 2022-11-28 DIAGNOSIS — M5459 Other low back pain: Secondary | ICD-10-CM | POA: Insufficient documentation

## 2022-11-28 NOTE — Addendum Note (Signed)
Addended by: Marda Stalker on: 11/28/2022 05:30 PM   Modules accepted: Orders
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
## 2022-12-01 ENCOUNTER — Other Ambulatory Visit: Payer: Self-pay | Admitting: Family Medicine

## 2022-12-04 ENCOUNTER — Ambulatory Visit (HOSPITAL_COMMUNITY): Payer: Medicare Other | Admitting: Physical Therapy

## 2022-12-04 DIAGNOSIS — M5459 Other low back pain: Secondary | ICD-10-CM

## 2022-12-04 DIAGNOSIS — Z981 Arthrodesis status: Secondary | ICD-10-CM
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
## 2022-12-17 ENCOUNTER — Encounter (HOSPITAL_COMMUNITY): Payer: Medicare Other

## 2022-12-19 ENCOUNTER — Encounter (HOSPITAL_COMMUNITY): Payer: Medicare Other

## 2022-12-24 ENCOUNTER — Ambulatory Visit (HOSPITAL_COMMUNITY): Payer: Medicare Other

## 2022-12-24 DIAGNOSIS — M5459 Other low back pain: Secondary | ICD-10-CM

## 2022-12-24 DIAGNOSIS — Z981 Arthrodesis status: Secondary | ICD-10-CM
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
## 2022-12-26 ENCOUNTER — Encounter: Payer: Self-pay | Admitting: Internal Medicine

## 2022-12-26 ENCOUNTER — Ambulatory Visit (INDEPENDENT_AMBULATORY_CARE_PROVIDER_SITE_OTHER): Payer: Medicare Other | Admitting: Internal Medicine

## 2022-12-26 VITALS — BP 138/86 | HR 101 | Ht 69.0 in | Wt 175.2 lb

## 2022-12-26 DIAGNOSIS — E559 Vitamin D deficiency, unspecified: Secondary | ICD-10-CM

## 2022-12-26 DIAGNOSIS — Z720 Tobacco use: Secondary | ICD-10-CM

## 2022-12-26 DIAGNOSIS — Z0001 Encounter for general adult medical examination with abnormal findings: Secondary | ICD-10-CM

## 2022-12-26 DIAGNOSIS — M5416 Radiculopathy, lumbar region: Secondary | ICD-10-CM

## 2022-12-26 DIAGNOSIS — F411 Generalized anxiety disorder: Secondary | ICD-10-CM

## 2022-12-26 DIAGNOSIS — Z125 Encounter for screening for malignant neoplasm of prostate: Secondary | ICD-10-CM | POA: Insufficient documentation

## 2022-12-26 DIAGNOSIS — F17210 Nicotine dependence, cigarettes, uncomplicated: Secondary | ICD-10-CM

## 2022-12-26 DIAGNOSIS — R739 Hyperglycemia, unspecified: Secondary | ICD-10-CM

## 2022-12-26 DIAGNOSIS — E782 Mixed hyperlipidemia: Secondary | ICD-10-CM

## 2022-12-26 MED ORDER — TRAZODONE HCL 50 MG PO TABS: 75.0000 mg | ORAL_TABLET | Freq: Every evening | ORAL | 3 refills | Status: DC | PRN

## 2022-12-26 NOTE — Assessment & Plan Note (Signed)
Ordered PSA after discussing its limitations for prostate cancer screening, including false positive results leading to additional investigations.

## 2022-12-26 NOTE — Assessment & Plan Note (Signed)

## 2022-12-26 NOTE — Assessment & Plan Note (Addendum)
Well-controlled currently Has tried Zoloft and Xanax in the past amongst others Was on lithium for bipolar disorder in the past Denies to see psychiatry Continue trazodone for insomnia, increased to 75 mg nightly as he has difficulty maintaining sleep On Xanax 0.5 mg qHS PRN for now

## 2022-12-26 NOTE — Assessment & Plan Note (Addendum)
Smokes about 0.5 pack/day  Asked about quitting: confirms that he/she currently smokes cigarettes Advise to quit smoking: Educated about QUITTING to reduce the risk of cancer, cardio and cerebrovascular disease. Assess willingness: Unwilling to quit at this time, but is working on cutting back. Assist with counseling and pharmacotherapy: Counseled for 5 minutes and literature provided. Arrange for follow up: follow up in 3 months and continue to offer help.
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
## 2022-12-26 NOTE — Patient Instructions (Signed)
Please start taking Trazodone 75 mg once at bedtime for insomnia.  Please maintain simple sleep hygiene. - Maintain dark and non-noisy environment in the bedroom. - Please use the bedroom for sleep and sexual activity only. - Do not use electronic devices in the bedroom. - Please take dinner at least 2 hours before bedtime. - Please avoid caffeinated products in the evening, including coffee, soft drinks. - Please try to maintain the regular sleep-wake cycle - Go to bed and wake up at the same time.  Please continue other medications as prescribed.  Please continue to follow low salt diet and perform moderate exercise/walking at least 150 mins/week.

## 2022-12-26 NOTE — Assessment & Plan Note (Signed)
Has had lumbar fusion surgery Followed by spine surgery On Lyrica and as needed Percocet

## 2022-12-27 ENCOUNTER — Encounter (HOSPITAL_COMMUNITY): Payer: Medicare Other

## 2022-12-27 ENCOUNTER — Other Ambulatory Visit: Payer: Self-pay | Admitting: Internal Medicine

## 2022-12-27 DIAGNOSIS — F411 Generalized anxiety disorder: Secondary | ICD-10-CM

## 2022-12-27 LAB — CMP14+EGFR
ALT: 13 IU/L (ref 0–44)
AST: 19 IU/L (ref 0–40)
Albumin/Globulin Ratio: 2.1 (ref 1.2–2.2)
Albumin: 4.5 g/dL (ref 3.8–4.9)
Alkaline Phosphatase: 84 IU/L (ref 44–121)
BUN/Creatinine Ratio: 23 — ABNORMAL HIGH (ref 9–20)
BUN: 23 mg/dL (ref 6–24)
Bilirubin Total: 0.3 mg/dL (ref 0.0–1.2)
CO2: 24 mmol/L (ref 20–29)
Calcium: 9.3 mg/dL (ref 8.7–10.2)
Chloride: 107 mmol/L — ABNORMAL HIGH (ref 96–106)
Creatinine, Ser: 1 mg/dL (ref 0.76–1.27)
Globulin, Total: 2.1 g/dL (ref 1.5–4.5)
Glucose: 81 mg/dL (ref 70–99)
Potassium: 4.4 mmol/L (ref 3.5–5.2)
Sodium: 145 mmol/L — ABNORMAL HIGH (ref 134–144)
Total Protein: 6.6 g/dL (ref 6.0–8.5)
eGFR: 91 mL/min/{1.73_m2} (ref >59)

## 2022-12-27 LAB — CBC WITH DIFFERENTIAL/PLATELET
Basophils Absolute: 0.1 10*3/uL (ref 0.0–0.2)
Basos: 1 %
EOS (ABSOLUTE): 0.1 10*3/uL (ref 0.0–0.4)
Eos: 1 %
Hematocrit: 40.4 % (ref 37.5–51.0)
Hemoglobin: 13.1 g/dL (ref 13.0–17.7)
Immature Grans (Abs): 0 10*3/uL (ref 0.0–0.1)
Immature Granulocytes: 0 %
Lymphocytes Absolute: 1.8 10*3/uL (ref 0.7–3.1)
Lymphs: 30 %
MCH: 27.2 pg (ref 26.6–33.0)
MCHC: 32.4 g/dL (ref 31.5–35.7)
MCV: 84 fL (ref 79–97)
Monocytes Absolute: 0.3 10*3/uL (ref 0.1–0.9)
Monocytes: 6 %
Neutrophils Absolute: 3.8 10*3/uL (ref 1.4–7.0)
Neutrophils: 62 %
Platelets: 176 10*3/uL (ref 150–450)
RBC: 4.81 x10E6/uL (ref 4.14–5.80)
RDW: 13.1 % (ref 11.6–15.4)
WBC: 6 10*3/uL (ref 3.4–10.8)

## 2022-12-27 LAB — HEMOGLOBIN A1C
Est. average glucose Bld gHb Est-mCnc: 97 mg/dL
Hgb A1c MFr Bld: 5 % (ref 4.8–5.6)

## 2022-12-27 LAB — VITAMIN D 25 HYDROXY (VIT D DEFICIENCY, FRACTURES): Vit D, 25-Hydroxy: 20.3 ng/mL — ABNORMAL LOW (ref 30.0–100.0)

## 2022-12-27 LAB — TSH: TSH: 2.34 u[IU]/mL (ref 0.450–4.500)

## 2022-12-27 LAB — LIPID PANEL
Chol/HDL Ratio: 4.7 ratio (ref 0.0–5.0)
Cholesterol, Total: 196 mg/dL (ref 100–199)
HDL: 42 mg/dL (ref >39)
LDL Chol Calc (NIH): 124 mg/dL — ABNORMAL HIGH (ref 0–99)
Triglycerides: 171 mg/dL — ABNORMAL HIGH (ref 0–149)
VLDL Cholesterol Cal: 30 mg/dL (ref 5–40)

## 2022-12-27 LAB — PSA: Prostate Specific Ag, Serum: 1.6 ng/mL (ref 0.0–4.0)

## 2022-12-30 ENCOUNTER — Other Ambulatory Visit: Payer: Self-pay | Admitting: Internal Medicine

## 2022-12-31 ENCOUNTER — Ambulatory Visit (HOSPITAL_COMMUNITY): Payer: Medicare Other | Attending: Neurosurgery

## 2022-12-31 DIAGNOSIS — M5459 Other low back pain: Secondary | ICD-10-CM | POA: Insufficient documentation

## 2022-12-31 DIAGNOSIS — Z981 Arthrodesis status: Secondary | ICD-10-CM | POA: Insufficient documentation
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
## 2023-01-02 ENCOUNTER — Encounter (HOSPITAL_COMMUNITY): Payer: Medicare Other

## 2023-01-07 ENCOUNTER — Ambulatory Visit (HOSPITAL_COMMUNITY): Payer: Medicare Other

## 2023-01-07 DIAGNOSIS — M5459 Other low back pain: Secondary | ICD-10-CM | POA: Diagnosis not present

## 2023-01-07 DIAGNOSIS — Z981 Arthrodesis status: Secondary | ICD-10-CM
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
## 2023-01-09 ENCOUNTER — Ambulatory Visit (HOSPITAL_COMMUNITY): Payer: Medicare Other

## 2023-01-09 DIAGNOSIS — M5459 Other low back pain: Secondary | ICD-10-CM

## 2023-01-09 DIAGNOSIS — Z981 Arthrodesis status: Secondary | ICD-10-CM
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
## 2023-01-14 ENCOUNTER — Encounter (HOSPITAL_COMMUNITY): Payer: Medicare Other

## 2023-01-16 ENCOUNTER — Encounter (HOSPITAL_COMMUNITY): Payer: Medicare Other

## 2023-02-10 ENCOUNTER — Other Ambulatory Visit: Payer: Self-pay | Admitting: Internal Medicine

## 2023-02-10 DIAGNOSIS — K219 Gastro-esophageal reflux disease without esophagitis: Secondary | ICD-10-CM

## 2023-02-24 ENCOUNTER — Encounter (INDEPENDENT_AMBULATORY_CARE_PROVIDER_SITE_OTHER): Payer: Self-pay | Admitting: Gastroenterology

## 2023-02-24 ENCOUNTER — Ambulatory Visit (INDEPENDENT_AMBULATORY_CARE_PROVIDER_SITE_OTHER): Payer: Medicare Other | Admitting: Gastroenterology

## 2023-02-24 ENCOUNTER — Other Ambulatory Visit: Payer: Self-pay | Admitting: Internal Medicine

## 2023-02-24 VITALS — BP 132/84 | HR 108 | Temp 98.9°F | Ht 69.0 in | Wt 163.6 lb

## 2023-02-24 DIAGNOSIS — K580 Irritable bowel syndrome with diarrhea: Secondary | ICD-10-CM | POA: Diagnosis not present

## 2023-02-24 DIAGNOSIS — F411 Generalized anxiety disorder: Secondary | ICD-10-CM

## 2023-02-24 NOTE — Patient Instructions (Signed)
Continue baclofen 20 mg TID
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
## 2023-02-26 ENCOUNTER — Other Ambulatory Visit: Payer: Self-pay | Admitting: Internal Medicine

## 2023-04-16 ENCOUNTER — Ambulatory Visit (INDEPENDENT_AMBULATORY_CARE_PROVIDER_SITE_OTHER): Payer: Medicare Other

## 2023-04-16 VITALS — Ht 69.0 in | Wt 163.0 lb

## 2023-04-16 DIAGNOSIS — Z0000 Encounter for general adult medical examination without abnormal findings: Secondary | ICD-10-CM | POA: Diagnosis not present
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
## 2023-04-16 NOTE — Patient Instructions (Addendum)
Francis [name] , Thank you for taking time to come for your Medicare Wellness Visit. I appreciate your ongoing commitment to your health goals. Please review the following plan we discussed and let me know if I can assist you in the future.   These are the goals we discussed:  Goals      Patient Stated     Goal is to not stay so broke all of the time         This is a list of the screening recommended for you and due dates:  Health Maintenance  Topic Date Due   COVID-19 Vaccine (1) Never done   DTaP/Tdap/Td vaccine (1 - Tdap) Never done   Zoster (Shingles) Vaccine (1 of 2) Never done   Flu Shot  05/29/2023   Medicare Annual Wellness Visit  04/15/2024   Colon Cancer Screening  11/05/2025   Hepatitis C Screening  Completed   HIV Screening  Completed   HPV Vaccine  Aged Out    Advanced directives: Advance directive discussed with you today. Even though you declined this today, please call our office should you change your mind, and we can give you the proper paperwork for you to fill out. Advance care planning is a way to make decisions about medical care that fits your values in case you are ever unable to make these decisions for yourself.  Information on Advanced Care Planning can be found at [facility] Advance Health Care Directives Advance Health Care Directives ([email or web address]     Conditions/risks identified: You are due for your Tetanus, Covid, and Shingles vaccines  Next appointment: VIRTUAL/ TELEPHONE VISIT Follow up in one year for your annual wellness visit  May 12, 2024 at 1:30pm   Preventive Care 40-64 Years, Male Preventive care refers to lifestyle choices and visits with your health care provider that can promote health and wellness. What does preventive care include? A yearly physical exam. This is also called an annual well check. Dental exams once or twice a year. Routine eye exams. Ask your health care provider how often you should have  your eyes checked. Personal lifestyle choices, including: Daily care of your teeth and gums. Regular physical activity. Eating a healthy diet. Avoiding tobacco and drug use. Limiting alcohol use. Practicing safe sex. Taking low-dose aspirin every day starting at age 59. What happens during an annual well check? The services and screenings done by your health care provider during your annual well check will depend on your age, overall health, lifestyle risk factors, and family history of disease. Counseling  Your health care provider may ask you questions about your: Alcohol use. Tobacco use. Drug use. Emotional well-being. Home and relationship well-being. Sexual activity. Eating habits. Work and work Astronomer. Screening  You may have the following tests or measurements: Height, weight, and BMI. Blood pressure. Lipid and cholesterol levels. These may be checked every 5 years, or more frequently if you are over 56 years old. Skin check. Lung cancer screening. You may have this screening every year starting at age 47 if you have a 30-pack-year history of smoking and currently smoke or have quit within the past 15 years. Fecal occult blood test (FOBT) of the stool. You may have this test every year starting at age 39. Flexible sigmoidoscopy or colonoscopy. You may have a sigmoidoscopy every 5 years or a colonoscopy every 10 years starting at age 74. Prostate cancer screening. Recommendations will vary depending on your family history and other risks.  Hepatitis C blood test. Hepatitis B blood test. Sexually transmitted disease (STD) testing. Diabetes screening. This is done by checking your blood sugar (glucose) after you have not eaten for a while (fasting). You may have this done every 1-3 years. Discuss your test results, treatment options, and if necessary, the need for more tests with your health care provider. Vaccines  Your health care provider may recommend certain  vaccines, such as: Influenza vaccine. This is recommended every year. Tetanus, diphtheria, and acellular pertussis (Tdap, Td) vaccine. You may need a Td booster every 10 years. Zoster vaccine. You may need this after age 55. Pneumococcal 13-valent conjugate (PCV13) vaccine. You may need this if you have certain conditions and have not been vaccinated. Pneumococcal polysaccharide (PPSV23) vaccine. You may need one or two doses if you smoke cigarettes or if you have certain conditions. Talk to your health care provider about which screenings and vaccines you need and how often you need them. This information is not intended to replace advice given to you by your health care provider. Make sure you discuss any questions you have with your health care provider. Document Released: 11/10/2015 Document Revised: 07/03/2016 Document Reviewed: 08/15/2015 Elsevier Interactive Patient Education  2017 [employer].  Fall Prevention in the Home Falls can cause injuries. They can happen to people of all ages. There are many things you can do to make your home safe and to help prevent falls. What can I do on the outside of my home? Regularly fix the edges of walkways and driveways and fix any cracks. Remove anything that might make you trip as you walk through a door, such as a raised step or threshold. Trim any bushes or trees on the path to your home. Use bright outdoor lighting. Clear any walking paths of anything that might make someone trip, such as rocks or tools. Regularly check to see if handrails are loose or broken. Make sure that both sides of any steps have handrails. Any raised decks and porches should have guardrails on the edges. Have any leaves, snow, or ice cleared regularly. Use sand or salt on walking paths during winter. Clean up any spills in your garage right away. This includes oil or grease spills. What can I do in the bathroom? Use night lights. Install grab bars by the toilet and  in the tub and shower. Do not use towel bars as grab bars. Use non-skid mats or decals in the tub or shower. If you need to sit down in the shower, use a plastic, non-slip stool. Keep the floor dry. Clean up any water that spills on the floor as soon as it happens. Remove soap buildup in the tub or shower regularly. Attach bath mats securely with double-sided non-slip rug tape. Do not have throw rugs and other things on the floor that can make you trip. What can I do in the bedroom? Use night lights. Make sure that you have a light by your bed that is easy to reach. Do not use any sheets or blankets that are too big for your bed. They should not hang down onto the floor. Have a firm chair that has side arms. You can use this for support while you get dressed. Do not have throw rugs and other things on the floor that can make you trip. What can I do in the kitchen? Clean up any spills right away. Avoid walking on wet floors. Keep items that you use a lot in easy-to-reach places. If you  need to reach something above you, use a strong step stool that has a grab bar. Keep electrical cords out of the way. Do not use floor polish or wax that makes floors slippery. If you must use wax, use non-skid floor wax. Do not have throw rugs and other things on the floor that can make you trip. What can I do with my stairs? Do not leave any items on the stairs. Make sure that there are handrails on both sides of the stairs and use them. Fix handrails that are broken or loose. Make sure that handrails are as long as the stairways. Check any carpeting to make sure that it is firmly attached to the stairs. Fix any carpet that is loose or worn. Avoid having throw rugs at the top or bottom of the stairs. If you do have throw rugs, attach them to the floor with carpet tape. Make sure that you have a light switch at the top of the stairs and the bottom of the stairs. If you do not have them, ask someone to add  them for you. What else can I do to help prevent falls? Wear shoes that: Do not have high heels. Have rubber bottoms. Are comfortable and fit you well. Are closed at the toe. Do not wear sandals. If you use a stepladder: Make sure that it is fully opened. Do not climb a closed stepladder. Make sure that both sides of the stepladder are locked into place. Ask someone to hold it for you, if possible. Clearly mark and make sure that you can see: Any grab bars or handrails. First and last steps. Where the edge of each step is. Use tools that help you move around (mobility aids) if they are needed. These include: Canes. Walkers. Scooters. Crutches. Turn on the lights when you go into a dark area. Replace any light bulbs as soon as they burn out. Set up your furniture so you have a clear path. Avoid moving your furniture around. If any of your floors are uneven, fix them. If there are any pets around you, be aware of where they are. Review your medicines with your doctor. Some medicines can make you feel dizzy. This can increase your chance of falling. Ask your doctor what other things that you can do to help prevent falls. This information is not intended to replace advice given to you by your health care provider. Make sure you discuss any questions you have with your health care provider. Document Released: 08/10/2009 Document Revised: 03/21/2016 Document Reviewed: 11/18/2014 Elsevier Interactive Patient Education  2017 [employer].

## 2023-04-23 ENCOUNTER — Other Ambulatory Visit: Payer: Self-pay | Admitting: Internal Medicine

## 2023-04-23 DIAGNOSIS — F411 Generalized anxiety disorder: Secondary | ICD-10-CM

## 2023-04-28 ENCOUNTER — Encounter: Payer: Self-pay | Admitting: Internal Medicine

## 2023-04-28 ENCOUNTER — Ambulatory Visit (INDEPENDENT_AMBULATORY_CARE_PROVIDER_SITE_OTHER): Payer: Medicare Other | Admitting: Internal Medicine

## 2023-04-28 VITALS — BP 142/98 | HR 106 | Ht 69.0 in | Wt 163.2 lb

## 2023-04-28 DIAGNOSIS — M5416 Radiculopathy, lumbar region: Secondary | ICD-10-CM

## 2023-04-28 DIAGNOSIS — F411 Generalized anxiety disorder: Secondary | ICD-10-CM | POA: Diagnosis not present

## 2023-04-28 DIAGNOSIS — R413 Other amnesia: Secondary | ICD-10-CM | POA: Diagnosis not present

## 2023-04-28 DIAGNOSIS — F410 Panic disorder [episodic paroxysmal anxiety] without agoraphobia: Secondary | ICD-10-CM

## 2023-04-28 MED ORDER — ESCITALOPRAM OXALATE 5 MG PO TABS: 5.0000 mg | ORAL_TABLET | Freq: Every day | ORAL | 3 refills | Status: DC

## 2023-04-28 NOTE — Assessment & Plan Note (Signed)
Well-controlled currently Has tried Zoloft and Xanax in the past amongst others Was on lithium for bipolar disorder in the past Denies to see psychiatry Continue trazodone for insomnia, increased to 75 mg nightly as he has difficulty maintaining sleep On Xanax 0.5 mg qHS PRN for now
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
## 2023-04-28 NOTE — Patient Instructions (Signed)
Please start taking Lexapro as prescribed.  Please continue to take medications as prescribed.  Please continue to follow low salt diet and perform moderate exercise/walking at least 150 mins/week.

## 2023-04-28 NOTE — Assessment & Plan Note (Signed)
Has tried multiple anxiolytics in the past, including SSRI and propranolol Started Xanax as needed for severe anxiety/panic episode - now better PDMP reviewed Check urine Toxassure

## 2023-04-29 DIAGNOSIS — R413 Other amnesia: Secondary | ICD-10-CM | POA: Insufficient documentation

## 2023-04-29 NOTE — Assessment & Plan Note (Signed)
MOCA: 29/30 Unlikely to be dementia, could be mild age-related cognitive decline He has severe anxiety/depression, which could present as memory concern

## 2023-04-29 NOTE — Assessment & Plan Note (Signed)
Has had lumbar fusion surgery Followed by spine surgery On Lyrica and as needed Percocet 

## 2023-05-02 ENCOUNTER — Other Ambulatory Visit: Payer: Self-pay | Admitting: Internal Medicine

## 2023-05-02 DIAGNOSIS — F411 Generalized anxiety disorder: Secondary | ICD-10-CM

## 2023-05-21 ENCOUNTER — Other Ambulatory Visit: Payer: Self-pay | Admitting: Internal Medicine

## 2023-05-21 DIAGNOSIS — F411 Generalized anxiety disorder: Secondary | ICD-10-CM

## 2023-06-10 ENCOUNTER — Ambulatory Visit (INDEPENDENT_AMBULATORY_CARE_PROVIDER_SITE_OTHER): Payer: Medicare Other | Admitting: Internal Medicine

## 2023-06-10 ENCOUNTER — Encounter: Payer: Self-pay | Admitting: Internal Medicine

## 2023-06-10 VITALS — BP 134/86 | HR 86 | Ht 69.0 in | Wt 176.0 lb

## 2023-06-10 DIAGNOSIS — N529 Male erectile dysfunction, unspecified: Secondary | ICD-10-CM | POA: Diagnosis not present

## 2023-06-10 DIAGNOSIS — Z720 Tobacco use: Secondary | ICD-10-CM | POA: Diagnosis not present

## 2023-06-10 DIAGNOSIS — F411 Generalized anxiety disorder: Secondary | ICD-10-CM | POA: Diagnosis not present

## 2023-06-10 DIAGNOSIS — R030 Elevated blood-pressure reading, without diagnosis of hypertension: Secondary | ICD-10-CM | POA: Insufficient documentation

## 2023-06-10 DIAGNOSIS — F17210 Nicotine dependence, cigarettes, uncomplicated: Secondary | ICD-10-CM

## 2023-06-10 DIAGNOSIS — G4701 Insomnia due to medical condition: Secondary | ICD-10-CM | POA: Diagnosis not present

## 2023-06-10 MED ORDER — SILDENAFIL CITRATE 50 MG PO TABS: 50.0000 mg | ORAL_TABLET | Freq: Every day | ORAL | 0 refills | Status: DC | PRN

## 2023-06-10 MED ORDER — ESCITALOPRAM OXALATE 10 MG PO TABS: 10.0000 mg | ORAL_TABLET | Freq: Every day | ORAL | 3 refills | Status: DC

## 2023-06-10 NOTE — Assessment & Plan Note (Signed)
Likely due to SSRI Sildenafil as needed prescribed

## 2023-06-10 NOTE — Assessment & Plan Note (Signed)
Well controlled with trazodone and as needed Xanax
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
## 2023-06-10 NOTE — Assessment & Plan Note (Addendum)
Smokes about 1 pack/week  Asked about quitting: confirms that he/she currently smokes cigarettes Advise to quit smoking: Educated about QUITTING to reduce the risk of cancer, cardio and cerebrovascular disease. Assess willingness: Unwilling to quit at this time, but is working on cutting back. Assist with counseling and pharmacotherapy: Counseled for 5 minutes and literature provided. Arrange for follow up: follow up in 3 months and continue to offer help. 

## 2023-06-10 NOTE — Assessment & Plan Note (Signed)
BP Readings from Last 1 Encounters:  06/10/23 134/86   Well-controlled Advised DASH diet and moderate exercise/walking, at least 150 mins/week

## 2023-06-10 NOTE — Patient Instructions (Signed)
Please start taking Lexapro 10 mg once daily instead of 5 mg.  Please continue to take medications as prescribed.  Please continue to follow low salt diet and ambulate as tolerated.

## 2023-06-10 NOTE — Assessment & Plan Note (Addendum)
Uncontrolled currently, but is improved with Lexapro Increased dose of Lexapro to 10 mg QD Has tried Zoloft and Xanax in the past amongst others Was on lithium for bipolar disorder in the past Continue trazodone 75 mg qHS for insomnia On Xanax 0.5 mg qHS PRN for now  If persistent symptoms, will discuss about psychiatry referral again, he has denied in the past

## 2023-06-19 ENCOUNTER — Other Ambulatory Visit: Payer: Self-pay | Admitting: Internal Medicine

## 2023-07-21 ENCOUNTER — Ambulatory Visit: Payer: Medicare Other | Admitting: Urology

## 2023-07-31 ENCOUNTER — Other Ambulatory Visit: Payer: Self-pay | Admitting: Internal Medicine

## 2023-07-31 DIAGNOSIS — N529 Male erectile dysfunction, unspecified: Secondary | ICD-10-CM

## 2023-08-14 ENCOUNTER — Other Ambulatory Visit: Payer: Self-pay | Admitting: Internal Medicine

## 2023-09-03 ENCOUNTER — Other Ambulatory Visit: Payer: Self-pay | Admitting: Internal Medicine

## 2023-09-03 DIAGNOSIS — F411 Generalized anxiety disorder: Secondary | ICD-10-CM

## 2023-09-03 DIAGNOSIS — N529 Male erectile dysfunction, unspecified: Secondary | ICD-10-CM

## 2023-09-11 ENCOUNTER — Ambulatory Visit (INDEPENDENT_AMBULATORY_CARE_PROVIDER_SITE_OTHER): Payer: Medicare Other | Admitting: Internal Medicine

## 2023-09-11 ENCOUNTER — Encounter: Payer: Self-pay | Admitting: Internal Medicine

## 2023-09-11 VITALS — BP 126/83 | HR 87 | Ht 69.0 in | Wt 179.0 lb

## 2023-09-11 DIAGNOSIS — Z720 Tobacco use: Secondary | ICD-10-CM

## 2023-09-11 DIAGNOSIS — K580 Irritable bowel syndrome with diarrhea: Secondary | ICD-10-CM

## 2023-09-11 DIAGNOSIS — E559 Vitamin D deficiency, unspecified: Secondary | ICD-10-CM

## 2023-09-11 DIAGNOSIS — Z79899 Other long term (current) drug therapy: Secondary | ICD-10-CM

## 2023-09-11 DIAGNOSIS — F411 Generalized anxiety disorder: Secondary | ICD-10-CM | POA: Diagnosis not present

## 2023-09-11 DIAGNOSIS — M5416 Radiculopathy, lumbar region: Secondary | ICD-10-CM | POA: Diagnosis not present

## 2023-09-11 DIAGNOSIS — R000 Tachycardia, unspecified: Secondary | ICD-10-CM

## 2023-09-11 DIAGNOSIS — Z125 Encounter for screening for malignant neoplasm of prostate: Secondary | ICD-10-CM

## 2023-09-11 DIAGNOSIS — F17210 Nicotine dependence, cigarettes, uncomplicated: Secondary | ICD-10-CM | POA: Diagnosis not present

## 2023-09-11 DIAGNOSIS — E782 Mixed hyperlipidemia: Secondary | ICD-10-CM

## 2023-09-11 DIAGNOSIS — R739 Hyperglycemia, unspecified: Secondary | ICD-10-CM

## 2023-09-11 DIAGNOSIS — R1011 Right upper quadrant pain: Secondary | ICD-10-CM | POA: Diagnosis not present

## 2023-09-11 MED ORDER — ESCITALOPRAM OXALATE 10 MG PO TABS: 15.0000 mg | ORAL_TABLET | Freq: Every day | ORAL | 3 refills | Status: DC

## 2023-09-11 NOTE — Assessment & Plan Note (Signed)
Used to take metoprolol 12.5 mg QD for tachycardia Has not had tachycardia/palpitations recently despite stopping metoprolol, DC metoprolol

## 2023-09-11 NOTE — Assessment & Plan Note (Signed)
IBS-D, takes Baclofen for it Followed by GI

## 2023-09-11 NOTE — Assessment & Plan Note (Signed)
Has intermittent, likely colicky abdominal pain Check [place] RUQ abdomen Avoid oily, fried food

## 2023-09-11 NOTE — Assessment & Plan Note (Addendum)
Uncontrolled currently, but is improved with Lexapro 10 mg QD Increased dose of Lexapro to 15 mg QD Has tried Zoloft and Xanax in the past amongst others Was on lithium for bipolar disorder in the past Continue trazodone 75 mg qHS for insomnia On Xanax 0.5 mg qHS PRN for now  If persistent symptoms, will discuss about psychiatry referral again, he has denied in the past

## 2023-09-11 NOTE — Assessment & Plan Note (Signed)
Smokes about 1 pack/week  Asked about quitting: confirms that he/she currently smokes cigarettes Advise to quit smoking: Educated about QUITTING to reduce the risk of cancer, cardio and cerebrovascular disease. Assess willingness: Unwilling to quit at this time, but is working on cutting back. Assist with counseling and pharmacotherapy: Counseled for 5 minutes and literature provided. Arrange for follow up: follow up in 3 months and continue to offer help. 
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
## 2023-09-11 NOTE — Assessment & Plan Note (Signed)
Ordered PSA after discussing its limitations for prostate cancer screening, including false positive results leading to additional investigations. 

## 2023-09-11 NOTE — Assessment & Plan Note (Signed)
Has had lumbar fusion surgery Followed by spine surgery On Lyrica and as needed Percocet 

## 2023-09-11 NOTE — Patient Instructions (Addendum)
Start taking Lexapro 1.5 tablets once daily as prescribed.  Please continue to take other medications as prescribed.  Please continue to follow low salt diet and perform moderate exercise/walking at least 150 mins/week.  Please get fasting blood tests done before the next visit.

## 2023-09-18 LAB — TOXASSURE SELECT 13 (MW), URINE

## 2023-09-19 ENCOUNTER — Ambulatory Visit (HOSPITAL_COMMUNITY)
Admission: RE | Admit: 2023-09-19 | Discharge: 2023-09-19 | Disposition: A | Discharge status: Home / Self Care | Payer: Medicare Other | Source: Ambulatory Visit | Attending: Internal Medicine | Admitting: Internal Medicine

## 2023-09-19 DIAGNOSIS — R1011 Right upper quadrant pain: Secondary | ICD-10-CM | POA: Diagnosis present

## 2023-10-02 ENCOUNTER — Other Ambulatory Visit: Payer: Self-pay | Admitting: Internal Medicine

## 2023-10-02 DIAGNOSIS — N529 Male erectile dysfunction, unspecified: Secondary | ICD-10-CM

## 2023-10-03 ENCOUNTER — Other Ambulatory Visit: Payer: Self-pay | Admitting: Internal Medicine

## 2023-11-02 ENCOUNTER — Other Ambulatory Visit: Payer: Self-pay | Admitting: Internal Medicine

## 2023-11-02 DIAGNOSIS — K219 Gastro-esophageal reflux disease without esophagitis: Secondary | ICD-10-CM

## 2023-11-17 ENCOUNTER — Other Ambulatory Visit: Payer: Self-pay | Admitting: Internal Medicine

## 2023-11-17 DIAGNOSIS — F411 Generalized anxiety disorder: Secondary | ICD-10-CM

## 2023-12-04 ENCOUNTER — Other Ambulatory Visit: Payer: Self-pay | Admitting: Internal Medicine

## 2023-12-31 ENCOUNTER — Other Ambulatory Visit: Payer: Self-pay | Admitting: Internal Medicine

## 2023-12-31 DIAGNOSIS — N529 Male erectile dysfunction, unspecified: Secondary | ICD-10-CM

## 2024-01-02 ENCOUNTER — Other Ambulatory Visit: Payer: Self-pay | Admitting: Internal Medicine

## 2024-01-02 DIAGNOSIS — F411 Generalized anxiety disorder: Secondary | ICD-10-CM

## 2024-01-07 ENCOUNTER — Other Ambulatory Visit: Payer: Self-pay | Admitting: Internal Medicine

## 2024-01-07 DIAGNOSIS — F411 Generalized anxiety disorder: Secondary | ICD-10-CM

## 2024-01-10 LAB — CMP14+EGFR
ALT: 14 IU/L (ref 0–44)
AST: 18 IU/L (ref 0–40)
Albumin: 4.6 g/dL (ref 3.8–4.9)
Alkaline Phosphatase: 92 IU/L (ref 44–121)
BUN/Creatinine Ratio: 24 — ABNORMAL HIGH (ref 9–20)
BUN: 27 mg/dL — ABNORMAL HIGH (ref 6–24)
Bilirubin Total: 0.3 mg/dL (ref 0.0–1.2)
CO2: 21 mmol/L (ref 20–29)
Calcium: 9.6 mg/dL (ref 8.7–10.2)
Chloride: 106 mmol/L (ref 96–106)
Creatinine, Ser: 1.12 mg/dL (ref 0.76–1.27)
Globulin, Total: 2.1 g/dL (ref 1.5–4.5)
Glucose: 153 mg/dL — ABNORMAL HIGH (ref 70–99)
Potassium: 4.9 mmol/L (ref 3.5–5.2)
Sodium: 142 mmol/L (ref 134–144)
Total Protein: 6.7 g/dL (ref 6.0–8.5)
eGFR: 79 mL/min/{1.73_m2} (ref >59)

## 2024-01-10 LAB — CBC WITH DIFFERENTIAL/PLATELET
Basophils Absolute: 0.1 10*3/uL (ref 0.0–0.2)
Basos: 1 %
EOS (ABSOLUTE): 0 10*3/uL (ref 0.0–0.4)
Eos: 1 %
Hematocrit: 43.8 % (ref 37.5–51.0)
Hemoglobin: 14.1 g/dL (ref 13.0–17.7)
Immature Grans (Abs): 0 10*3/uL (ref 0.0–0.1)
Immature Granulocytes: 0 %
Lymphocytes Absolute: 1.3 10*3/uL (ref 0.7–3.1)
Lymphs: 29 %
MCH: 27.2 pg (ref 26.6–33.0)
MCHC: 32.2 g/dL (ref 31.5–35.7)
MCV: 84 fL (ref 79–97)
Monocytes Absolute: 0.3 10*3/uL (ref 0.1–0.9)
Monocytes: 6 %
Neutrophils Absolute: 2.7 10*3/uL (ref 1.4–7.0)
Neutrophils: 63 %
Platelets: 191 10*3/uL (ref 150–450)
RBC: 5.19 x10E6/uL (ref 4.14–5.80)
RDW: 13.6 % (ref 11.6–15.4)
WBC: 4.4 10*3/uL (ref 3.4–10.8)

## 2024-01-10 LAB — LIPID PANEL
Chol/HDL Ratio: 5 ratio (ref 0.0–5.0)
Cholesterol, Total: 200 mg/dL — ABNORMAL HIGH (ref 100–199)
HDL: 40 mg/dL (ref >39)
LDL Chol Calc (NIH): 138 mg/dL — ABNORMAL HIGH (ref 0–99)
Triglycerides: 121 mg/dL (ref 0–149)
VLDL Cholesterol Cal: 22 mg/dL (ref 5–40)

## 2024-01-10 LAB — TSH: TSH: 1.23 u[IU]/mL (ref 0.450–4.500)

## 2024-01-10 LAB — HEMOGLOBIN A1C
Est. average glucose Bld gHb Est-mCnc: 97 mg/dL
Hgb A1c MFr Bld: 5 % (ref 4.8–5.6)

## 2024-01-10 LAB — VITAMIN D 25 HYDROXY (VIT D DEFICIENCY, FRACTURES): Vit D, 25-Hydroxy: 25.9 ng/mL — ABNORMAL LOW (ref 30.0–100.0)

## 2024-01-12 ENCOUNTER — Ambulatory Visit (INDEPENDENT_AMBULATORY_CARE_PROVIDER_SITE_OTHER): Payer: Medicare Other | Admitting: Internal Medicine

## 2024-01-12 ENCOUNTER — Encounter: Payer: Self-pay | Admitting: Internal Medicine

## 2024-01-12 VITALS — BP 134/85 | HR 115 | Ht 69.0 in | Wt 182.4 lb

## 2024-01-12 DIAGNOSIS — Z720 Tobacco use: Secondary | ICD-10-CM | POA: Diagnosis not present

## 2024-01-12 DIAGNOSIS — K219 Gastro-esophageal reflux disease without esophagitis: Secondary | ICD-10-CM | POA: Diagnosis not present

## 2024-01-12 DIAGNOSIS — F411 Generalized anxiety disorder: Secondary | ICD-10-CM

## 2024-01-12 DIAGNOSIS — Z0001 Encounter for general adult medical examination with abnormal findings: Secondary | ICD-10-CM

## 2024-01-12 DIAGNOSIS — M5416 Radiculopathy, lumbar region: Secondary | ICD-10-CM

## 2024-01-12 DIAGNOSIS — E559 Vitamin D deficiency, unspecified: Secondary | ICD-10-CM | POA: Insufficient documentation

## 2024-01-12 DIAGNOSIS — G4709 Other insomnia: Secondary | ICD-10-CM

## 2024-01-12 NOTE — Assessment & Plan Note (Addendum)

## 2024-01-12 NOTE — Assessment & Plan Note (Addendum)
 Better-controlled currently, but is improved with Lexapro 15 mg QD Has tried Zoloft and Xanax in the past amongst others Was on lithium for bipolar disorder in the past Continue trazodone 75 mg qHS for insomnia On Xanax 0.5 mg qHS PRN for now  Referred to [facility] therapy If persistent symptoms, will discuss about psychiatry referral again, he has denied in the past

## 2024-01-12 NOTE — Patient Instructions (Addendum)
 Please continue to take medications as prescribed.  Please continue to follow heart healthy diet and perform moderate exercise/walking at least 150 mins/week.  Please start taking Vitamin D 2000 IU once daily.  Please consider getting Shingrix and Tdap vaccine at local pharmacy.
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
## 2024-01-12 NOTE — Assessment & Plan Note (Signed)
 Has had lumbar fusion surgery Followed by spine surgery On baclofen and as needed Percocet

## 2024-01-12 NOTE — Assessment & Plan Note (Signed)
Smokes about 1 pack/week  Asked about quitting: confirms that he/she currently smokes cigarettes Advise to quit smoking: Educated about QUITTING to reduce the risk of cancer, cardio and cerebrovascular disease. Assess willingness: Unwilling to quit at this time, but is working on cutting back. Assist with counseling and pharmacotherapy: Counseled for 5 minutes and literature provided. Arrange for follow up: follow up in 3 months and continue to offer help. 

## 2024-01-12 NOTE — Assessment & Plan Note (Signed)
Well-controlled with Omeprazole 

## 2024-01-12 NOTE — Assessment & Plan Note (Signed)
Well controlled with trazodone and as needed Xanax

## 2024-01-12 NOTE — Assessment & Plan Note (Signed)
 Last vitamin D Lab Results  Component Value Date   VD25OH 25.9 (L) 01/09/2024   Advised to take Vitamin D 2000 IU QD

## 2024-01-23 ENCOUNTER — Institutional Professional Consult (permissible substitution): Payer: Self-pay | Admitting: Professional Counselor

## 2024-02-02 ENCOUNTER — Other Ambulatory Visit: Payer: Self-pay | Admitting: Internal Medicine

## 2024-02-03 ENCOUNTER — Other Ambulatory Visit: Payer: Self-pay | Admitting: Internal Medicine

## 2024-02-03 DIAGNOSIS — F411 Generalized anxiety disorder: Secondary | ICD-10-CM

## 2024-02-09 ENCOUNTER — Ambulatory Visit
Admission: EM | Admit: 2024-02-09 | Discharge: 2024-02-09 | Disposition: A | Discharge status: Home / Self Care | Attending: Family Medicine | Admitting: Family Medicine

## 2024-02-09 DIAGNOSIS — M5432 Sciatica, left side: Secondary | ICD-10-CM | POA: Diagnosis not present

## 2024-02-09 DIAGNOSIS — S39012A Strain of muscle, fascia and tendon of lower back, initial encounter: Secondary | ICD-10-CM | POA: Diagnosis not present

## 2024-02-09 MED ORDER — TIZANIDINE HCL 4 MG PO CAPS: 4.0000 mg | ORAL_CAPSULE | Freq: Three times a day (TID) | ORAL | 0 refills | Status: DC | PRN

## 2024-02-09 MED ORDER — PREDNISONE 20 MG PO TABS: 40.0000 mg | ORAL_TABLET | Freq: Every day | ORAL | 0 refills | Status: DC

## 2024-02-09 NOTE — ED Triage Notes (Addendum)
 Pt reports sharp stabbing pains in the left side radiating into the thigh, when sitting to standing and standing to walking pain is located near the kidney x 1 week
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
## 2024-02-29 ENCOUNTER — Other Ambulatory Visit: Payer: Self-pay | Admitting: Internal Medicine

## 2024-02-29 DIAGNOSIS — N529 Male erectile dysfunction, unspecified: Secondary | ICD-10-CM

## 2024-04-03 ENCOUNTER — Other Ambulatory Visit: Payer: Self-pay | Admitting: Internal Medicine

## 2024-04-03 DIAGNOSIS — K219 Gastro-esophageal reflux disease without esophagitis: Secondary | ICD-10-CM

## 2024-05-03 ENCOUNTER — Other Ambulatory Visit: Payer: Self-pay | Admitting: Internal Medicine

## 2024-05-03 DIAGNOSIS — F411 Generalized anxiety disorder: Secondary | ICD-10-CM

## 2024-05-04 ENCOUNTER — Other Ambulatory Visit: Payer: Self-pay | Admitting: Internal Medicine

## 2024-05-04 DIAGNOSIS — F411 Generalized anxiety disorder: Secondary | ICD-10-CM

## 2024-05-13 ENCOUNTER — Ambulatory Visit (INDEPENDENT_AMBULATORY_CARE_PROVIDER_SITE_OTHER): Admitting: Internal Medicine

## 2024-05-13 ENCOUNTER — Encounter: Payer: Self-pay | Admitting: Internal Medicine

## 2024-05-13 VITALS — BP 138/78 | HR 98 | Ht 69.0 in | Wt 181.4 lb

## 2024-05-13 DIAGNOSIS — G4701 Insomnia due to medical condition: Secondary | ICD-10-CM

## 2024-05-13 DIAGNOSIS — N3943 Post-void dribbling: Secondary | ICD-10-CM | POA: Diagnosis not present

## 2024-05-13 DIAGNOSIS — F411 Generalized anxiety disorder: Secondary | ICD-10-CM | POA: Diagnosis not present

## 2024-05-13 DIAGNOSIS — F410 Panic disorder [episodic paroxysmal anxiety] without agoraphobia: Secondary | ICD-10-CM | POA: Diagnosis not present

## 2024-05-13 DIAGNOSIS — M5416 Radiculopathy, lumbar region: Secondary | ICD-10-CM

## 2024-05-13 DIAGNOSIS — N401 Enlarged prostate with lower urinary tract symptoms: Secondary | ICD-10-CM | POA: Diagnosis not present

## 2024-05-13 DIAGNOSIS — N521 Erectile dysfunction due to diseases classified elsewhere: Secondary | ICD-10-CM | POA: Diagnosis not present

## 2024-05-13 MED ORDER — TADALAFIL 5 MG PO TABS: 5.0000 mg | ORAL_TABLET | Freq: Every day | ORAL | 1 refills | Status: DC
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
## 2024-05-13 NOTE — Assessment & Plan Note (Signed)
 Has tried multiple anxiolytics in the past, including SSRI and propranolol  Has Xanax  as needed for severe anxiety/panic episode On Lexapro  15 mg QD for GAD/panic disorder PDMP reviewed

## 2024-05-13 NOTE — Assessment & Plan Note (Addendum)
 Has had lumbar fusion surgery Followed by spine surgery On baclofen  20 mg TID, was on as needed Percocet as well in the past

## 2024-05-13 NOTE — Assessment & Plan Note (Signed)
 Likely due to SSRI Sildenafil  as needed prescribed, but has noticed lack of sexual desire lately Started Cialis  5 mg QD, can also help with BPH symptoms

## 2024-05-13 NOTE — Assessment & Plan Note (Signed)
Well controlled with trazodone and as needed Xanax

## 2024-05-13 NOTE — Assessment & Plan Note (Signed)
 Better-controlled currently, improved with Lexapro  15 mg QD Has tried Zoloft in the past amongst others Was on lithium  for bipolar disorder in the past Continue trazodone  75 mg qHS for insomnia On Xanax  0.5 mg qHS PRN for now  If persistent symptoms, will discuss about psychiatry referral again, he has denied in the past

## 2024-05-13 NOTE — Assessment & Plan Note (Signed)
 Has postvoid dribbling, likely due to BPH Considering his erectile dysfunction, started Cialis  5 mg QD

## 2024-05-13 NOTE — Patient Instructions (Addendum)
 Please start taking Cialis  5 mg once daily.  Please continue to take medications as prescribed.  Please continue to follow low salt diet and perform moderate exercise/walking at least 150 mins/week.  Please consider getting Shingrix and Tdap vaccine at local pharmacy.

## 2024-05-14 ENCOUNTER — Telehealth: Payer: Self-pay | Admitting: Pharmacy Technician

## 2024-05-14 ENCOUNTER — Other Ambulatory Visit (HOSPITAL_COMMUNITY): Payer: Self-pay

## 2024-05-14 NOTE — Telephone Encounter (Signed)
 Pharmacy Patient Advocate Encounter   Received notification from CoverMyMeds that prior authorization for Tadalafil  5mg  tablets is required/requested.   Insurance verification completed.   The patient is insured through [employer] .   Per test claim: PA required; PA submitted to above mentioned insurance via LATENT Key/confirmation #/EOC [facility] Status is pending

## 2024-05-17 NOTE — Telephone Encounter (Signed)
 Pharmacy Patient Advocate Encounter  Received notification from [facility] that Prior Authorization for Tadalafil  5mg  tablets  has been DENIED.  Full denial letter will be uploaded to the media tab. See denial reason below.   PA #/Case ID/Reference #: [id number]

## 2024-05-17 NOTE — Telephone Encounter (Signed)
 Spoke to pt states he picked up medication with good rx coupon that was given to him at office visit.

## 2024-05-27 ENCOUNTER — Other Ambulatory Visit: Payer: Self-pay | Admitting: Internal Medicine

## 2024-05-27 DIAGNOSIS — N529 Male erectile dysfunction, unspecified: Secondary | ICD-10-CM

## 2024-05-28 ENCOUNTER — Other Ambulatory Visit: Payer: Self-pay | Admitting: Internal Medicine

## 2024-05-29 ENCOUNTER — Ambulatory Visit
Admission: EM | Admit: 2024-05-29 | Discharge: 2024-05-29 | Disposition: A | Discharge status: Home / Self Care | Attending: Nurse Practitioner | Admitting: Nurse Practitioner

## 2024-05-29 DIAGNOSIS — M5441 Lumbago with sciatica, right side: Secondary | ICD-10-CM | POA: Diagnosis not present

## 2024-05-29 DIAGNOSIS — G8929 Other chronic pain: Secondary | ICD-10-CM | POA: Diagnosis not present

## 2024-05-29 MED ORDER — TIZANIDINE HCL 4 MG PO TABS: 4.0000 mg | ORAL_TABLET | Freq: Three times a day (TID) | ORAL | 0 refills | Status: DC | PRN

## 2024-05-29 MED ORDER — DEXAMETHASONE SODIUM PHOSPHATE 10 MG/ML IJ SOLN
10.0000 mg | INTRAMUSCULAR | Status: AC
  Administered 2024-05-29: 10 mg via INTRAMUSCULAR

## 2024-05-29 MED ORDER — KETOROLAC TROMETHAMINE 30 MG/ML IJ SOLN
30.0000 mg | Freq: Once | INTRAMUSCULAR | Status: AC
  Administered 2024-05-29: 30 mg via INTRAMUSCULAR

## 2024-05-29 MED ORDER — PREDNISONE 20 MG PO TABS: 40.0000 mg | ORAL_TABLET | Freq: Every day | ORAL | 0 refills | Status: AC

## 2024-05-29 NOTE — Discharge Instructions (Addendum)
 You were given injections of Toradol  30 mg and Decadron  10 mg.  Do not take any additional NSAIDs today to include Advil , ibuprofen , Motrin , Aleve , or naproxen .  Recommend Tylenol  for breakthrough pain or discomfort. Take medication as prescribed.  Start the prednisone  on 05/30/2024. Increase fluids and allow for plenty of rest. Recommend use of ice or heat.  Apply ice for pain or swelling, heat for spasm or stiffness.  Apply for 20 minutes, remove for 1 hour, repeat as needed. Gentle stretching exercises to help with your back pain.  I provided exercises for you to perform.  Try to perform the exercises at least 2-3 times daily. Go to the emergency department immediately if you experience loss of bowel or bladder function, become unable to ambulate, or have worsening pain. Follow-up with your back surgeon if symptoms continue to persist. Follow-up as needed.

## 2024-05-29 NOTE — ED Triage Notes (Signed)
 Pt reports he has pain from his right hip radiating down to his right knee x 3 days. States he is walking differently because he cannot put pressure on leg.
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
## 2024-06-04 ENCOUNTER — Other Ambulatory Visit: Payer: Self-pay | Admitting: Internal Medicine

## 2024-07-06 ENCOUNTER — Other Ambulatory Visit: Payer: Self-pay | Admitting: Internal Medicine

## 2024-07-06 DIAGNOSIS — F411 Generalized anxiety disorder: Secondary | ICD-10-CM

## 2024-07-27 ENCOUNTER — Other Ambulatory Visit: Payer: Self-pay | Admitting: Internal Medicine

## 2024-07-27 DIAGNOSIS — K219 Gastro-esophageal reflux disease without esophagitis: Secondary | ICD-10-CM

## 2024-08-11 ENCOUNTER — Encounter (INDEPENDENT_AMBULATORY_CARE_PROVIDER_SITE_OTHER): Payer: Self-pay | Admitting: Gastroenterology

## 2024-09-02 ENCOUNTER — Other Ambulatory Visit: Payer: Self-pay | Admitting: Internal Medicine

## 2024-09-02 DIAGNOSIS — F411 Generalized anxiety disorder: Secondary | ICD-10-CM

## 2024-09-09 ENCOUNTER — Ambulatory Visit: Admitting: Internal Medicine

## 2024-09-09 ENCOUNTER — Encounter: Payer: Self-pay | Admitting: Internal Medicine

## 2024-09-09 VITALS — BP 116/79 | HR 87 | Ht 69.0 in | Wt 182.8 lb

## 2024-09-09 DIAGNOSIS — E782 Mixed hyperlipidemia: Secondary | ICD-10-CM

## 2024-09-09 DIAGNOSIS — R739 Hyperglycemia, unspecified: Secondary | ICD-10-CM

## 2024-09-09 DIAGNOSIS — E559 Vitamin D deficiency, unspecified: Secondary | ICD-10-CM

## 2024-09-09 DIAGNOSIS — F410 Panic disorder [episodic paroxysmal anxiety] without agoraphobia: Secondary | ICD-10-CM

## 2024-09-09 DIAGNOSIS — K219 Gastro-esophageal reflux disease without esophagitis: Secondary | ICD-10-CM | POA: Diagnosis not present

## 2024-09-09 DIAGNOSIS — N3943 Post-void dribbling: Secondary | ICD-10-CM

## 2024-09-09 DIAGNOSIS — F411 Generalized anxiety disorder: Secondary | ICD-10-CM | POA: Diagnosis not present

## 2024-09-09 DIAGNOSIS — G4700 Insomnia, unspecified: Secondary | ICD-10-CM

## 2024-09-09 DIAGNOSIS — N401 Enlarged prostate with lower urinary tract symptoms: Secondary | ICD-10-CM | POA: Diagnosis not present

## 2024-09-09 DIAGNOSIS — Z2821 Immunization not carried out because of patient refusal: Secondary | ICD-10-CM

## 2024-09-09 MED ORDER — TRAZODONE HCL 50 MG PO TABS: 75.0000 mg | ORAL_TABLET | Freq: Every evening | ORAL | 3 refills | Status: AC | PRN

## 2024-09-09 MED ORDER — ESCITALOPRAM OXALATE 10 MG PO TABS: 15.0000 mg | ORAL_TABLET | Freq: Every day | ORAL | 3 refills | Status: AC
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
## 2024-09-09 NOTE — Assessment & Plan Note (Signed)
 Has tried multiple anxiolytics in the past, including SSRI and propranolol  Has Xanax  as needed for severe anxiety/panic episode, PDMP reviewed On Lexapro  15 mg QD for GAD/panic disorder

## 2024-09-09 NOTE — Assessment & Plan Note (Signed)
 Has postvoid dribbling, likely due to BPH Considering his erectile dysfunction, continue Cialis  5 mg QD

## 2024-09-09 NOTE — Assessment & Plan Note (Signed)
Well controlled with trazodone and as needed Xanax

## 2024-09-09 NOTE — Assessment & Plan Note (Signed)
 Better-controlled currently, improved with Lexapro  15 mg QD Has tried Zoloft in the past amongst others Was on lithium  for bipolar disorder in the past Continue trazodone  75 mg qHS for insomnia On Xanax  0.5 mg qHS PRN for now  If persistent symptoms, will discuss about psychiatry referral again, he has denied in the past

## 2024-09-09 NOTE — Assessment & Plan Note (Signed)
 Last vitamin D Lab Results  Component Value Date   VD25OH 25.9 (L) 01/09/2024   Advised to take Vitamin D 2000 IU QD

## 2024-09-09 NOTE — Assessment & Plan Note (Signed)
Well-controlled with Omeprazole 

## 2024-09-09 NOTE — Patient Instructions (Addendum)
 Please continue to take medications as prescribed.  Please continue to follow low salt diet and perform moderate exercise/walking at least 150 mins/week.  Please get fasting blood tests done before the next visit.

## 2024-10-01 ENCOUNTER — Other Ambulatory Visit: Payer: Self-pay | Admitting: Internal Medicine

## 2024-10-18 ENCOUNTER — Other Ambulatory Visit: Payer: Self-pay | Admitting: Internal Medicine

## 2024-11-02 ENCOUNTER — Ambulatory Visit: Payer: Self-pay | Admitting: *Deleted

## 2024-11-02 ENCOUNTER — Telehealth (INDEPENDENT_AMBULATORY_CARE_PROVIDER_SITE_OTHER): Admitting: Internal Medicine

## 2024-11-02 ENCOUNTER — Encounter: Payer: Self-pay | Admitting: Internal Medicine

## 2024-11-02 DIAGNOSIS — J209 Acute bronchitis, unspecified: Secondary | ICD-10-CM

## 2024-11-02 DIAGNOSIS — N401 Enlarged prostate with lower urinary tract symptoms: Secondary | ICD-10-CM

## 2024-11-02 DIAGNOSIS — F17210 Nicotine dependence, cigarettes, uncomplicated: Secondary | ICD-10-CM | POA: Diagnosis not present

## 2024-11-02 DIAGNOSIS — Z720 Tobacco use: Secondary | ICD-10-CM

## 2024-11-02 MED ORDER — ALBUTEROL SULFATE HFA 108 (90 BASE) MCG/ACT IN AERS: 2.0000 | INHALATION_SPRAY | Freq: Four times a day (QID) | RESPIRATORY_TRACT | 0 refills | Status: AC | PRN

## 2024-11-02 MED ORDER — BENZONATATE 100 MG PO CAPS: 100.0000 mg | ORAL_CAPSULE | Freq: Two times a day (BID) | ORAL | 0 refills | Status: AC | PRN

## 2024-11-02 MED ORDER — AMOXICILLIN-POT CLAVULANATE 875-125 MG PO TABS: 1.0000 | ORAL_TABLET | Freq: Two times a day (BID) | ORAL | 0 refills | Status: DC

## 2024-11-02 MED ORDER — AZITHROMYCIN 250 MG PO TABS: ORAL_TABLET | ORAL | 0 refills | Status: AC

## 2024-11-02 MED ORDER — METHYLPREDNISOLONE 4 MG PO TBPK: ORAL_TABLET | ORAL | 0 refills | Status: AC
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
## 2024-11-02 NOTE — Patient Instructions (Addendum)
 Please start taking azithromycin  and prednisone  as prescribed.  Please take Tessalon  as needed for cough.  Okay to take Mucinex  or Robitussin as needed for cough as well.  Please use albuterol  inhaler as needed for shortness of breath or wheezing.  Please avoid smoking.
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
## 2025-01-13 ENCOUNTER — Ambulatory Visit: Admitting: Internal Medicine
